# Patient Record
Sex: Female | Born: 1955 | Race: White | Hispanic: No | Marital: Single | Smoking: Current every day smoker
Health system: Southern US, Community
[De-identification: ages and names within clinical notes are randomized; demographics above are authoritative.]

## PROBLEM LIST (undated history)

## (undated) DIAGNOSIS — J45909 Unspecified asthma, uncomplicated: Secondary | ICD-10-CM

## (undated) DIAGNOSIS — J449 Chronic obstructive pulmonary disease, unspecified: Secondary | ICD-10-CM

## (undated) DIAGNOSIS — J309 Allergic rhinitis, unspecified: Secondary | ICD-10-CM

## (undated) DIAGNOSIS — I10 Essential (primary) hypertension: Secondary | ICD-10-CM

## (undated) HISTORY — DX: Unspecified asthma, uncomplicated: J45.909

## (undated) HISTORY — DX: Essential (primary) hypertension: I10

## (undated) HISTORY — DX: Allergic rhinitis, unspecified: J30.9

## (undated) HISTORY — DX: Chronic obstructive pulmonary disease, unspecified: J44.9

## (undated) HISTORY — PX: TONSILLECTOMY: SUR1361

## (undated) HISTORY — PX: UMBILICAL HERNIA REPAIR: SHX196

## (undated) HISTORY — PX: BACK SURGERY: SHX140

## (undated) HISTORY — PX: APPENDECTOMY: SHX54

## (undated) HISTORY — PX: DENTAL SURGERY: SHX609

## (undated) HISTORY — PX: ABDOMINAL HYSTERECTOMY: SUR658

---

## 2012-10-12 DIAGNOSIS — M542 Cervicalgia: Secondary | ICD-10-CM | POA: Insufficient documentation

## 2012-10-12 DIAGNOSIS — E785 Hyperlipidemia, unspecified: Secondary | ICD-10-CM | POA: Insufficient documentation

## 2012-10-12 DIAGNOSIS — R591 Generalized enlarged lymph nodes: Secondary | ICD-10-CM | POA: Insufficient documentation

## 2012-10-12 DIAGNOSIS — M25519 Pain in unspecified shoulder: Secondary | ICD-10-CM | POA: Insufficient documentation

## 2012-10-12 DIAGNOSIS — J439 Emphysema, unspecified: Secondary | ICD-10-CM | POA: Insufficient documentation

## 2013-05-28 DIAGNOSIS — M5137 Other intervertebral disc degeneration, lumbosacral region: Secondary | ICD-10-CM | POA: Insufficient documentation

## 2013-05-28 DIAGNOSIS — I10 Essential (primary) hypertension: Secondary | ICD-10-CM | POA: Insufficient documentation

## 2013-05-28 DIAGNOSIS — D839 Common variable immunodeficiency, unspecified: Secondary | ICD-10-CM | POA: Insufficient documentation

## 2013-08-19 DIAGNOSIS — J449 Chronic obstructive pulmonary disease, unspecified: Secondary | ICD-10-CM | POA: Insufficient documentation

## 2013-08-19 DIAGNOSIS — J45909 Unspecified asthma, uncomplicated: Secondary | ICD-10-CM | POA: Insufficient documentation

## 2014-01-27 DIAGNOSIS — M533 Sacrococcygeal disorders, not elsewhere classified: Secondary | ICD-10-CM | POA: Insufficient documentation

## 2014-11-25 DIAGNOSIS — M25562 Pain in left knee: Secondary | ICD-10-CM | POA: Insufficient documentation

## 2015-05-14 ENCOUNTER — Telehealth: Payer: Self-pay | Admitting: Pediatrics

## 2015-05-14 NOTE — Telephone Encounter (Signed)
Pt is having issues with breathing machine ordered for home. Needs to order a new one before this weekend hits. Please call pt back.

## 2015-06-04 ENCOUNTER — Other Ambulatory Visit: Payer: Self-pay

## 2015-06-04 MED ORDER — OLOPATADINE HCL 0.2 % OP SOLN: 1.0000 [drp] | OPHTHALMIC | Status: DC

## 2015-06-21 ENCOUNTER — Other Ambulatory Visit: Payer: Self-pay | Admitting: Allergy

## 2015-06-21 MED ORDER — AZELASTINE HCL 0.15 % NA SOLN: 1.0000 | NASAL | Status: DC

## 2015-07-01 ENCOUNTER — Other Ambulatory Visit: Payer: Self-pay | Admitting: Allergy

## 2015-07-01 MED ORDER — AZELASTINE HCL 0.15 % NA SOLN: 1.0000 | NASAL | Status: DC

## 2015-08-18 DIAGNOSIS — M5416 Radiculopathy, lumbar region: Secondary | ICD-10-CM | POA: Insufficient documentation

## 2015-12-20 ENCOUNTER — Telehealth: Payer: Self-pay | Admitting: Allergy

## 2015-12-20 NOTE — Telephone Encounter (Signed)
a 

## 2015-12-20 NOTE — Telephone Encounter (Signed)
[facility] CALLED AND SAID Abigaile NEEDED HER AZELASTINE NASAL SPRAY REFILLED. O.K.WITH ONE REFILL

## 2016-02-24 ENCOUNTER — Other Ambulatory Visit: Payer: Self-pay

## 2016-11-21 ENCOUNTER — Ambulatory Visit (INDEPENDENT_AMBULATORY_CARE_PROVIDER_SITE_OTHER): Payer: Medicaid Other | Admitting: Pediatrics

## 2016-11-21 ENCOUNTER — Encounter: Payer: Self-pay | Admitting: Pediatrics

## 2016-11-21 VITALS — BP 150/82 | HR 82 | Temp 97.8°F | Resp 16 | Ht 61.5 in | Wt 141.3 lb

## 2016-11-21 DIAGNOSIS — J411 Mucopurulent chronic bronchitis: Secondary | ICD-10-CM

## 2016-11-21 DIAGNOSIS — J310 Chronic rhinitis: Secondary | ICD-10-CM | POA: Diagnosis not present

## 2016-11-21 DIAGNOSIS — J410 Simple chronic bronchitis: Secondary | ICD-10-CM

## 2016-11-21 DIAGNOSIS — K219 Gastro-esophageal reflux disease without esophagitis: Secondary | ICD-10-CM | POA: Diagnosis not present

## 2016-11-21 DIAGNOSIS — Z720 Tobacco use: Secondary | ICD-10-CM | POA: Diagnosis not present

## 2016-11-21 DIAGNOSIS — D839 Common variable immunodeficiency, unspecified: Secondary | ICD-10-CM

## 2016-11-21 MED ORDER — CETIRIZINE HCL 10 MG PO TABS: ORAL_TABLET | ORAL | 5 refills | Status: DC

## 2016-11-21 MED ORDER — OLOPATADINE HCL 0.1 % OP SOLN: OPHTHALMIC | 5 refills | Status: DC

## 2016-11-21 MED ORDER — FLUTICASONE PROPIONATE 50 MCG/ACT NA SUSP: NASAL | 5 refills | Status: DC

## 2016-11-21 NOTE — Patient Instructions (Addendum)
Cetirizine 10 mg-take 1 tablet once a day for runny nose Fluticasone 2 sprays per nostril once a day for stuffy nose Patanol 1 drop twice a day if needed for itchy eyes Continue on your COPD medications prescribed by your  pulmonologist Hizentra 20%-infuse  7 grams  (35 ML) once a week Call me if you're allergic symptoms are not better Stop smoking cigarettes

## 2016-11-21 NOTE — Progress Notes (Signed)
[address] [place] [id number] Dept: [phone]  FOLLOW UP NOTE  Patient ID: Kristi Greathouseammy Allen, female    DOB: 03/18/1956  Age: 61 y.o. MRN: [id number] Date of Office Visit: 11/21/2016  Assessment  Chief Complaint: Asthma (chest tightness, coughing, some watery eyes and throat irritation)  HPI Carey Loder presents for follow-up of a COPD and low immunoglobulins.. She is followed by Dr. Eulis FosterBeauford for her COPD. At times she has nasal congestion and itchy eyes. Because of low immunoglobulins and recurrent ear infections, she has been on Hyzentra once a week and the recurrent  infections have improved dramatically. She has CVID  She continues to smoke cigarettes but is trying to quit  Current medications are outlined in the chart   Drug Allergies:  Allergies  Allergen Reactions  . Cyclobenzaprine   . Sulfamethoxazole-Trimethoprim Swelling  . Codeine Rash    Other reaction(s): RASH    Physical Exam: BP (!) 150/82   Pulse 82   Temp 97.8 F (36.6 C) (Oral)   Resp 16   Ht 5' 1.5" (1.562 m)   Wt 141 lb 5 oz (64.1 kg)   SpO2 96%   BMI 26.27 kg/m    Physical Exam  Constitutional: She is oriented to person, place, and time. She appears well-developed and well-nourished.  HENT:  Eyes normal. Ears normal. Nose mild swelling of the turbinates. Pharynx normal.  Neck: Neck supple.  Cardiovascular:  S1 and S2 normal no murmurs  Pulmonary/Chest:  Clear to percussion and auscultation except for mild wheezing on end expiration  Lymphadenopathy:    She has no cervical adenopathy.  Neurological: She is alert and oriented to person, place, and time.  Psychiatric: She has a normal mood and affect. Her behavior is normal. Judgment and thought content normal.  Vitals reviewed.   Diagnostics:  FVC 1.68 L FEV1 1.61 L. Predicted FVC 3.04 L predicted FEV1 2.25 L-this shows a moderate reduction in the forced vital capacity. There is no airway obstruction  Assessment and Plan: 1.  Mucopurulent chronic bronchitis (HCC)   2. Simple chronic bronchitis (HCC)   3. Common variable immunodeficiency (HCC)   4. Tobacco use   5. Chronic rhinitis   6. Gastroesophageal reflux disease without esophagitis     Meds ordered this encounter  Medications  . fluticasone (FLONASE) 50 MCG/ACT nasal spray    Sig: 2 sprays per nostril once a day for stuffy nose    Dispense:  18.2 g    Refill:  5  . olopatadine (PATANOL) 0.1 % ophthalmic solution    Sig: 1 drop twice a day if needed for itchy eyes    Dispense:  5 mL    Refill:  5  . cetirizine (ZYRTEC) 10 MG tablet    Sig: Take 1 tablet once a day for runny nose    Dispense:  34 tablet    Refill:  5    Patient Instructions  Cetirizine 10 mg-take 1 tablet once a day for runny nose Fluticasone 2 sprays per nostril once a day for stuffy nose Patanol 1 drop twice a day if needed for itchy eyes Continue on your COPD medications prescribed by your  pulmonologist Hizentra 20%-infuse  7 grams  (35 ML) once a week Call me if you're allergic symptoms are not better Stop smoking cigarettes   Return in about 1 year (around 11/21/2017).    Thank you for the opportunity to care for this patient.  Please do not hesitate to contact me with questions.  Penne Lash, M.D.  [facility] [address] [place], [place] [address] [phone]

## 2016-11-22 ENCOUNTER — Telehealth: Payer: Self-pay | Admitting: *Deleted

## 2016-11-22 NOTE — Telephone Encounter (Signed)
[facility] reached at [phone]

## 2016-11-22 NOTE — Telephone Encounter (Signed)
Per Dr. Beaulah DinningBardelas called [facility] and renewed her Hizentra 20% medication, 7 grams once a week with refills for 1 year and shipment is set for this Friday. Patient was informed of this.

## 2016-12-01 ENCOUNTER — Ambulatory Visit: Payer: Self-pay | Admitting: Allergy & Immunology

## 2017-05-14 ENCOUNTER — Other Ambulatory Visit: Payer: Self-pay | Admitting: Pediatrics

## 2017-07-18 ENCOUNTER — Institutional Professional Consult (permissible substitution): Payer: Self-pay | Admitting: Internal Medicine

## 2017-07-23 ENCOUNTER — Institutional Professional Consult (permissible substitution): Payer: Self-pay | Admitting: Pulmonary Disease

## 2017-08-09 ENCOUNTER — Encounter: Payer: Self-pay | Admitting: Pulmonary Disease

## 2017-08-09 ENCOUNTER — Ambulatory Visit (INDEPENDENT_AMBULATORY_CARE_PROVIDER_SITE_OTHER): Payer: Medicaid Other | Admitting: Pulmonary Disease

## 2017-08-09 VITALS — BP 138/78 | HR 86 | Ht 63.0 in | Wt 133.0 lb

## 2017-08-09 DIAGNOSIS — J479 Bronchiectasis, uncomplicated: Secondary | ICD-10-CM

## 2017-08-09 DIAGNOSIS — D839 Common variable immunodeficiency, unspecified: Secondary | ICD-10-CM | POA: Diagnosis not present

## 2017-08-09 DIAGNOSIS — J410 Simple chronic bronchitis: Secondary | ICD-10-CM | POA: Diagnosis not present

## 2017-08-09 DIAGNOSIS — J411 Mucopurulent chronic bronchitis: Secondary | ICD-10-CM | POA: Diagnosis not present

## 2017-08-09 MED ORDER — SODIUM CHLORIDE 3 % IN NEBU: INHALATION_SOLUTION | Freq: Two times a day (BID) | RESPIRATORY_TRACT | 11 refills | Status: DC

## 2017-08-09 MED ORDER — IPRATROPIUM-ALBUTEROL 20-100 MCG/ACT IN AERS: 1.0000 | INHALATION_SPRAY | Freq: Four times a day (QID) | RESPIRATORY_TRACT | 5 refills | Status: DC
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
## 2017-08-09 NOTE — Addendum Note (Signed)
Addended by: Velvet BatheAULFIELD, ASHLEY L on: 08/09/2017 02:48 PM   Modules accepted: Orders

## 2017-08-09 NOTE — Patient Instructions (Signed)
COPD-asthma overlap: Continue taking Symbicort 2 puffs twice a day Stop using the pro-air inhaler, stop using the Atrovent inhaler Start using Combivent 1 puff every 4-6 hours as needed for shortness of breath When you are at home you can substitute Combivent for the DuoNeb again, every 4-6 hours as needed for shortness of breath We will check blood work today to look for evidence of an underlying allergy (CBC with differential and serum IgE) We will check a chest x-ray today We will check lung function testing.  History of chronic variable immunodeficiency: Continue subcutaneous immunoglobulin supplementation We will check your mucus today for bacterial, fungal, AFB organism  Bronchiectasis: This term means that your airways are big, dilated, and susceptible to infection We are going to add hypertonic saline nebulized twice a day no matter how you feel to help you mobilize secretions.  Use this about 10 minutes after using the DuoNeb nebulizer  Cigarette smoker: Stop smoking Follow the lifestyle modification sheet we gave you. Call 1-800-quit-NOW to get free nicotine replacement from the state [place]  We will see you back in 6-8 weeks after the results of the culture are available

## 2017-08-13 ENCOUNTER — Telehealth: Payer: Self-pay | Admitting: Pulmonary Disease

## 2017-08-13 DIAGNOSIS — J410 Simple chronic bronchitis: Secondary | ICD-10-CM

## 2017-08-13 LAB — SPUTUM CYTOLOGY

## 2017-08-13 NOTE — Telephone Encounter (Signed)
LMTCB

## 2017-08-14 ENCOUNTER — Telehealth: Payer: Self-pay | Admitting: Pulmonary Disease

## 2017-08-14 DIAGNOSIS — J479 Bronchiectasis, uncomplicated: Secondary | ICD-10-CM

## 2017-08-14 MED ORDER — SODIUM CHLORIDE 3 % IN NEBU: INHALATION_SOLUTION | Freq: Two times a day (BID) | RESPIRATORY_TRACT | 11 refills | Status: DC

## 2017-08-14 NOTE — Telephone Encounter (Signed)
Patient called stating [facility] has had made her aware that they cannot provide nebulizer solution as well. Patient asks that we call her at [phone]. [facility] [address] [place]

## 2017-08-14 NOTE — Telephone Encounter (Signed)
Spoke with pt, she states she needs RX for hypertonic solution to go to [facility]. SHe also needed an order for a hose and filters for her nebulizer machine. Rx sent to the pharmacy and order placed to [facility]. Nothing further is needed.

## 2017-08-14 NOTE — Telephone Encounter (Signed)
Called Melissa from [facility] who stated to me pt received her last nebulizer from [facility].  Per Melissa, pt would have to go back to [facility] to receive her supplies for the nebulizer.  Melissa also stated to me the neb solution that was ordered for pt is one that is not supplied by [facility].  Attempted to call pt but no answer. Left message for pt to return our call x1.  Will await a call back from pt before we submit new orders for neb supplies to come from [facility] and also check with pt if she wants the neb sol to come from her pharmacy.

## 2017-08-15 NOTE — Telephone Encounter (Signed)
Patient returning call, [phone].

## 2017-08-16 ENCOUNTER — Telehealth: Payer: Self-pay | Admitting: Pulmonary Disease

## 2017-08-16 LAB — CBC WITH DIFFERENTIAL/PLATELET
BASOS ABS: 0 10*3/uL (ref 0.0–0.2)
Basos: 0 %
EOS (ABSOLUTE): 0.1 10*3/uL (ref 0.0–0.4)
EOS: 1 %
HEMATOCRIT: 43.8 % (ref 34.0–46.6)
HEMOGLOBIN: 14.6 g/dL (ref 11.1–15.9)
IMMATURE GRANS (ABS): 0 10*3/uL (ref 0.0–0.1)
Immature Granulocytes: 0 %
LYMPHS ABS: 2.1 10*3/uL (ref 0.7–3.1)
LYMPHS: 23 %
MCH: 30.1 pg (ref 26.6–33.0)
MCHC: 33.3 g/dL (ref 31.5–35.7)
MCV: 90 fL (ref 79–97)
MONOCYTES: 6 %
Monocytes Absolute: 0.6 10*3/uL (ref 0.1–0.9)
Neutrophils Absolute: 6.4 10*3/uL (ref 1.4–7.0)
Neutrophils: 70 %
Platelets: 348 10*3/uL (ref 150–379)
RBC: 4.85 x10E6/uL (ref 3.77–5.28)
RDW: 14 % (ref 12.3–15.4)
WBC: 9.3 10*3/uL (ref 3.4–10.8)

## 2017-08-16 LAB — IGE: IGE (IMMUNOGLOBULIN E), SERUM: 215 [IU]/mL (ref 6–495)

## 2017-08-16 MED ORDER — SODIUM CHLORIDE 3 % IN NEBU: INHALATION_SOLUTION | Freq: Two times a day (BID) | RESPIRATORY_TRACT | 11 refills | Status: DC

## 2017-08-16 NOTE — Telephone Encounter (Signed)
Called and spoke to patient. Let patient know that previous DME that provided nebulizer equipment was [facility] and that we would place order for that. Patient asked if hypertonic solution could be ordered so that it is delivered to her. Called and spoke to [facility]ist [facility] and was told they could mail patient the solution but it would take about a week to get to the patient. Patient stated that is fine with her and Rx [facility]. Nothing further is needed at this time.

## 2017-08-16 NOTE — Telephone Encounter (Signed)
Called and spoke with pt due to her returning [place] call.   Pt stated to me she called [facility] and was told by them they could not mail her nebulizer solution to her due to it being a liquid and she would have to pick it up.  Pt requested script to be sent to [facility] [place] due to it being closer to where she lives.  [facility]. Nothing further needed at this time.

## 2017-08-17 ENCOUNTER — Telehealth: Payer: Self-pay | Admitting: Pulmonary Disease

## 2017-08-17 ENCOUNTER — Other Ambulatory Visit: Payer: Self-pay

## 2017-08-17 MED ORDER — IPRATROPIUM BROMIDE 0.02 % IN SOLN: 1.0000 mg | Freq: Four times a day (QID) | RESPIRATORY_TRACT | 2 refills | Status: DC | PRN

## 2017-08-17 NOTE — Telephone Encounter (Signed)
Called and spoke with pt due to her returning [place] call.   Pt stated to me she called [facility] and was told by them they could not mail her nebulizer solution to her due to it being a liquid and she would have to pick it up.  Pt requested script [facility] be [facility] [place] due to it being closer to where she lives.  [facility]. Nothing further needed at this time.  Spoke with patient. She was returning Emily's call. Patient stated that she has been trying to get in touch with the pharmacy but they keep telling her that with her phone number, they do not have any RXs ready for her.   Advised patient that I would call [facility]. [facility] advised that they have 2 RXs for the patient.   Patient is aware. However, she needed the Atrovent solution to be called into [facility]. Advised her that I would send it in for her. She verbalized understanding. Nothing else needed at time of call.

## 2017-08-17 NOTE — Telephone Encounter (Signed)
Called and spoke with pt who stated to me she called [facility] yesterday and was told they had not received a Rx from our office for her.  I stated to pt I would call [facility] to see what the issue was and then call her back.  Called [facility] and spoke with Asher MuirJamie to see if an Rx had been received for pt and she stated to me an Rx was received today and they are in the process getting script ready for pt.    Called pt back to let her know this information.  Pt expressed understanding. Pt also stated to me she heard from [facility] and they are going to be delivering a new neb machine to her sometime between next Wednesday-Friday with new tubing which she hopes will work better for her than her current machine.  Stated to pt to call our office if anything else was needed. Nothing further needed at this time.

## 2017-08-21 ENCOUNTER — Telehealth: Payer: Self-pay | Admitting: Pulmonary Disease

## 2017-08-21 MED ORDER — BENZONATATE 100 MG PO CAPS: 100.0000 mg | ORAL_CAPSULE | Freq: Three times a day (TID) | ORAL | 2 refills | Status: DC | PRN

## 2017-08-21 NOTE — Telephone Encounter (Signed)
Called and spoke to pt.  Pt is requesting that Hypertonic be sent to [facility], as [facility] does not carry this medication.  Pt is also requesting Rx for Tessalon, as she has a persistent cough- prod with yellow mucus.  I have called [facility] to verify that they carry this medication and was advised that they do not.  Called pt back and made her aware of this information. Pt stated she would have to hold off on starting this medication, as [place] not drive [place] Rx.   BQ please advise on Rx for Tessalon.

## 2017-08-21 NOTE — Telephone Encounter (Signed)
OK to Rx tessalon 100 po tid prn cough, disp 45 refill 2

## 2017-08-21 NOTE — Telephone Encounter (Signed)
Rx for Tessalon 100 has been sent to preferred pharmacy. Pt is aware and voiced her understanding. Nothing further is needed.

## 2017-08-22 ENCOUNTER — Telehealth: Payer: Self-pay | Admitting: Pulmonary Disease

## 2017-08-22 NOTE — Telephone Encounter (Signed)
Attempted to contact [facility]. I did not receive an answer. There was no option for me to leave a message. Will try back.

## 2017-08-23 NOTE — Telephone Encounter (Signed)
Kim with [facility] returning call, [phone].

## 2017-08-23 NOTE — Telephone Encounter (Addendum)
I called [facility] but there was no answer and I left a detailed message advising them to return our call.   

## 2017-08-23 NOTE — Telephone Encounter (Signed)
I called [facility] but there was no answer and I left a detailed message advising them to return our call.

## 2017-08-26 LAB — FUNGUS CULTURE W SMEAR

## 2017-08-27 NOTE — Telephone Encounter (Signed)
[facility]. Pt's prescriptions have been updated. Nothing further was needed at this time.

## 2017-08-27 NOTE — Telephone Encounter (Signed)
[facility] returning call. Cb is [phone] before 12 [phone]. After 12 [phone]

## 2017-08-27 NOTE — Telephone Encounter (Signed)
Attempted to call [facility] I did not receive an answer. I have left a message for [facility] to return our call.

## 2017-09-24 LAB — AFB CULTURE WITH SMEAR (NOT AT ARMC)
ACID FAST CULTURE: NEGATIVE
ACID FAST SMEAR: NEGATIVE

## 2017-10-11 ENCOUNTER — Ambulatory Visit: Payer: Medicaid Other | Admitting: Pulmonary Disease

## 2017-10-11 ENCOUNTER — Encounter: Payer: Self-pay | Admitting: Pulmonary Disease

## 2017-10-11 ENCOUNTER — Telehealth: Payer: Self-pay | Admitting: Pulmonary Disease

## 2017-10-11 VITALS — BP 140/80 | HR 91 | Ht 62.0 in | Wt 131.0 lb

## 2017-10-11 DIAGNOSIS — D839 Common variable immunodeficiency, unspecified: Secondary | ICD-10-CM

## 2017-10-11 DIAGNOSIS — Z720 Tobacco use: Secondary | ICD-10-CM

## 2017-10-11 DIAGNOSIS — J471 Bronchiectasis with (acute) exacerbation: Secondary | ICD-10-CM

## 2017-10-11 DIAGNOSIS — J449 Chronic obstructive pulmonary disease, unspecified: Secondary | ICD-10-CM | POA: Diagnosis not present

## 2017-10-11 DIAGNOSIS — J411 Mucopurulent chronic bronchitis: Secondary | ICD-10-CM

## 2017-10-11 MED ORDER — BENZONATATE 200 MG PO CAPS: 200.0000 mg | ORAL_CAPSULE | Freq: Three times a day (TID) | ORAL | 2 refills | Status: DC | PRN

## 2017-10-11 MED ORDER — FLUTTER DEVI: 1.0000 | 0 refills | Status: DC

## 2017-10-11 MED ORDER — LORATADINE 10 MG PO TABS: 10.0000 mg | ORAL_TABLET | Freq: Every day | ORAL | 2 refills | Status: DC

## 2017-10-11 MED ORDER — AMOXICILLIN-POT CLAVULANATE 875-125 MG PO TABS: 1.0000 | ORAL_TABLET | Freq: Two times a day (BID) | ORAL | 0 refills | Status: DC

## 2017-10-11 NOTE — Patient Instructions (Signed)
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  The most significant problem is that you are continuing to smoke cigarettes. He desperately need to quit smoking Continue using Symbicort 2 puffs twice a day Use albuterol-ipratropium every 4-6 hours as needed for shortness of breath  Cigarette smoking: Stop smoking  Follow-up in 4 to 6 weeks to see how you are doing with the flutter valve, consider using a therapy vest if no improvement.

## 2017-10-11 NOTE — Progress Notes (Signed)
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  She continues to smoke cigarettes as of 08/2017.  She started smoking at age 62 and at one point she was smoking 4 packs per day, in April 2019 she was smoking 1.5-2 packs per day.  She took Chantix once that gave her severe mood swings so she stopped it.   She had been on allergy shots as recent as about 2017, this was complicated by "a severe reaction".  HPI  Chief Complaint  Patient presents with  . Follow-up    follow up for chronic bronchitis. patient complains of chronic cough with yellow mucus production. increased SOB. patient is still smoking 2 ppd   Still smoking 2 packs per day. Cough has worsened to brown mucus over the last few days.  Eliette says that the allergy medicine has helped her some but she is still having a lot of sinus congestion. She says that the allergy symptoms have been so bad that she has considered using benadryl as well.  She has been coughing and wheezing more lately because of this.  She has been coughing up mucus quite a bit and has been brining up thick mucus from her nose as well.  She has been using a lot of saline rinses.    [place].  She has been on allergy shots in the past but had to stop due a reaction to the immunotherapy.  She still sees them fairly regularly and last saw them a few weeks ago.    CVID: she is still taking the Ig infections weekly.   She still feels significant chest congestion and coughing up phlegm.  She has had this problem for years.  She hasn't picked up the hypertonic saline because she has panic attacks [place].  She hasn't tried a flutter.   Past Medical History:  Diagnosis Date  .  Asthma   . COPD (chronic obstructive pulmonary disease) (HCC)   . Hypertension        Review of Systems  Constitutional: Negative for fever and weight loss.  HENT: Positive for congestion. Negative for ear pain, nosebleeds and sore throat.   Eyes: Negative for redness.  Respiratory: Positive for cough, shortness of breath and wheezing.   Cardiovascular: Negative for palpitations, leg swelling and PND.  Gastrointestinal: Negative for nausea and vomiting.  Genitourinary: Negative for dysuria.  Skin: Negative for rash.  Neurological: Negative for headaches.  Endo/Heme/Allergies: Does not bruise/bleed easily.  Psychiatric/Behavioral: Negative for depression. The patient is not nervous/anxious.       Objective:  Physical Exam   Vitals:   10/11/17 1357  BP: 140/80  Pulse: 91  SpO2: 96%  Weight: 131 lb (59.4 kg)  Height: 5\' 2"  (1.575 m)    RA  Gen: chronically ill  appearing HENT: OP clear, TM's clear, neck supple PULM: Wheezing upper lobes bilaterally, normal percussion CV: RRR, no mgr, trace edema GI: BS+, soft, nontender Derm: no cyanosis or rash Psyche: normal mood and affect    CBC    Component Value Date/Time   WBC 9.3 08/09/2017 1450   RBC 4.85 08/09/2017 1450   HGB 14.6 08/09/2017 1450   HCT 43.8 08/09/2017 1450   PLT 348 08/09/2017 1450  MCV 90 08/09/2017 1450   MCH 30.1 08/09/2017 1450   MCHC 33.3 08/09/2017 1450   RDW 14.0 08/09/2017 1450   LYMPHSABS 2.1 08/09/2017 1450   EOSABS 0.1 08/09/2017 1450   BASOSABS 0.0 08/09/2017 1450     Chest imaging:  PFT: July [facility]: Normal appearing flow volume loop, FEV1 1.61 L 69% predicted 08/2017 spiro no airflow obstruction  Labs: 08/2017 CBC 100 absolute eos, IgE 215  Path: 08/2017 Sputum cytology negative   Micro: 08/2017 Sputum culture neg AFB; fungus: candida sp and penicillium  Echo:  Heart Catheterization:   Records from her visit with asthma  and allergy reviewed where she has been treated for COPD with chronic variable immunodeficiency with subcutaneous immunoglobulin    Assessment & Plan:   CVID (common variable immunodeficiency) (HCC)  Bronchiectasis with acute exacerbation (HCC) - Plan: CT CHEST HIGH RESOLUTION  Mucopurulent chronic bronchitis (HCC)  COPD with asthma (HCC)  Tobacco abuse  Discussion: Mekhia is experiencing another flare of her bronchiectasis.  She has been coughing up more brown mucus lately and has been feeling a bit more congested and short of breath.  On top of all this she is continuing to have significant sinus congestion and postnasal drip.  She unfortunately was unable to pick up the hypertonic saline.  She does not have a flutter valve.  I think ultimately she is going to need to use a therapy vest to help with mucociliary clearance but I like to try hypertonic saline as well as a flutter valve first.  We also need to get record of any imaging that has shown that she does not fact have bronchiectasis.  Plan: Bronchiectasis with acute exacerbation: Take Augmentin 875 1 tablet twice daily x7 days We will arrange for a high-resolution CT scan of your chest if we are unable to track down the results of the CT that showed you have bronchiectasis Try to have somebody take you into [place] to get the hypertonic saline prescription filled Use a flutter valve 10 breaths 3 times a day  Common variable immunodeficiency: Continue immunoglobulin supplementation by the asthma and allergy team  Allergic rhinitis: Switch from Zyrtec to Claritin 1 tablet daily Continue Singulair Continue nasal Flonase Continue saline rinses  COPD-asthma overlap syndrome: The blood work we did on the last visit did not show evidence of a severe allergy causing your problems.  The most significant problem is that you are continuing to smoke cigarettes. He desperately need to quit smoking Continue using Symbicort 2 puffs  twice a day Use albuterol-ipratropium every 4-6 hours as needed for shortness of breath  Cigarette smoking: Stop smoking  Follow-up in 4 to 6 weeks to see how you are doing with the flutter valve, consider using a therapy vest if no improvement.    Current Outpatient Medications:  .  albuterol (PROVENTIL HFA;VENTOLIN HFA) 108 (90 Base) MCG/ACT inhaler, Inhale 2 puffs into the lungs every 6 (six) hours as needed for wheezing or shortness of breath., Disp: , Rfl:  .  Azelastine HCl 0.15 % SOLN, Place 1 spray into both nostrils 1 day or 1 dose., Disp: 30 mL, Rfl: 3 .  benzonatate (TESSALON) 100 MG capsule, Take 1 capsule (100 mg total) by mouth 3 (three) times daily as needed for cough., Disp: 45 capsule, Rfl: 2 .  budesonide-formoterol (SYMBICORT) 160-4.5 MCG/ACT inhaler, Inhale 2 puffs into the lungs 2 (two) times daily., Disp: , Rfl:  .  cetirizine (ZYRTEC) 10 MG  tablet, Take 1 tablet once a day for runny nose, Disp: 34 tablet, Rfl: 5 .  fluticasone (FLONASE) 50 MCG/ACT nasal spray, 2 sprays per nostril once a day for stuffy nose, Disp: 18.2 g, Rfl: 5 .  hydrochlorothiazide (HYDRODIURIL) 25 MG tablet, Take 25 mg by mouth daily., Disp: , Rfl:  .  ipratropium (ATROVENT HFA) 17 MCG/ACT inhaler, Inhale 2 puffs into the lungs every 4 (four) hours as needed for wheezing., Disp: , Rfl:  .  ipratropium (ATROVENT) 0.02 % nebulizer solution, Inhale 5 mLs (1 mg total) into the lungs every 6 (six) hours as needed., Disp: 120 mL, Rfl: 2 .  Ipratropium-Albuterol (COMBIVENT RESPIMAT) 20-100 MCG/ACT AERS respimat, Inhale 1 puff into the lungs every 6 (six) hours., Disp: 4 g, Rfl: 5 .  loratadine (CLARITIN) 10 MG tablet, Take 10 mg by mouth daily., Disp: , Rfl:  .  montelukast (SINGULAIR) 10 MG tablet, Take 10 mg by mouth at bedtime., Disp: , Rfl:  .  nicotine (NICODERM CQ - DOSED IN MG/24 HOURS) 14 mg/24hr patch, Place 14 mg onto the skin daily., Disp: , Rfl:  .  olopatadine (PATANOL) 0.1 % ophthalmic  solution, PLACE 1 DROP IN EACH EYE 2 TIMES A DAY IF NEEDED FOR ITCHY EYES, Disp: 5 mL, Rfl: 4 .  Olopatadine HCl (PATADAY) 0.2 % SOLN, Place 1 drop into both eyes 1 day or 1 dose., Disp: 1 Bottle, Rfl: 5 .  omeprazole (PRILOSEC) 40 MG capsule, Take 40 mg by mouth daily., Disp: , Rfl:  .  simvastatin (ZOCOR) 80 MG tablet, Take 80 mg by mouth daily., Disp: , Rfl:  .  sodium chloride HYPERTONIC 3 % nebulizer solution, Take by nebulization 2 (two) times daily., Disp: 360 mL, Rfl: 11

## 2017-10-11 NOTE — Telephone Encounter (Signed)
Called and spoke to pt.  Pt stated tessalon 200mg  is too expensive. Per [facility]- Tessalon 100mg  is cheaper and can be written as 2 tabs q8h.  BQ please advise. Thanks

## 2017-10-12 MED ORDER — BENZONATATE 100 MG PO CAPS: ORAL_CAPSULE | ORAL | 2 refills | Status: DC

## 2017-10-12 NOTE — Telephone Encounter (Signed)
Pt is calling back. Pt also states she was switched Claritin but Claritin is no longer covered by her insurance. Cb is [phone].

## 2017-10-12 NOTE — Telephone Encounter (Signed)
Can take generic loratadine instead of claritin Tessalon 200 mg po q8h prn cough disp 45 refil 2

## 2017-10-12 NOTE — Telephone Encounter (Signed)
Per Dr. Kendrick FriesMcQuaid verbally- okay to do tessalon 100mg  2 tabs qh8 prn.  Rx for Tessalon 100mg  has been sent to preferred pharmacy.  Made pt aware of BQ's recommendations regarding generic Claritin.  Pt states she can not afford the generic either.  I have spoken with BQ verbally and made him aware, who suggested that if pt has a family member or friend that has a [employer], [employer] membership, she could get 300 tablets for $10.00. I also made pt aware that [facility] has 90 tablets for $5.00. Pt stated that neither one of these options were affordable for her either, and that she wished to stay on zyrtec.  routing to BQ as an FYI.

## 2017-10-12 NOTE — Telephone Encounter (Signed)
Pt is calling for update on tessalon dosage.  Pt also stated that claritin 10mg  is not covered by insurance and she can not afford this medication. She stated since Claritin is not affordable, she will continue with Zyrtec.   BQ please advise. Thanks.

## 2017-11-02 ENCOUNTER — Telehealth: Payer: Self-pay | Admitting: Pulmonary Disease

## 2017-11-02 ENCOUNTER — Other Ambulatory Visit: Payer: Self-pay | Admitting: Pulmonary Disease

## 2017-11-02 MED ORDER — IPRATROPIUM BROMIDE 0.02 % IN SOLN: RESPIRATORY_TRACT | 2 refills | Status: DC

## 2017-11-02 NOTE — Telephone Encounter (Signed)
Spoke with pt and advised rx for Atrovent sent to [facility]. Nothing further is needed.

## 2017-11-19 ENCOUNTER — Ambulatory Visit: Payer: Medicaid Other | Admitting: Pulmonary Disease

## 2017-11-20 ENCOUNTER — Ambulatory Visit: Payer: Medicaid Other | Admitting: Pediatrics

## 2017-11-21 ENCOUNTER — Telehealth: Payer: Self-pay | Admitting: Allergy

## 2017-11-21 ENCOUNTER — Other Ambulatory Visit: Payer: Self-pay | Admitting: Allergy

## 2017-11-21 NOTE — Telephone Encounter (Signed)
Done - error

## 2017-11-21 NOTE — Telephone Encounter (Signed)
Informed Kristi Allen I called in the Hizentra and to keep her appointment for Tuesday.

## 2017-11-21 NOTE — Telephone Encounter (Signed)
Talked with insurance company and the will ship the Hizentra  To [facility] for a month tomorrow.

## 2017-11-27 ENCOUNTER — Encounter: Payer: Self-pay | Admitting: Pediatrics

## 2017-11-27 ENCOUNTER — Ambulatory Visit: Payer: Medicaid Other | Admitting: Pediatrics

## 2017-11-27 VITALS — BP 152/78 | HR 86 | Temp 97.7°F | Resp 20 | Ht 61.0 in | Wt 128.0 lb

## 2017-11-27 DIAGNOSIS — K219 Gastro-esophageal reflux disease without esophagitis: Secondary | ICD-10-CM

## 2017-11-27 DIAGNOSIS — J4541 Moderate persistent asthma with (acute) exacerbation: Secondary | ICD-10-CM | POA: Diagnosis not present

## 2017-11-27 DIAGNOSIS — J411 Mucopurulent chronic bronchitis: Secondary | ICD-10-CM | POA: Diagnosis not present

## 2017-11-27 DIAGNOSIS — J0100 Acute maxillary sinusitis, unspecified: Secondary | ICD-10-CM | POA: Insufficient documentation

## 2017-11-27 DIAGNOSIS — Z720 Tobacco use: Secondary | ICD-10-CM | POA: Diagnosis not present

## 2017-11-27 DIAGNOSIS — D839 Common variable immunodeficiency, unspecified: Secondary | ICD-10-CM | POA: Diagnosis not present

## 2017-11-27 DIAGNOSIS — J310 Chronic rhinitis: Secondary | ICD-10-CM | POA: Insufficient documentation

## 2017-11-27 MED ORDER — PREDNISONE 10 MG PO TABS: ORAL_TABLET | ORAL | 0 refills | Status: DC

## 2017-11-27 MED ORDER — AMOXICILLIN-POT CLAVULANATE 875-125 MG PO TABS: ORAL_TABLET | ORAL | 0 refills | Status: DC

## 2017-11-27 MED ORDER — IPRATROPIUM BROMIDE 0.02 % IN SOLN: RESPIRATORY_TRACT | 2 refills | Status: DC

## 2017-11-27 NOTE — Patient Instructions (Addendum)
Cetirizine 10 mg-take 1 tablet once a day for runny nose Fluticasone 2 sprays per nostril after doing nasal saline irrigations at night Symbicort 160 - 2 puffs every 12 hours to prevent coughing or wheezing Ipratropium 0.02% 1 unit dose every 4 hours to help her breathing Pro-air 2 puffs every 4 hours if needed for wheezing or coughing spells or instead albuterol 0.083% 1 unit dose every 4 hours if needed Augmentin 875 mg-take 1 tablet every 12 hours for 10 days for a sinus infection Add prednisone 10 mg twice a day for 4 days, 10 mg on the fifth day Hizentra 20%- 7 grams once a week . Next dose tomorrow Continue on your other medications Stop smoking cigarettes  She will have serum immunoglobulins and IgG subclasses done today Continue your follow-up visits with your pulmonologist

## 2017-11-27 NOTE — Progress Notes (Signed)
[address] [place] [address] Dept: [phone]  FOLLOW UP NOTE  Patient ID: Kristi Allen, female    DOB: 09/04/1955  Age: 62 y.o. MRN: [id number] Date of Office Visit: 11/27/2017  Assessment  Chief Complaint: COPD (SOB.  Pt now seeing Dr. Max Fickleouglas McQuaid, a pulmonologist in [place]) and Allergic Rhinitis  (PND)  HPI Kristi Allen presents for follow-up of COPD with some reactive airways .  She is followed by pulmonologist in [place].  She has common variable immunodeficiency and is on Hizentra 7 g once a week.  She needs a new prescription to use for the next year.  She has improved with the use of Hizentra.  She has had a discolored postnasal drainage and more coughing over the past 2 weeks.  Over the past year she has noted more sneezing and nasal congestion.  She has not had allergy testing for several years  Current medications are outlined in the chart   Drug Allergies:  Allergies  Allergen Reactions  . Cyclobenzaprine   . Hydrocortisone     Breaks out in hives with topical cream.   . Sulfamethoxazole-Trimethoprim Swelling  . Codeine Rash    Other reaction(s): RASH    Physical Exam: BP (!) 152/78 (BP Location: Left Arm, Patient Position: Sitting, Cuff Size: Normal)   Pulse 86   Temp 97.7 F (36.5 C) (Oral)   Resp 20   Ht 5\' 1"  (1.549 m)   Wt 128 lb (58.1 kg)   SpO2 97%   BMI 24.19 kg/m    Physical Exam  Constitutional: She is oriented to person, place, and time. She appears well-developed and well-nourished.  HENT:  Eyes normal.  Ears normal..  Nose mild swelling of the  nasal turbinates.  Pharynx normal except for yellow postnasal drainage  Neck: Neck supple.  Cardiovascular:  S1 S2 normal no murmurs  Pulmonary/Chest:  Clear to percussion and auscultation except for some rhonchi in the left lung  Lymphadenopathy:    She has no cervical adenopathy.  Neurological: She is alert and oriented to person, place, and time.  Psychiatric: She has  a normal mood and affect. Her behavior is normal. Judgment and thought content normal.  Vitals reviewed.   Diagnostics: FVC 1.84 L FEV1 1.54 L.  Predicted FVC 2.92 L predicted FEV1 2.25 L.  After albuterol by nebulization FVC 2.10 L FEV1 1.80 L-this shows a moderate reduction in the forced vital capacity and FEV1 with significant improvement after albuterol  Assessment and Plan: 1. CVID (common variable immunodeficiency) (HCC)   2. Mucopurulent chronic bronchitis (HCC)   3. Moderate persistent asthma with acute exacerbation   4. Tobacco use   5. Chronic rhinitis   6. Gastroesophageal reflux disease without esophagitis   7. Acute non-recurrent maxillary sinusitis     Meds ordered this encounter  Medications  . ipratropium (ATROVENT) 0.02 % nebulizer solution    Sig: One unit dose in nebulizer every 4 hours as needed for cough or wheeze.    Dispense:  300 mL    Refill:  2    Dispense 120 unit vials  . amoxicillin-clavulanate (AUGMENTIN) 875-125 MG tablet    Sig: One tablet every 12 hours for 10 days for infection.    Dispense:  20 tablet    Refill:  0  . predniSONE (DELTASONE) 10 MG tablet    Sig: One tablet twice a day for 4 days, then one tablet on day 5, then stop.    Dispense:  9 tablet  Refill:  0    Patient Instructions  Cetirizine 10 mg-take 1 tablet once a day for runny nose Fluticasone 2 sprays per nostril after doing nasal saline irrigations at night Symbicort 160 - 2 puffs every 12 hours to prevent coughing or wheezing Ipratropium 0.02% 1 unit dose every 4 hours to help her breathing Pro-air 2 puffs every 4 hours if needed for wheezing or coughing spells or instead albuterol 0.083% 1 unit dose every 4 hours if needed Augmentin 875 mg-take 1 tablet every 12 hours for 10 days for a sinus infection Add prednisone 10 mg twice a day for 4 days, 10 mg on the fifth day Hizentra 20%- 7 grams once a week . Next dose tomorrow Continue on your other medications Stop  smoking cigarettes  She will have serum immunoglobulins and IgG subclasses done today Continue your follow-up visits with your pulmonologist   Return in about 1 year (around 11/28/2018).    Thank you for the opportunity to care for this patient.  Please do not hesitate to contact me with questions.  Tonette Bihari, M.D.  [facility] [address] [place], [place] [address] [phone]

## 2017-11-29 ENCOUNTER — Ambulatory Visit: Payer: Medicaid Other | Admitting: Pulmonary Disease

## 2017-11-29 LAB — IGG, IGA, IGM
IgA/Immunoglobulin A, Serum: 119 mg/dL (ref 87–352)
IgM (Immunoglobulin M), Srm: 47 mg/dL (ref 26–217)

## 2017-11-29 LAB — IGG 1, 2, 3, AND 4
IGG 4: 12 mg/dL (ref 2–96)
IGG, SUBCLASS 3: 27 mg/dL (ref 15–102)
IgG (Immunoglobin G), Serum: 986 mg/dL (ref 700–1600)
IgG, Subclass 1: 572 mg/dL (ref 248–810)
IgG, Subclass 2: 279 mg/dL (ref 130–555)

## 2017-12-03 ENCOUNTER — Other Ambulatory Visit: Payer: Self-pay

## 2017-12-12 ENCOUNTER — Encounter (HOSPITAL_BASED_OUTPATIENT_CLINIC_OR_DEPARTMENT_OTHER): Payer: Self-pay | Admitting: Radiology

## 2017-12-12 ENCOUNTER — Ambulatory Visit (HOSPITAL_BASED_OUTPATIENT_CLINIC_OR_DEPARTMENT_OTHER)
Admission: RE | Admit: 2017-12-12 | Discharge: 2017-12-12 | Disposition: A | Discharge status: Home / Self Care | Payer: Medicaid Other | Source: Ambulatory Visit | Attending: Pulmonary Disease | Admitting: Pulmonary Disease

## 2017-12-12 DIAGNOSIS — I059 Rheumatic mitral valve disease, unspecified: Secondary | ICD-10-CM | POA: Insufficient documentation

## 2017-12-12 DIAGNOSIS — I2510 Atherosclerotic heart disease of native coronary artery without angina pectoris: Secondary | ICD-10-CM | POA: Insufficient documentation

## 2017-12-12 DIAGNOSIS — J471 Bronchiectasis with (acute) exacerbation: Secondary | ICD-10-CM | POA: Diagnosis not present

## 2017-12-12 DIAGNOSIS — I700 Atherosclerosis of aorta: Secondary | ICD-10-CM | POA: Diagnosis not present

## 2017-12-12 DIAGNOSIS — R918 Other nonspecific abnormal finding of lung field: Secondary | ICD-10-CM | POA: Insufficient documentation

## 2017-12-13 ENCOUNTER — Ambulatory Visit (INDEPENDENT_AMBULATORY_CARE_PROVIDER_SITE_OTHER): Payer: Medicaid Other | Admitting: Pulmonary Disease

## 2017-12-13 ENCOUNTER — Telehealth: Payer: Self-pay | Admitting: Pulmonary Disease

## 2017-12-13 ENCOUNTER — Encounter: Payer: Self-pay | Admitting: Pulmonary Disease

## 2017-12-13 VITALS — BP 140/80 | HR 83 | Ht 62.0 in | Wt 127.0 lb

## 2017-12-13 DIAGNOSIS — D839 Common variable immunodeficiency, unspecified: Secondary | ICD-10-CM | POA: Diagnosis not present

## 2017-12-13 DIAGNOSIS — J449 Chronic obstructive pulmonary disease, unspecified: Secondary | ICD-10-CM

## 2017-12-13 DIAGNOSIS — J471 Bronchiectasis with (acute) exacerbation: Secondary | ICD-10-CM

## 2017-12-13 DIAGNOSIS — R911 Solitary pulmonary nodule: Secondary | ICD-10-CM

## 2017-12-13 MED ORDER — IPRATROPIUM-ALBUTEROL 0.5-2.5 (3) MG/3ML IN SOLN: 3.0000 mL | Freq: Four times a day (QID) | RESPIRATORY_TRACT | 5 refills | Status: DC | PRN

## 2017-12-13 NOTE — Addendum Note (Signed)
Addended by: Etheleen MayhewOX, HEATHER C on: 12/13/2017 03:11 PM   Modules accepted: Orders

## 2017-12-13 NOTE — Telephone Encounter (Signed)
Spoke to patient, she had an appointment with [place]. Kendrick [place]aid today [place] where results were reviewed. Nothing further needed.

## 2017-12-13 NOTE — Progress Notes (Signed)
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  She continues to smoke cigarettes as of 08/2017.  She started smoking at age 89 and at one point she was smoking 4 packs per day, in April 2019 she was smoking 1.5-2 packs per day.  She took Chantix once that gave her severe mood swings so she stopped it.   She had been on allergy shots as recent as about 2017, this was complicated by "a severe reaction".  HPI  Chief Complaint  Patient presents with  . Follow-up    shortness of breath that causes anxiety attacks. cough   Kristi Allen says that she had a rough morning.  She had a skin cancer removed and had some stitches removed this morning and that area hurts.  She says that she has been having some panic attacks which make her short of breath. She says that she sometimes gets chest tightness when she takes the anxiety attackes.    She says that she recently had a sinus infection and is taking augmentin.  She has been using combivent and it helps "a whole lot".  She is using it about every 6 hours.  She does produce mucus when she uses the flutter valve. She has been using it regularly.  She says that the hypertonic saline really makes her breathe better.    She is still taking symbicort regularly.    She says that she is still smoking: her anxiety is contributing.   Past Medical History:  Diagnosis Date  . Allergic rhinitis   . Asthma   . COPD (chronic obstructive pulmonary disease) (HCC)   . Hypertension        Review of Systems  Constitutional: Negative for fever and weight loss.  HENT: Positive for congestion. Negative for ear pain, nosebleeds and sore throat.   Eyes: Negative for redness.  Respiratory: Positive for cough, shortness of breath and wheezing.     Cardiovascular: Negative for palpitations, leg swelling and PND.  Gastrointestinal: Negative for nausea and vomiting.  Genitourinary: Negative for dysuria.  Skin: Negative for rash.  Neurological: Negative for headaches.  Endo/Heme/Allergies: Does not bruise/bleed easily.  Psychiatric/Behavioral: Negative for depression. The patient is not nervous/anxious.       Objective:  Physical Exam   Vitals:   12/13/17 1435  BP: 140/80  Pulse: 83  SpO2: 97%  Weight: 127 lb (57.6 kg)  Height: 5\' 2"  (1.575 m)    RA  Gen: well appearing HENT: OP clear, TM's clear, neck supple PULM: Rhonchi bases bilaterally B, normal percussion CV: RRR, no mgr, trace edema GI: BS+, soft, nontender Derm: no cyanosis or rash Psyche: normal mood and affect     CBC    Component Value Date/Time   WBC 9.3 08/09/2017 1450   RBC 4.85 08/09/2017 1450   HGB 14.6 08/09/2017 1450   HCT 43.8 08/09/2017 1450   PLT 348 08/09/2017 1450   MCV 90 08/09/2017 1450   MCH 30.1 08/09/2017 1450   MCHC 33.3 08/09/2017 1450   RDW 14.0 08/09/2017 1450   LYMPHSABS 2.1 08/09/2017 1450   EOSABS 0.1 08/09/2017 1450   BASOSABS 0.0 08/09/2017 1450     Chest imaging: December 12, 2017 high-resolution CT scan of the chest images independently reviewed showing normal pulmonary parenchyma, scattered pulmonary nodules,  2 to 3 mm in size, perhaps very mild airway enlargement in the bases suggestive of bronchiectasis  PFT: July [facility]: Normal appearing flow volume loop, FEV1 1.61 L 69% predicted 08/2017 spiro no airflow obstruction  Labs: 08/2017 CBC 100 absolute eos, IgE 215  Path: 08/2017 Sputum cytology negative   Micro: 08/2017 Sputum culture neg AFB; fungus: candida sp and penicillium  Echo:  Heart Catheterization:       Assessment & Plan:   CVID (common variable immunodeficiency) (HCC)  Solitary pulmonary nodule - Plan: CT Chest Wo Contrast  Bronchiectasis  with acute exacerbation (HCC)  COPD with asthma (HCC)  Pulmonary nodule  Discussion: [name] says that she feels better after she has been doing the nebulized breathing treatments, I think she is referring mostly to the DuoNeb but I have encouraged her to continue pulmonary toilet measures with the flutter valve and the hypertonic saline.  Unfortunately she continues to smoke.  She needs to quit desperately.  These 2 small nodules in her lungs are not terribly worrisome given their size but considering her smoking history they need to be followed.  Honestly, I did not see dramatic bronchiectatic changes, but considering her clinical scenario with common variable immunodeficiency I think is very reasonable to continue treating as if she has it.  Plan: Common variable immunodeficiency with bronchiectasis: Keep follow-up with the immunologist Continue flutter valve 4 to 5 breaths, 4-5 times a day Continue hypertonic saline nebulized twice a day Use the DuoNeb 3 times a day  COPD-asthma overlap syndrome: Stop smoking Stop smoking Stop smoking Use the Symbicort 2 puffs twice a day no matter how you feel  Cigarette smoking: Stop smoking  Pulmonary nodule: We will repeat a CT scan of your chest in 12 months or sooner if needed  Follow up here in 4 months     Current Outpatient Medications:  .  albuterol (PROVENTIL HFA;VENTOLIN HFA) 108 (90 Base) MCG/ACT inhaler, Inhale 2 puffs into the lungs every 6 (six) hours as needed for wheezing or shortness of breath., Disp: , Rfl:  .  amoxicillin-clavulanate (AUGMENTIN) 875-125 MG tablet, One tablet every 12 hours for 10 days for infection., Disp: 20 tablet, Rfl: 0 .  Azelastine HCl 0.15 % SOLN, Place 1 spray into both nostrils 1 day or 1 dose., Disp: 30 mL, Rfl: 3 .  benzonatate (TESSALON) 100 MG capsule, Take 2 tabs every 8 hr as needed., Disp: 45 capsule, Rfl: 2 .  budesonide-formoterol (SYMBICORT) 160-4.5 MCG/ACT inhaler, Inhale 2 puffs into  the lungs 2 (two) times daily., Disp: , Rfl:  .  cetirizine (ZYRTEC) 10 MG tablet, Take 1 tablet once a day for runny nose, Disp: 34 tablet, Rfl: 5 .  diazepam (VALIUM) 5 MG tablet, TAKE 1 TO 2 TABLETS BY MOUTH 2 HOURS BEFORE AND AFTER SURGERY, Disp: , Rfl: 0 .  fluticasone (FLONASE) 50 MCG/ACT nasal spray, 2 sprays per nostril once a day for stuffy nose, Disp: 18.2 g, Rfl: 5 .  hydrochlorothiazide (HYDRODIURIL) 25 MG tablet, Take 25 mg by mouth daily., Disp: , Rfl:  .  Immune Globulin, Human, (HIZENTRA [place]), Inject 7 g into the skin once a week. Hizentra 20%  7 grams (35 ml) one time every week., Disp: , Rfl:  .  Ipratropium-Albuterol (COMBIVENT RESPIMAT) 20-100 MCG/ACT AERS respimat, Inhale 1 puff into the lungs every 6 (six) hours., Disp: 4 g, Rfl: 5 .  loratadine (CLARITIN) 10 MG tablet, Take 1 tablet (10 mg total)  by mouth daily., Disp: 30 tablet, Rfl: 2 .  nicotine (NICODERM CQ - DOSED IN MG/24 HOURS) 14 mg/24hr patch, Place 14 mg onto the skin daily., Disp: , Rfl:  .  olopatadine (PATANOL) 0.1 % ophthalmic solution, PLACE 1 DROP IN EACH EYE 2 TIMES A DAY IF NEEDED FOR ITCHY EYES, Disp: 5 mL, Rfl: 4 .  Olopatadine HCl (PATADAY) 0.2 % SOLN, Place 1 drop into both eyes 1 day or 1 dose., Disp: 1 Bottle, Rfl: 5 .  omeprazole (PRILOSEC) 40 MG capsule, Take 40 mg by mouth daily., Disp: , Rfl:  .  predniSONE (DELTASONE) 10 MG tablet, One tablet twice a day for 4 days, then one tablet on day 5, then stop., Disp: 9 tablet, Rfl: 0 .  Respiratory Therapy Supplies (FLUTTER) DEVI, 1 Device by Does not apply route as directed., Disp: 1 each, Rfl: 0 .  simvastatin (ZOCOR) 80 MG tablet, Take 80 mg by mouth daily., Disp: , Rfl:  .  sodium chloride HYPERTONIC 3 % nebulizer solution, Take by nebulization 2 (two) times daily., Disp: 360 mL, Rfl: 11 .  traMADol (ULTRAM) 50 MG tablet, Take 50 mg by mouth every 6 (six) hours as needed. for pain, Disp: , Rfl: 0 .  ipratropium-albuterol (DUONEB) 0.5-2.5 (3) MG/3ML  SOLN, Take 3 mLs by nebulization every 6 (six) hours as needed., Disp: 360 mL, Rfl: 5

## 2017-12-13 NOTE — Patient Instructions (Signed)
Common variable immunodeficiency with bronchiectasis: Keep follow-up with the immunologist Continue flutter valve 4 to 5 breaths, 4-5 times a day Continue hypertonic saline nebulized twice a day Use the DuoNeb 3 times a day  COPD-asthma overlap syndrome: Stop smoking Stop smoking Stop smoking Use the Symbicort 2 puffs twice a day no matter how you feel  Cigarette smoking: Stop smoking  Pulmonary nodule: We will repeat a CT scan of your chest in 12 months or sooner if needed  Follow up here in 4 months

## 2018-02-01 ENCOUNTER — Other Ambulatory Visit: Payer: Self-pay | Admitting: Pulmonary Disease

## 2018-03-01 ENCOUNTER — Telehealth: Payer: Self-pay

## 2018-03-01 NOTE — Telephone Encounter (Signed)
Joe from [facility] calling to let us know that Kristi Allen will be receiving her infusion just a  few days later than usual bc she had her flu shot the latter part of last week or early part of this week. She then had some skin cancer removed from off her leg so this week she didn't feel up to receiving her infusion. Joe from [facility] thought that Dr. Beaulah Dinning should be aware of this.

## 2018-03-04 NOTE — Telephone Encounter (Signed)
Thanks for letting me know!

## 2018-04-23 ENCOUNTER — Ambulatory Visit (INDEPENDENT_AMBULATORY_CARE_PROVIDER_SITE_OTHER): Payer: Medicaid Other | Admitting: Pulmonary Disease

## 2018-04-23 ENCOUNTER — Encounter: Payer: Self-pay | Admitting: Pulmonary Disease

## 2018-04-23 ENCOUNTER — Other Ambulatory Visit: Payer: Self-pay | Admitting: Pulmonary Disease

## 2018-04-23 ENCOUNTER — Ambulatory Visit: Payer: Medicaid Other | Admitting: Pulmonary Disease

## 2018-04-23 VITALS — BP 143/72 | HR 78 | Ht 62.0 in | Wt 131.0 lb

## 2018-04-23 DIAGNOSIS — J449 Chronic obstructive pulmonary disease, unspecified: Secondary | ICD-10-CM

## 2018-04-23 DIAGNOSIS — J471 Bronchiectasis with (acute) exacerbation: Secondary | ICD-10-CM

## 2018-04-23 DIAGNOSIS — Z720 Tobacco use: Secondary | ICD-10-CM

## 2018-04-23 DIAGNOSIS — F17210 Nicotine dependence, cigarettes, uncomplicated: Secondary | ICD-10-CM

## 2018-04-23 MED ORDER — PREDNISONE 20 MG PO TABS: 20.0000 mg | ORAL_TABLET | Freq: Every day | ORAL | 0 refills | Status: DC

## 2018-04-23 MED ORDER — NICOTINE 10 MG IN INHA: 1.0000 | RESPIRATORY_TRACT | 0 refills | Status: DC | PRN

## 2018-04-23 NOTE — Patient Instructions (Signed)
COPD-asthma overlap syndrome with acute exacerbation: Finish the antibiotics prescribed by your primary care physician Take prednisone 20 mg daily x5 days Continue using albuterol as needed for chest tightness, wheezing, shortness of breath Continue taking Symbicort daily  It is important to stop smoking right away.  However, if you continue to have recurrent exacerbations we may need to consider putting you on azithromycin every day.  Tobacco abuse: Stop smoking We will prescribe a Nicotrol inhaler to help you quit smoking  We will see you back in 2 to 3 months or sooner if needed

## 2018-04-23 NOTE — Progress Notes (Signed)
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  She continues to smoke cigarettes as of 08/2017.  She started smoking at age 62 and at one point she was smoking 4 packs per day, in April 2019 she was smoking 1.5-2 packs per day.  She took Chantix once that gave her severe mood swings so she stopped it.   She had been on allergy shots as recent as about 2017, this was complicated by "a severe reaction".  HPI  Chief Complaint  Patient presents with  . Follow-up    Pt has some chest tightness, some sinus troubles, prod. cough-yellow thick with post nasal drip.   Last week Ferris was coughing up some mucus and had a bad cough and sinus congestion.  She had more dyspnea and wheezing at the time.  She took an antibiotics called in by her PCP and she now feels better.  She is taking amoxicillin right now for that.  She says that she has needed some antibiotics for an infected skin biopsy.  She is still getting her IVIg infusions.   She is still wheezing despite the antibiotics.  She is still smoking cigarettes  She has never been on continuous antibiotics.    Past Medical History:  Diagnosis Date  . Allergic rhinitis   . Asthma   . COPD (chronic obstructive pulmonary disease) (HCC)   . Hypertension        Review of Systems  Constitutional: Negative for fever and weight loss.  HENT: Positive for congestion. Negative for ear pain, nosebleeds and sore throat.   Eyes: Negative for redness.  Respiratory: Positive for cough, shortness of breath and wheezing.   Cardiovascular: Negative for palpitations, leg swelling and PND.  Gastrointestinal: Negative for nausea and vomiting.  Genitourinary: Negative for dysuria.  Skin: Negative for rash.  Neurological: Negative for  headaches.  Endo/Heme/Allergies: Does not bruise/bleed easily.  Psychiatric/Behavioral: Negative for depression. The patient is not nervous/anxious.       Objective:  Physical Exam   Vitals:   04/23/18 1414  BP: (!) 143/72  Pulse: 78  SpO2: 100%  Weight: 131 lb (59.4 kg)  Height: 5\' 2"  (1.575 m)    RA  Gen: chronically ill appearing HENT: OP clear, TM's clear, neck supple PULM: wheezing bilateraly, normal percussion CV: RRR, no mgr, trace edema GI: BS+, soft, nontender Derm: no cyanosis or rash Psyche: normal mood and affect      CBC    Component Value Date/Time   WBC 9.3 08/09/2017 1450   RBC 4.85 08/09/2017 1450   HGB 14.6 08/09/2017 1450   HCT 43.8 08/09/2017 1450   PLT 348 08/09/2017 1450   MCV 90 08/09/2017 1450   MCH 30.1 08/09/2017 1450   MCHC 33.3 08/09/2017 1450   RDW 14.0 08/09/2017 1450   LYMPHSABS 2.1 08/09/2017 1450   EOSABS 0.1 08/09/2017 1450   BASOSABS 0.0 08/09/2017 1450     Chest imaging: December 12, 2017 high-resolution CT scan of the chest images independently reviewed showing normal pulmonary parenchyma, scattered pulmonary nodules, 2 to 3 mm in size, perhaps very mild airway enlargement in the bases suggestive of bronchiectasis  PFT: July 2018 [facility]: Normal appearing flow volume loop, FEV1 1.61 L 69% predicted 08/2017 spiro no airflow obstruction  Labs: 08/2017 CBC 100 absolute eos, IgE 215  Path: 08/2017 Sputum cytology negative   Micro: 08/2017 Sputum culture neg AFB; fungus: candida sp and penicillium  Echo:  Heart Catheterization:       Assessment & Plan:   Bronchiectasis with acute exacerbation (HCC)  COPD with asthma (HCC)  Tobacco abuse  Discussion: Jolinda says that she is still smoking cigarettes and still having an exacerbation of COPD despite taking Augmentin recently.  She says she is still wheezing.  Today we talked about the fact that given the frequency of  exacerbations I would normally consider to continuous antibiotics such as azithromycin.  However, given her ongoing tobacco use it is hard to consider doing this because I believe that is the source of her recurrent exacerbations of COPD-asthma.  We talked about various ways to quit smoking today for 5 minutes.  She says that her anxiety is her major barrier to quitting.  She says that she was able to quit briefly in the past but anxiety brought her back.  She is not interested in using an electronic cigarette.  She is interested in trying a Nicotrol inhaler after we talked about various treatment options  Plan: COPD-asthma overlap syndrome with acute exacerbation: Finish the antibiotics prescribed by your primary care physician Take prednisone 20 mg daily x5 days Continue using albuterol as needed for chest tightness, wheezing, shortness of breath Continue taking Symbicort daily  It is important to stop smoking right away.  However, if you continue to have recurrent exacerbations we may need to consider putting you on azithromycin every day.  Tobacco abuse: Stop smoking We will prescribe a Nicotrol inhaler to help you quit smoking  We will see you back in 2 to 3 months or sooner if needed     Current Outpatient Medications:  .  albuterol (PROVENTIL HFA;VENTOLIN HFA) 108 (90 Base) MCG/ACT inhaler, Inhale 2 puffs into the lungs every 6 (six) hours as needed for wheezing or shortness of breath., Disp: , Rfl:  .  amoxicillin-clavulanate (AUGMENTIN) 875-125 MG tablet, One tablet every 12 hours for 10 days for infection., Disp: 20 tablet, Rfl: 0 .  Azelastine HCl 0.15 % SOLN, Place 1 spray into both nostrils 1 day or 1 dose., Disp: 30 mL, Rfl: 3 .  benzonatate (TESSALON) 100 MG capsule, Take 2 tabs every 8 hr as needed., Disp: 45 capsule, Rfl: 2 .  budesonide-formoterol (SYMBICORT) 160-4.5 MCG/ACT inhaler, Inhale 2 puffs into the lungs 2 (two) times daily., Disp: , Rfl:  .  cetirizine (ZYRTEC)  10 MG tablet, Take 1 tablet once a day for runny nose, Disp: 34 tablet, Rfl: 5 .  COMBIVENT RESPIMAT 20-100 MCG/ACT AERS respimat, INHALE 1 PUFF INTO THE LUNGS EVERY 6 HOURS, Disp: 4 g, Rfl: 5 .  diazepam (VALIUM) 5 MG tablet, TAKE 1 TO 2 TABLETS BY MOUTH 2 HOURS BEFORE AND AFTER SURGERY, Disp: , Rfl: 0 .  fluticasone (FLONASE) 50 MCG/ACT nasal spray, 2 sprays per nostril once a day for stuffy nose, Disp: 18.2 g, Rfl: 5 .  hydrochlorothiazide (HYDRODIURIL) 25 MG tablet, Take 25 mg by mouth daily., Disp: , Rfl:  .  Immune Globulin, Human, (HIZENTRA ), Inject 7 g into the skin once a week. Hizentra 20%  7 grams (35 ml) one time every week., Disp: , Rfl:  .  ipratropium-albuterol (DUONEB) 0.5-2.5 (3) MG/3ML SOLN, Take 3 mLs by nebulization every 6 (six) hours as needed., Disp: 360 mL, Rfl: 5 .  loratadine (CLARITIN) 10 MG  tablet, Take 1 tablet (10 mg total) by mouth daily., Disp: 30 tablet, Rfl: 2 .  nicotine (NICODERM CQ - DOSED IN MG/24 HOURS) 14 mg/24hr patch, Place 14 mg onto the skin daily., Disp: , Rfl:  .  olopatadine (PATANOL) 0.1 % ophthalmic solution, PLACE 1 DROP IN EACH EYE 2 TIMES A DAY IF NEEDED FOR ITCHY EYES, Disp: 5 mL, Rfl: 4 .  Olopatadine HCl (PATADAY) 0.2 % SOLN, Place 1 drop into both eyes 1 day or 1 dose., Disp: 1 Bottle, Rfl: 5 .  omeprazole (PRILOSEC) 40 MG capsule, Take 40 mg by mouth daily., Disp: , Rfl:  .  Respiratory Therapy Supplies (FLUTTER) DEVI, 1 Device by Does not apply route as directed., Disp: 1 each, Rfl: 0 .  sodium chloride HYPERTONIC 3 % nebulizer solution, Take by nebulization 2 (two) times daily., Disp: 360 mL, Rfl: 11

## 2018-04-24 ENCOUNTER — Telehealth: Payer: Self-pay | Admitting: Pulmonary Disease

## 2018-04-24 NOTE — Telephone Encounter (Signed)
Called [facility] and spoke with Dorathy DaftKayla in regards to the Rx that was sent. I clarified the instructions for the inhaler. Nothing further needed.

## 2018-04-24 NOTE — Telephone Encounter (Signed)
One puff q1hr while awake prn nicotine withdrawal symptoms (jitteriness, anxiety)

## 2018-04-24 NOTE — Telephone Encounter (Signed)
Returned call to [employer] CommunicationsLekesha, at [facility].  She is requesting a frequency for Nicotine Inhaler, for Medicaid to cover.  It can not exceed 16 inhales /day.   Dr. Kendrick FriesMcQuaid, please advise

## 2018-04-26 ENCOUNTER — Ambulatory Visit: Payer: Medicaid Other | Admitting: Pulmonary Disease

## 2018-05-13 ENCOUNTER — Other Ambulatory Visit: Payer: Self-pay | Admitting: Pulmonary Disease

## 2018-06-27 ENCOUNTER — Other Ambulatory Visit: Payer: Self-pay | Admitting: Pulmonary Disease

## 2018-07-23 ENCOUNTER — Telehealth: Payer: Self-pay

## 2018-07-23 MED ORDER — AMOXICILLIN-POT CLAVULANATE 875-125 MG PO TABS: 1.0000 | ORAL_TABLET | Freq: Two times a day (BID) | ORAL | 0 refills | Status: DC

## 2018-07-23 NOTE — Telephone Encounter (Signed)
Primary Pulmonologist: BQ Last office visit and with whom: 04/23/18 What do we see them for (pulmonary problems): COPD and asthma Last OV assessment/plan: COPD-asthma overlap syndrome with acute exacerbation: Finish the antibiotics prescribed by your primary care physician Take prednisone 20 mg daily x5 days Continue using albuterol as needed for chest tightness, wheezing, shortness of breath Continue taking Symbicort daily  It is important to stop smoking right away.  However, if you continue to have recurrent exacerbations we may need to consider putting you on azithromycin every day.  Tobacco abuse: Stop smoking We will prescribe a Nicotrol inhaler to help you quit smoking  We will see you back in 2 to 3 months or sooner if needed   Reason for call: Patient called office, her nose is running itching and burning, chest congestion, cough with thick yellow mucous. She states she typically develops bronchitis around this time of year. Denies SOB or chest pain. No recent travel. Denies being around anyone who has been sick. Increased wheezing. Symptoms started 3 days ago. Denies fever. She uses symbicort, combivent, and neb daily that does not seem to be helping with the wheezing.   [facility] [address] in [place] back [phone]  Archie Patten, please advise. Thanks!

## 2018-07-23 NOTE — Telephone Encounter (Signed)
Patient called office, her nose is running itching and burning, chest congestion, cough with thick yellow mucous. She states she typically develops bronchitis around this time of year. Denies SOB or chest pain. No recent travel. Denies being around anyone who has been sick. Increased wheezing. Symptoms started 3 days ago. Denies fever. She uses symbicort, combivent, and neb daily that does not seem to be helping with the wheezing.   [facility] [address] in [place] [place] [phone]

## 2018-07-23 NOTE — Telephone Encounter (Signed)
Spoke with patient. She is aware that Kristi Allen has called in amoxicillin for her. She also wanted to cancel her appt with BQ this Thursday in [place]. She will call back once she is feeling better and the coronavirus threat is gone.   Nothing further needed at time of call.

## 2018-07-23 NOTE — Telephone Encounter (Signed)
I have sent in an antibiotic. She can take mucinex twice daily. Please call if fever develops.

## 2018-07-24 ENCOUNTER — Other Ambulatory Visit: Payer: Self-pay | Admitting: Pulmonary Disease

## 2018-07-25 ENCOUNTER — Ambulatory Visit: Payer: Medicaid Other | Admitting: Pulmonary Disease

## 2018-09-06 ENCOUNTER — Other Ambulatory Visit: Payer: Self-pay | Admitting: Pulmonary Disease

## 2018-09-11 ENCOUNTER — Telehealth: Payer: Self-pay | Admitting: Pulmonary Disease

## 2018-09-11 MED ORDER — DEXTROMETHORPHAN HBR 15 MG/5ML PO SYRP: 10.0000 mL | ORAL_SOLUTION | Freq: Four times a day (QID) | ORAL | 0 refills | Status: DC | PRN

## 2018-09-11 MED ORDER — BENZONATATE 100 MG PO CAPS: 200.0000 mg | ORAL_CAPSULE | Freq: Three times a day (TID) | ORAL | 1 refills | Status: DC | PRN

## 2018-09-11 MED ORDER — CETIRIZINE HCL 10 MG PO TABS: ORAL_TABLET | ORAL | 5 refills | Status: DC

## 2018-09-11 NOTE — Telephone Encounter (Signed)
Sure, what pharmacy?

## 2018-09-11 NOTE — Telephone Encounter (Signed)
Rx for both Zyrtec and Delsym have been sent to patient's pharmacy .Pharm[facility] is [facility] [address] [place], [place] [phone]. Pt notified.

## 2018-09-11 NOTE — Telephone Encounter (Signed)
Called and spoke with pt in regards to recommendations per Buelah Manis and that we could send in an Rx of benzonatate 200mg  for her to take tid prn cough.  Pt expressed understanding and was wondering if an Rx of 100mg  could be called in of benzonatate for her to take 2 at a time to add up for the 200mg  due to the cost of the 100mg  vs the 200mg .  Beth, please advise on this if you are okay for [place] to call in 100mg  Rx benzonatate for pt to take 2 capsules tid prn cough. Thanks!

## 2018-09-11 NOTE — Telephone Encounter (Signed)
Primary Pulmonologist: BQ Last office visit and with whom: 04/23/18 with BQ What do we see them for (pulmonary problems): bronchiectasis/ CVID Last OV assessment/plan: Instructions  Return in about 3 months (around 07/23/2018).  COPD-asthma overlap syndrome with acute exacerbation: Finish the antibiotics prescribed by your primary care physician Take prednisone 20 mg daily x5 days Continue using albuterol as needed for chest tightness, wheezing, shortness of breath Continue taking Symbicort daily  It is important to stop smoking right away.  However, if you continue to have recurrent exacerbations we may need to consider putting you on azithromycin every day.  Tobacco abuse: Stop smoking We will prescribe a Nicotrol inhaler to help you quit smoking  We will see you back in 2 to 3 months or sooner if needed        Was appointment offered to patient (explain)?  Pt wants recommendations   Reason for call: Called and spoke with pt who has complaints of cough which began Saturday 5/2. Pt stated cough became worse after she kept a dog 5/4 and 5/5. Pt is coughing up yellow mucus, has burning, itching, and watering going on with her eyes due to keeping the dog as she states her allergies get bad this time of the year.  Due to pt's cough, pt stated she has not been able to get any sleep. Pt did take a benzonatate which helped some but not much.  Asked pt if she has tried any OTC cough meds (Robitussin, Delsym, mucinex) and pt stated that she has not tried any OTC cough meds. Due to the itching, pt stated she took a benadryl Monday 5/4 but has not taken any since then. Pt does have some occ postnasal drainage which is yellow in color.  Pt denies any complaints of fever, SOB, body aches or chills. Pt does have some pain and pressure in her cheek bones, has some discomfort on the right side of her chest and also has pain on the right side of her neck which she believes is due to coughing.  Pt  wants to know recommendations to help with her symptoms. Beth, please advise on this for pt. Thanks!

## 2018-09-11 NOTE — Telephone Encounter (Signed)
Rx of 100mg  benzonatate has been called in to pharmacy for pt for her to take 2 daily tid prn cough. Nothing further needed.

## 2018-09-11 NOTE — Telephone Encounter (Signed)
sure

## 2018-09-11 NOTE — Telephone Encounter (Signed)
2nd message: benzonatate was sent in earlier.  Pt states she has Medicaid and she would need Zyrtec and Delysm sent in as rx.   Primary Pulmonologist: BQ Last office visit and with whom: 04/23/18 with BQ What do we see them for (pulmonary problems): bronchiectasis/ CVID Last OV assessment/plan: Instructions  Return in about 3 months (around 07/23/2018).  COPD-asthma overlap syndrome with acute exacerbation: Finish the antibiotics prescribed by your primary care physician Take prednisone 20 mg daily x5 days Continue using albuterol as needed for chest tightness, wheezing, shortness of breath Continue taking Symbicort daily  It is important to stop smoking right away. However, if you continue to have recurrent exacerbations we may need to consider putting you on azithromycin every day.  Tobacco abuse: Stop smoking We will prescribe a Nicotrol inhaler to help you quit smoking  We will see you back in 2 to 3 months or sooner if needed        Was appointment offered to patient (explain)?  Pt wants recommendations   Reason for call: Called and spoke with pt who has complaints of cough which began Saturday 5/2. Pt stated cough became worse after she kept a dog 5/4 and 5/5. Pt is coughing up yellow mucus, has burning, itching, and watering going on with her eyes due to keeping the dog as she states her allergies get bad this time of the year.  Due to pt's cough, pt stated she has not been able to get any sleep. Pt did take a benzonatate which helped some but not much.  Asked pt if she has tried any OTC cough meds (Robitussin, Delsym, mucinex) and pt stated that she has not tried any OTC cough meds. Due to the itching, pt stated she took a benadryl Monday 5/4 but has not taken any since then. Pt does have some occ postnasal drainage which is yellow in color.  Pt denies any complaints of fever, SOB, body aches or chills. Pt does have some pain and pressure in her cheek bones, has  some discomfort on the right side of her chest and also has pain on the right side of her neck which she believes is due to coughing.  Pt wants to know recommendations to help with her symptoms. Beth, please advise on this for pt. Thanks!

## 2018-09-11 NOTE — Telephone Encounter (Signed)
Advise over the counter antihistamine (such as zyrtec or Claritin) and flonase nasal spray daily. We can send in tesslon perles for cough if she would like. Take 200mg  tab TID prn cough. Thanks

## 2018-10-07 ENCOUNTER — Telehealth: Payer: Self-pay | Admitting: Pulmonary Disease

## 2018-10-07 ENCOUNTER — Other Ambulatory Visit: Payer: Self-pay

## 2018-10-07 ENCOUNTER — Encounter: Payer: Self-pay | Admitting: Nurse Practitioner

## 2018-10-07 ENCOUNTER — Ambulatory Visit (INDEPENDENT_AMBULATORY_CARE_PROVIDER_SITE_OTHER): Payer: Medicaid Other | Admitting: Nurse Practitioner

## 2018-10-07 DIAGNOSIS — J019 Acute sinusitis, unspecified: Secondary | ICD-10-CM | POA: Diagnosis not present

## 2018-10-07 DIAGNOSIS — J0100 Acute maxillary sinusitis, unspecified: Secondary | ICD-10-CM | POA: Insufficient documentation

## 2018-10-07 MED ORDER — AMOXICILLIN-POT CLAVULANATE 875-125 MG PO TABS: 1.0000 | ORAL_TABLET | Freq: Two times a day (BID) | ORAL | 0 refills | Status: DC

## 2018-10-07 MED ORDER — FLUCONAZOLE 150 MG PO TABS: 150.0000 mg | ORAL_TABLET | Freq: Every day | ORAL | 1 refills | Status: DC

## 2018-10-07 NOTE — Telephone Encounter (Signed)
Called & spoke w/ pt regarding SG's recommendations. Pt verbalize understanding and agreed to setting up a telephone visit at 1:30 PM w/ TN. Appointment has been scheduled. Routing to TN as an [place]. Nothing further needed at this time.

## 2018-10-07 NOTE — Assessment & Plan Note (Signed)
Patient states that over the past week she has been having sinus pressure and pain.  She complains also of sore throat and right ear pain.  She states that she has yellow nasal drainage.  She states that symptoms are progressively worsening.  Unfortunately, patient continues to smoke.  She has cut back to 1 to 2 packs/day.  She was previously smoking 3 to 4 packs/day.  She is followed by allergy/immunology and is currently on hizentra injections.  Patient Instructions  Sinusitis: Will order augmentin Hydrate  Drink enough water to keep your pee (urine) clear or pale yellow. Rest  Rest as much as possible.  Sleep with your head raised (elevated).  Make sure to get enough sleep each night. Other instructions  Put a warm, moist washcloth on your face 3-4 times a day or as told by your doctor. This will help with discomfort.  Wash your hands often with soap and water. If there is no soap and water, use hand sanitizer.  Do not smoke. Avoid being around people who are smoking (secondhand smoke). Call the office if:  You have a fever.  Your symptoms get worse.  Your symptoms do not get better within 10 days.  Common variable immunodeficiency with bronchiectasis: Keep follow-up with the immunologist Continue flutter valve 4 to 5 breaths, 4-5 times a day Continue hypertonic saline nebulized twice a day Use the DuoNeb 3 times a day  COPD-asthma overlap syndrome: Stop smoking Use the Symbicort 2 puffs twice a day no matter how you feel  Cigarette smoking: Stop smoking  Pulmonary nodule: We will repeat a CT scan of your chest in August   Follow up after CT scan in late August with Dr. Kendrick Fries or sooner if needed

## 2018-10-07 NOTE — Patient Instructions (Signed)
Sinusitis: Will order augmentin Hydrate  Drink enough water to keep your pee (urine) clear or pale yellow. Rest  Rest as much as possible.  Sleep with your head raised (elevated).  Make sure to get enough sleep each night. Other instructions  Put a warm, moist washcloth on your face 3-4 times a day or as told by your doctor. This will help with discomfort.  Wash your hands often with soap and water. If there is no soap and water, use hand sanitizer.  Do not smoke. Avoid being around people who are smoking (secondhand smoke). Call the office if:  You have a fever.  Your symptoms get worse.  Your symptoms do not get better within 10 days.  Common variable immunodeficiency with bronchiectasis: Keep follow-up with the immunologist Continue flutter valve 4 to 5 breaths, 4-5 times a day Continue hypertonic saline nebulized twice a day Use the DuoNeb 3 times a day  COPD-asthma overlap syndrome: Stop smoking Use the Symbicort 2 puffs twice a day no matter how you feel  Cigarette smoking: Stop smoking  Pulmonary nodule: We will repeat a CT scan of your chest in August   Follow up after CT scan in late August with Dr. Kendrick Fries or sooner if needed

## 2018-10-07 NOTE — Telephone Encounter (Signed)
Set up with Archie Patten or Beth for televisit/ video visit today

## 2018-10-07 NOTE — Telephone Encounter (Signed)
Primary Pulmonologist: BQ Last office visit and with whom: 04/2018 with BQ What do we see them for (pulmonary problems): bronchiectasis, cvid Last OV assessment/plan: COPD-asthma overlap syndrome with acute exacerbation: Finish the antibiotics prescribed by your primary care physician Take prednisone 20 mg daily x5 days Continue using albuterol as needed for chest tightness, wheezing, shortness of breath Continue taking Symbicort daily  It is important to stop smoking right away.  However, if you continue to have recurrent exacerbations we may need to consider putting you on azithromycin every day.  Tobacco abuse: Stop smoking We will prescribe a Nicotrol inhaler to help you quit smoking  We will see you back in 2 to 3 months or sooner if needed   Was appointment offered to patient (explain)?  Patient only wants to see BQ. Explained to patient that he is working at the hospital due to COVID. Patient just wants recommendations until she can see BQ.    Reason for call: Spoke with patient. She stated that she believes she has developed a sinus infection. She has a headache right above her eyes. She has a productive cough with yellow phlegm as well as a runny nose with yellow mucus.   She has been using her Flonase and Tessalon perles. They have been providing her with some relief but the symptoms will come back after a few hours.   She is requesting an antibiotic.   Pharmacy is [facility] in [place].   SG, please advise. Thanks!

## 2018-10-07 NOTE — Progress Notes (Signed)
Virtual Visit via Telephone Note  I connected with Pheobe Delucchi on 10/07/18 at  1:30 PM EDT by telephone and verified that I am speaking with the correct person using two identifiers.  Location: Patient: home Provider: office   I discussed the limitations, risks, security and privacy concerns of performing an evaluation and management service by telephone and the availability of in person appointments. I also discussed with the patient that there may be a patient responsible charge related to this service. The patient expressed understanding and agreed to proceed.   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  She continues to smoke cigarettes.  She started smoking at age 14 and at one point she was smoking 4 packs per day, currently smoking 1.5-2 packs per day.  She took Chantix once that gave her severe mood swings so she stopped it.   She is on Hizentra injections per allergy/immonolgy specialist.   63 year old female active smoker with COPD/asthma overlap and bronchiectasis in the setting of CVI D is followed by Dr. Kendrick Fries.  Patient has a till visit today for an acute visit.  She states that over the past week she has been having sinus pressure and pain.  She complains also of sore throat and right ear pain.  She states that she has yellow nasal drainage.  She states that symptoms are progressively worsening.  Unfortunately, patient continues to smoke.  She has cut back to 1 to 2 packs/day.  She was previously smoking 3 to 4 packs/day.  She is followed by allergy/immunology and is currently on hizentra injections. Denies f/c/s, n/v/d, hemoptysis, PND, leg swelling.     Observations/Objective: Chest imaging: December 12, 2017 high-resolution CT scan of the chest images independently reviewed showing normal pulmonary parenchyma,  scattered pulmonary nodules, 2 to 3 mm in size, perhaps very mild airway enlargement in the bases suggestive of bronchiectasis  PFT: July 2018 [facility]: Normal appearing flow volume loop, FEV1 1.61 L 69% predicted 08/2017 spiro no airflow obstruction  Labs: 08/2017 CBC 100 absolute eos, IgE 215  Path: 08/2017 Sputum cytology negative   Micro: 08/2017 Sputum culture neg AFB; fungus: candida sp and penicillium   Assessment and Plan: Patient states that over the past week she has been having sinus pressure and pain.  She complains also of sore throat and right ear pain.  She states that she has yellow nasal drainage.  She states that symptoms are progressively worsening.  Unfortunately, patient continues to smoke.  She has cut back to 1 to 2 packs/day.  She was previously smoking 3 to 4 packs/day.  She is followed by allergy/immunology and is currently on hizentra injections.  Patient Instructions  Sinusitis: Will order augmentin Hydrate  Drink enough water to keep your pee (urine) clear or pale yellow. Rest  Rest as much as possible.  Sleep with your head raised (elevated).  Make sure to get enough sleep each night. Other instructions  Put a warm, moist washcloth on your face 3-4 times a day or as told by your doctor. This will help with discomfort.  Wash your hands often with soap and water. If there is no soap and water, use hand sanitizer.  Do not smoke. Avoid being around people who are smoking (secondhand smoke). Call the office if:  You have a fever.  Your symptoms get worse.  Your  symptoms do not get better within 10 days.  Common variable immunodeficiency with bronchiectasis: Keep follow-up with the immunologist Continue flutter valve 4 to 5 breaths, 4-5 times a day Continue hypertonic saline nebulized twice a day Use the DuoNeb 3 times a day  COPD-asthma overlap syndrome: Stop smoking Use the Symbicort 2 puffs twice  a day no matter how you feel  Cigarette smoking: Stop smoking  Pulmonary nodule: We will repeat a CT scan of your chest in August     Follow Up Instructions: Follow up after CT scan in late August with Dr. Kendrick FriesMcQuaid or sooner if needed   I discussed the assessment and treatment plan with the patient. The patient was provided an opportunity to ask questions and all were answered. The patient agreed with the plan and demonstrated an understanding of the instructions.   The patient was advised to call back or seek an in-person evaluation if the symptoms worsen or if the condition fails to improve as anticipated.  I provided 22 minutes of non-face-to-face time during this encounter.   Ivonne Andrewonya S Gabriela Irigoyen, NP

## 2018-10-08 NOTE — Progress Notes (Signed)
Reviewed, agree 

## 2018-11-28 ENCOUNTER — Other Ambulatory Visit: Payer: Self-pay | Admitting: Primary Care

## 2018-11-29 NOTE — Telephone Encounter (Signed)
Tonya, please advise if you are okay with [place] refilling pt's benzonatate Rx. Thanks!

## 2018-12-13 ENCOUNTER — Ambulatory Visit (HOSPITAL_BASED_OUTPATIENT_CLINIC_OR_DEPARTMENT_OTHER): Payer: Medicaid Other

## 2019-01-24 ENCOUNTER — Other Ambulatory Visit: Payer: Self-pay | Admitting: Pulmonary Disease

## 2019-04-14 IMAGING — CT CT CHEST HIGH RESOLUTION W/O CM
2 of 5 series · 15 of 36 positions shown, 18 images · non-contrast
Comparison: No priors.

CLINICAL DATA: 62-year-old female with history of chronic cough,
shortness of breath and wheezing. Smoker.

EXAM:
CT CHEST WITHOUT CONTRAST
TECHNIQUE: Multidetector CT imaging of the chest was performed following the
standard protocol without intravenous contrast. High resolution
imaging of the lungs, as well as inspiratory and expiratory imaging,
was performed.

[Series 2: thorax · axial · 0.63mm/px · z∈[-262,+4]mm · 12 of 149 slices shown, 15 images]
[im 8/149  mediastinal]
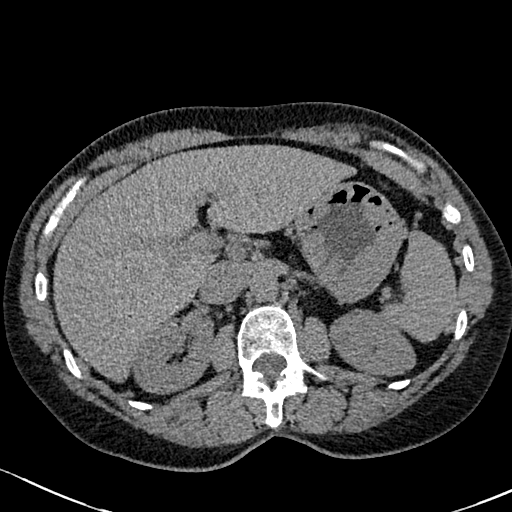
[im 8/149  lung]
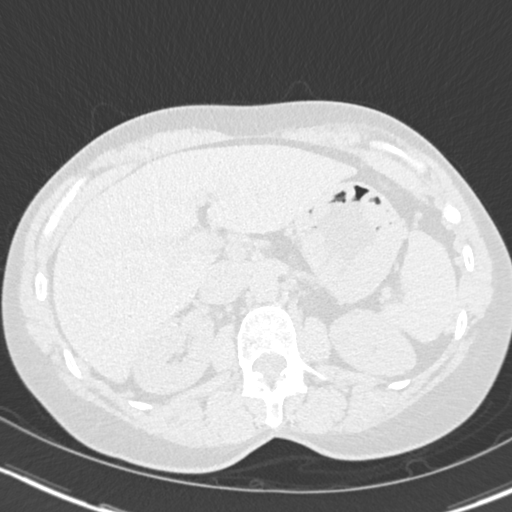
[im 22/149  lung]
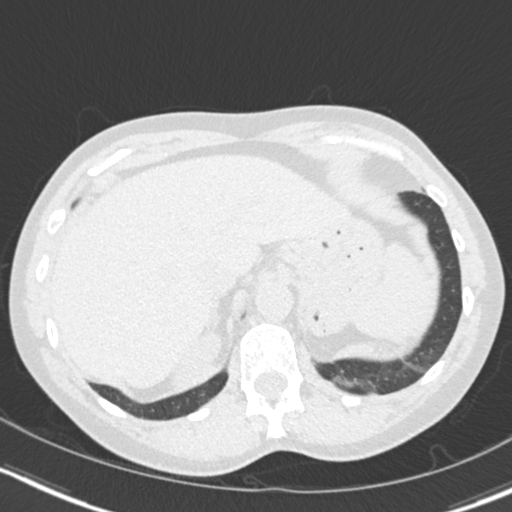
[im 36/149  lung]
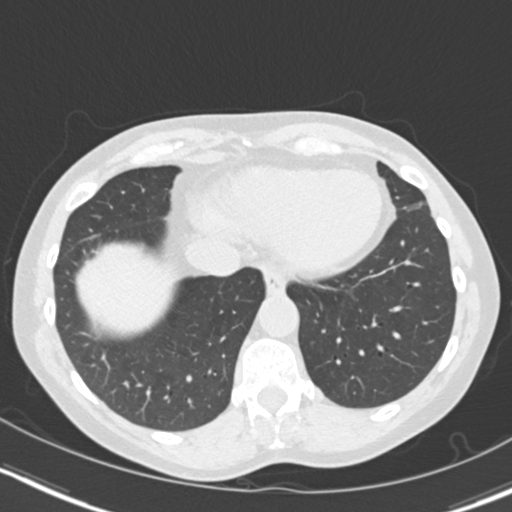
[im 43/149  lung]
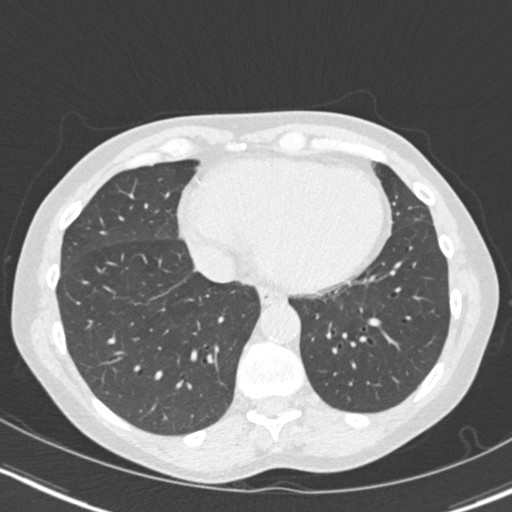
[im 57/149  mediastinal]
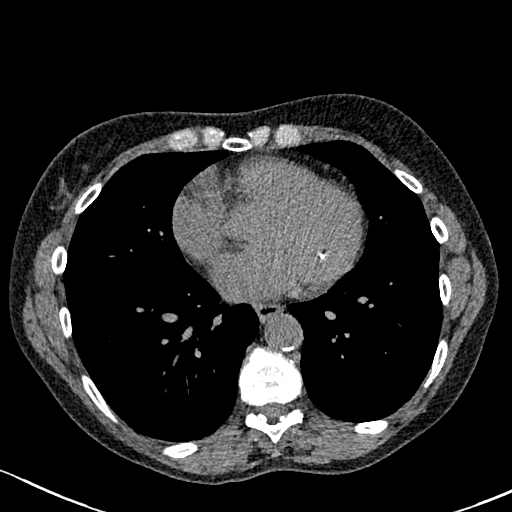
[im 57/149  lung]
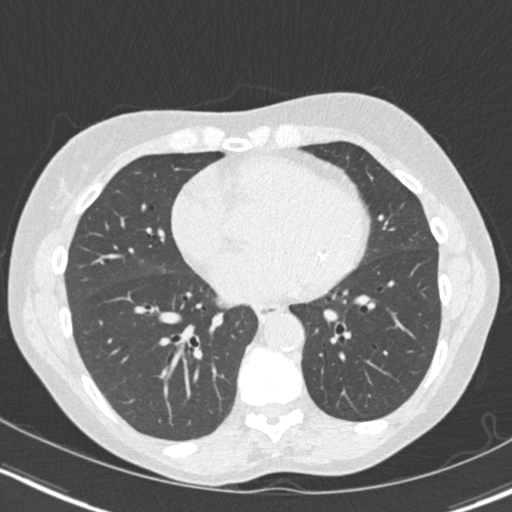
[im 71/149  lung]
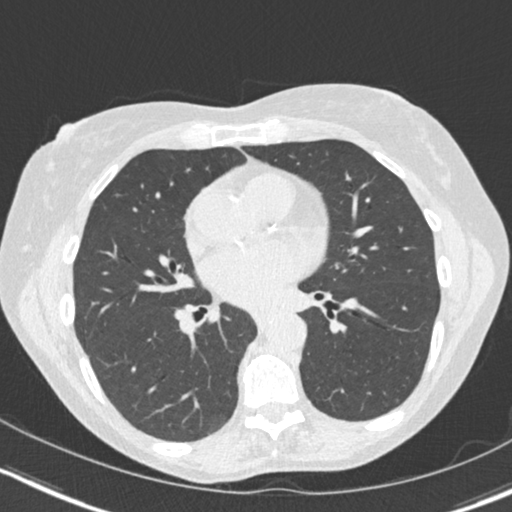
[im 78/149  lung]
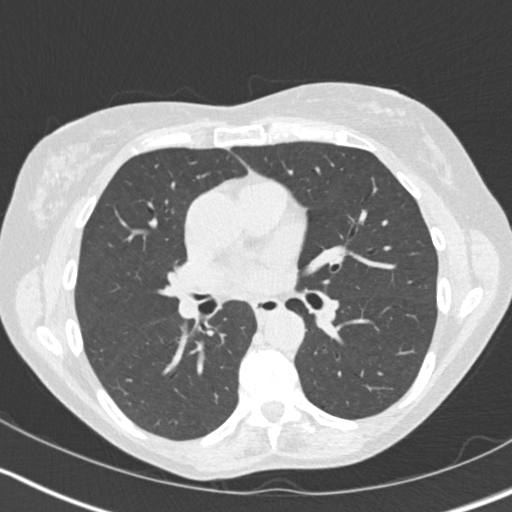
[im 92/149  lung]
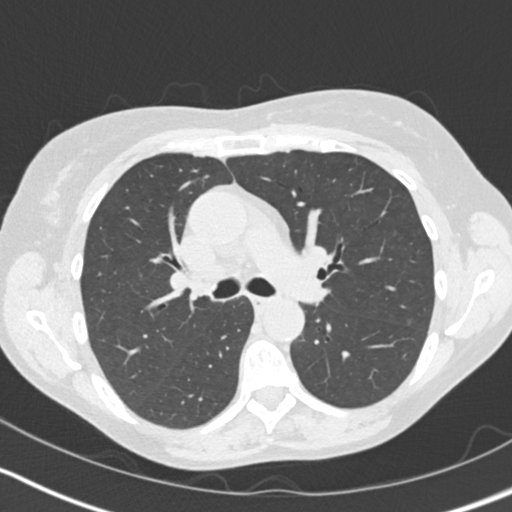
[im 106/149  mediastinal]
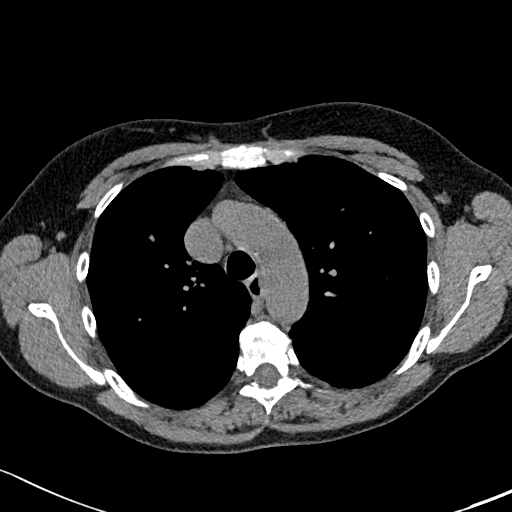
[im 106/149  lung]
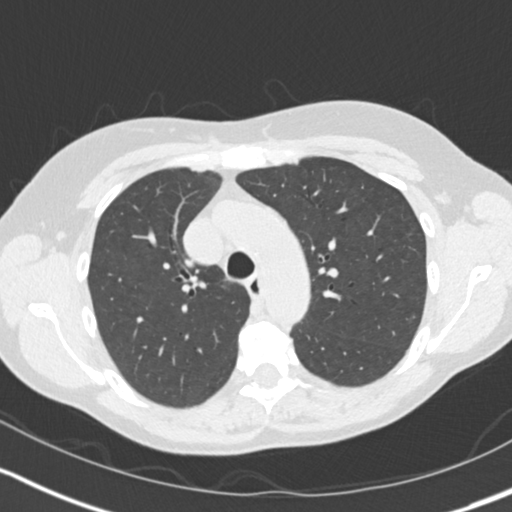
[im 113/149  lung]
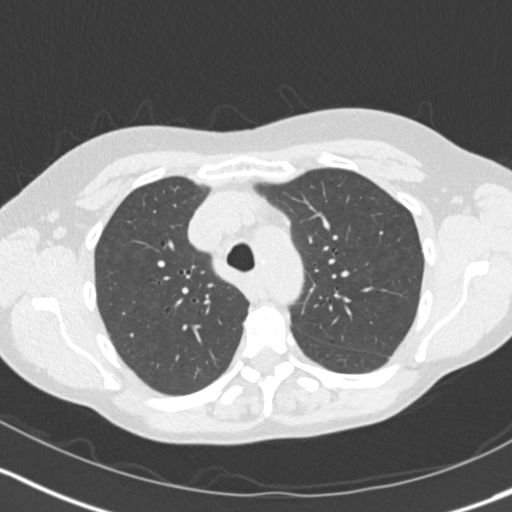
[im 127/149  lung]
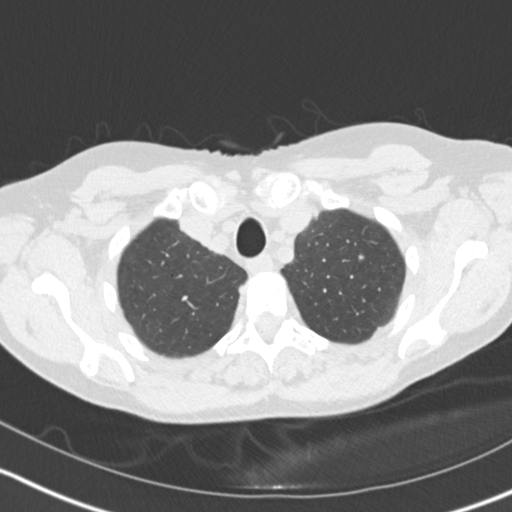
[im 141/149  lung]
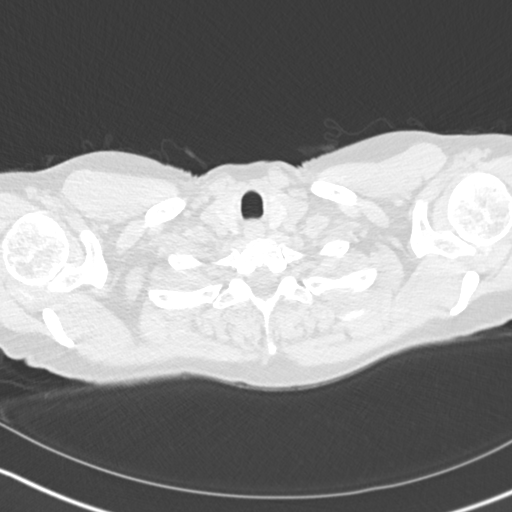

[Series 8: coronal · coronal · 0.62mm/px · 3 of 115 slices shown]
[im 23/115  lung]
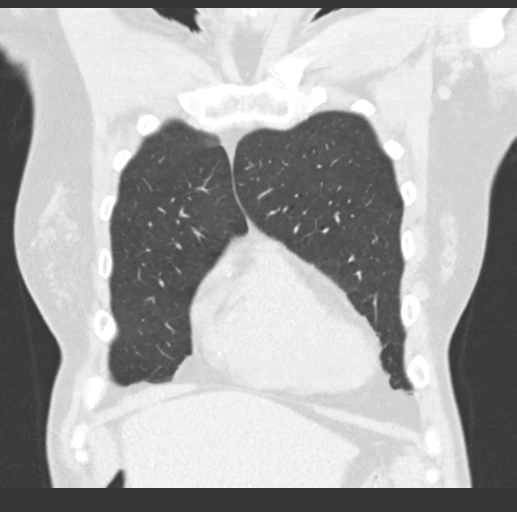
[im 46/115  lung]
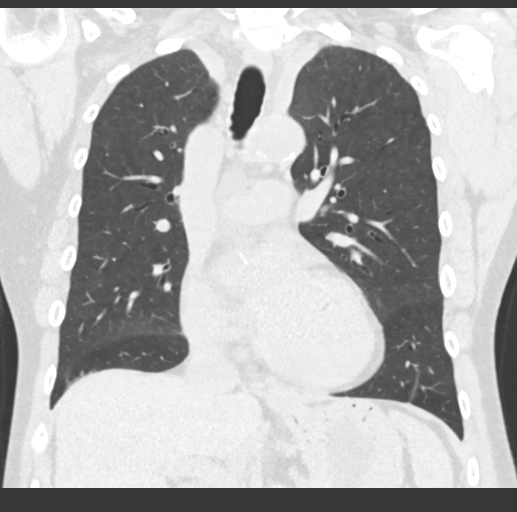
[im 69/115  lung]
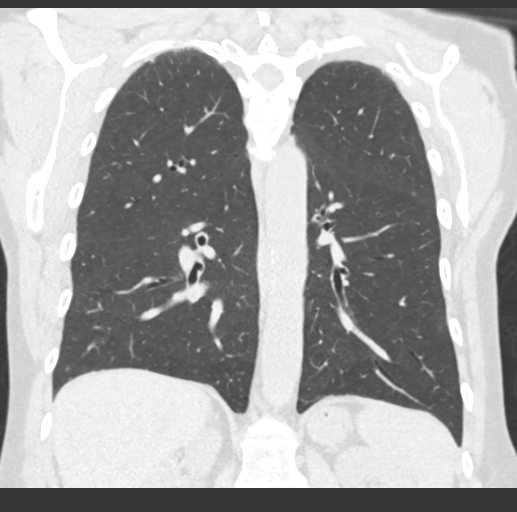

[15 of 36 positions shown; findings below may reference images not displayed]

FINDINGS: Cardiovascular: Heart size is normal. There is no significant
pericardial fluid, thickening or pericardial calcification. There is
aortic atherosclerosis, as well as atherosclerosis of the great
vessels of the mediastinum and the coronary arteries, including
calcified atherosclerotic plaque in the left main, left anterior
descending and right coronary arteries. Calcifications of the mitral
annulus and subvalvular apparatus.

Mediastinum/Nodes: No pathologically enlarged mediastinal or hilar
lymph nodes. Please note that accurate exclusion of hilar adenopathy
is limited on noncontrast CT scans. Esophagus is unremarkable in
appearance. No axillary lymphadenopathy.

Lungs/Pleura: A few scattered tiny 2-3 mm pulmonary nodules are
noted throughout the lungs bilaterally. No other larger more
suspicious appearing pulmonary nodules or masses are noted. No acute
consolidative airspace disease. No pleural effusions.
High-resolution images demonstrate no significant regions of
ground-glass attenuation, subpleural reticulation, parenchymal
banding, traction bronchiectasis or frank honeycombing. Inspiratory
and expiratory imaging demonstrates minimal air trapping indicative
of very mild small airways disease.

Upper Abdomen: Aortic atherosclerosis. Calcified granuloma in the
liver.

Musculoskeletal: There are no aggressive appearing lytic or blastic
lesions noted in the visualized portions of the skeleton.
IMPRESSION: 1. No findings to suggest interstitial lung disease.
2. Very mild air trapping indicative of mild small airways disease.
3. Multiple tiny 2-3 mm pulmonary nodules. These are nonspecific and
statistically likely benign. No follow-up needed if patient is
low-risk (and has no known or suspected primary neoplasm).
Non-contrast chest CT can be considered in 12 months if patient is
high-risk. This recommendation follows the consensus statement:
Guidelines for Management of Incidental Pulmonary Nodules Detected
[DATE].
4. Aortic atherosclerosis, in addition to left main and 2 vessel
coronary artery disease. Please note that although the presence of
coronary artery calcium documents the presence of coronary artery
disease, the severity of this disease and any potential stenosis
cannot be assessed on this non-gated CT examination. Assessment for
potential risk factor modification, dietary therapy or pharmacologic
therapy may be warranted, if clinically indicated.
5. There are calcifications of the mitral annulus and subvalvular
apparatus. Echocardiographic correlation for evaluation of potential
valvular dysfunction may be warranted if clinically indicated.

Aortic Atherosclerosis ([id number]-07E.E).

## 2019-09-25 NOTE — Progress Notes (Signed)
Follow Up Note  RE: Kristi Allen MRN: [id number] DOB: August 11, 1955 Date of Office Visit: 09/26/2019  Referring provider: List, Nash Shearer, FNP Primary care provider: List, Nash Shearer, FNP  Chief Complaint: Asthma  History of Present Illness: I had the pleasure of seeing Kristi Allen for a follow up visit at the [facility] on 09/26/2019. She is a 64 y.o. female, who is being followed for CVID, asthma and rhinitis. Her previous allergy office visit was on 11/27/2017 with Dr. Beaulah Dinning. Today is a regular follow up visit. Up to date with COVID-19 vaccine: no   CVID: Currently on Hizentra weekly injections but has missed a few doses here and there as sometimes she can't focus to inject herself when she has a ? Asthma flare which causes her to cough uncontrollably.  No recent antibiotics and has not had any repeat bloodwork in over 1 year.   Patient has not been following with pulmonology either and did not get repeat CT chest.  Patient has been taking prednisone taper a few times per year with some benefit.  Rhinitis: Currently on Flonase 2 sprays in the mornings, saline spray, Claritin daily and Singulair daily with some benefit.  Breathing: Currently having issues with coughing, wheezing, shortness of breath. Using Symbicort 160 2 puffs every 3-4 hours and albuterol nebulizer daily.  Currently smoking 1.5 pack per day which is less than 3 packs per day and trying to quit.   Vaccine: Patient is hesitant about getting covid-19 vaccine.  Any known reactions to polyethylene glycol or polysorbate? No.  Any history of anaphylaxis to vaccinations? No Any history of reactions to injectable medications? No Any history of anaphylaxis to colonoscopy preps (i.e.Miralax)? No Any history of dermal filler treatments in the last year? No  Assessment and Plan: Yicel is a 64 y.o. female with: CVID (common variable immunodeficiency) (HCC) Patient was diagnosed with CVID prior to  coming to our office and has been on weekly Hizentra injections with good benefit. No recent antibiotics use and no recent bloodwork. She mentions skipping few doses at times due to inability to self-administer due to her coughing fits. Patient had bronchiectasis on CT chest in past. Still smoking 1.5 pack per day.  Get bloodwork as below to check immunoglobulin levels.  Continue with weekly Hizentra injections for now.  Will contact our infusion coordinator to see if she qualifies for another subcutaneous immunoglobulin replacement therapy that can be infused less frequently possibly (Hyqvia or Cuvitru).  Stressed importance of not missing infusions and if she is unable to infuse at home to bring to the office so we can help with her infusions.   Keep track of infections - contact our office for future infections.   Discussed the pros and cons of her receiving the COVID-19 vaccine. At this time, there is limited data on CVID patients and COVID-19 vaccinations but the consensus has been that CVID patients should receive them as it may offer them some protection which is better than none.  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 Had bronchiectasis on CT chest and failed to follow up. Still smoking 1.5 pack per day which is less than her previous of 3 packs per day. Using Symbicort 160 2 puffs every few hours and albuterol on a daily basis due to coughing, wheezing and shortness of breath.  Patient's symptoms are most likely stemming from a combination of asthma, COPD, bronchiectasis, and continued tobacco exposure.   Discussed at length  the importance of smoking cessation especially with having CVID with bronchiectasis.   Today's spirometry showed: restriction with 11% improvement in FEV1 post bronchodilator treatment  ACT score 6.  . Get CXR now and will need repeat CT chest.  o Recommend that she follows up with pulmonology as well.  . For the  next 7 days, use albuterol nebulizer twice a day before using the Breztri. . Daily controller medication(s): start Breztri 2 puffs twice a day with spacer and rinse mouth afterwards. Sample given. Demonstrated proper use.  o Stop Symbicort for now.  . Prior to physical activity: May use albuterol rescue inhaler 2 puffs 5 to 15 minutes prior to strenuous physical activities. Marland Kitchen Rescue medications: May use albuterol rescue inhaler 2 puffs or nebulizer every 4 to 6 hours as needed for shortness of breath, chest tightness, coughing, and wheezing. Monitor frequency of use.  . Repeat spirometry at next visit.   Chronic rhinitis Complaining of continual rhinitis symptoms. No recent allergy testing.  May use over the counter antihistamines such as Xyzal (levocetirizine) daily.  Stop Claritin as ineffective.   Continue with Singulair (montelukast) 10mg  daily at night.  Use Fluticasone 1 spray per nostril twice a day for nasal congestion.  Use azelastine 1-2 sprays per nostril twice a day for drainage.  Nasal saline spray (i.e., Simply Saline) or nasal saline lavage (i.e., NeilMed) is recommended as needed and prior to medicated nasal sprays.  Gave instruction for saline lavage.  Will get bloodwork to check environmental allergies as well.   Return in about 4 weeks (around 10/24/2019).  Meds ordered this encounter  Medications  . Azelastine HCl 0.15 % SOLN    Sig: Place 1-2 sprays into the nose in the morning and at bedtime. For drainage    Dispense:  30 mL    Refill:  2  . fluticasone (FLONASE) 50 MCG/ACT nasal spray    Sig: Place 1 spray into both nostrils in the morning and at bedtime. For drainage    Dispense:  16 g    Refill:  2  . Budeson-Glycopyrrol-Formoterol (BREZTRI AEROSPHERE) 160-9-4.8 MCG/ACT AERO    Sig: Inhale 2 sprays into the lungs in the morning and at bedtime. with spacer and rinse mouth afterwards.    Dispense:  10.7 g    Refill:  2  . albuterol (VENTOLIN HFA) 108  (90 Base) MCG/ACT inhaler    Sig: Inhale 2-4 puffs into the lungs every 4 (four) hours as needed for wheezing or shortness of breath (coughing fits).    Dispense:  18 g    Refill:  2  . montelukast (SINGULAIR) 10 MG tablet    Sig: Take 1 tablet (10 mg total) by mouth at bedtime.    Dispense:  30 tablet    Refill:  2    Lab Orders     CBC with Differential/Platelet     IgG, IgA, IgM     Sedimentation rate     Comprehensive metabolic panel     Allergens w/Total IgE Area 2  Diagnostics: Spirometry:  Tracings reviewed. Her effort: It was hard to get consistent efforts and there is a question as to whether this reflects a maximal maneuver. FVC: 1.70L FEV1: 1.48L, 71% predicted FEV1/FVC ratio: 87% Interpretation: Spirometry consistent with possible restrictive disease with 11% improvement in FEV1 post bronchodilator treatment.  Please see scanned spirometry results for details.  Medication List:  Current Outpatient Medications  Medication Sig Dispense Refill  . albuterol (PROVENTIL) (2.5 MG/3ML) 0.083%  nebulizer solution Take 2.5 mg by nebulization every 6 (six) hours as needed for wheezing or shortness of breath.    Marland Kitchen albuterol (VENTOLIN HFA) 108 (90 Base) MCG/ACT inhaler Inhale 2 puffs into the lungs every 6 (six) hours as needed for wheezing or shortness of breath.    Marland Kitchen amLODipine (NORVASC) 10 MG tablet Take 10 mg by mouth daily.    Marland Kitchen atorvastatin (LIPITOR) 10 MG tablet Take 10 mg by mouth daily.    . budesonide-formoterol (SYMBICORT) 160-4.5 MCG/ACT inhaler Inhale 2 puffs into the lungs 2 (two) times daily.    . hydrochlorothiazide (HYDRODIURIL) 25 MG tablet Take 25 mg by mouth daily.    . Immune Globulin, Human, (HIZENTRA [place]) Inject 7 g into the skin once a week. Hizentra 20%  7 grams (35 ml) one time every week.    Marland Kitchen ipratropium-albuterol (DUONEB) 0.5-2.5 (3) MG/3ML SOLN Take 3 mLs by nebulization every 6 (six) hours as needed. 360 mL 5  . loratadine (CLARITIN) 10 MG tablet Take  1 tablet (10 mg total) by mouth daily. 30 tablet 2  . losartan (COZAAR) 50 MG tablet Take 50 mg by mouth daily.    . montelukast (SINGULAIR) 10 MG tablet Take 1 tablet (10 mg total) by mouth at bedtime. 30 tablet 2  . omeprazole (PRILOSEC) 40 MG capsule Take 40 mg by mouth daily.    . predniSONE (DELTASONE) 20 MG tablet Take 1 tablet (20 mg total) by mouth daily with breakfast. 5 tablet 0  . Respiratory Therapy Supplies (FLUTTER) DEVI 1 Device by Does not apply route as directed. 1 each 0  . albuterol (VENTOLIN HFA) 108 (90 Base) MCG/ACT inhaler Inhale 2-4 puffs into the lungs every 4 (four) hours as needed for wheezing or shortness of breath (coughing fits). 18 g 2  . Azelastine HCl 0.15 % SOLN Place 1-2 sprays into the nose in the morning and at bedtime. For drainage 30 mL 2  . Budeson-Glycopyrrol-Formoterol (BREZTRI AEROSPHERE) 160-9-4.8 MCG/ACT AERO Inhale 2 sprays into the lungs in the morning and at bedtime. with spacer and rinse mouth afterwards. 10.7 g 2  . fluticasone (FLONASE) 50 MCG/ACT nasal spray Place 1 spray into both nostrils in the morning and at bedtime. For drainage 16 g 2   No current facility-administered medications for this visit.   Allergies: Allergies  Allergen Reactions  . Cyclobenzaprine   . Hydrocortisone     Breaks out in hives with topical cream.   . Sulfamethoxazole-Trimethoprim Swelling  . Codeine Rash    Other reaction(s): RASH   I reviewed her past medical history, social history, family history, and environmental history and no significant changes have been reported from her previous visit.  Review of Systems  Constitutional: Negative for appetite change, chills, fever and unexpected weight change.  HENT: Positive for congestion and rhinorrhea.   Eyes: Negative for itching.  Respiratory: Positive for cough, chest tightness, shortness of breath and wheezing.   Gastrointestinal: Negative for abdominal pain.  Skin: Negative for rash.  Neurological:  Negative for headaches.   Objective: BP 140/70   Pulse 98   Temp 98.1 F (36.7 C) (Oral)   Resp 20   Ht 5' (1.524 m)   Wt 138 lb 9.6 oz (62.9 kg)   SpO2 97%   BMI 27.07 kg/m  Body mass index is 27.07 kg/m. Physical Exam  Constitutional: She is oriented to person, place, and time. She appears well-developed and well-nourished.  HENT:  Head: Normocephalic and atraumatic.  Right Ear:  External ear normal.  Left Ear: External ear normal.  Nose: Mucosal edema and rhinorrhea present.  Mouth/Throat: Oropharynx is clear and moist.  Eyes: Conjunctivae and EOM are normal.  Cardiovascular: Normal rate, regular rhythm and normal heart sounds. Exam reveals no gallop and no friction rub.  No murmur heard. Pulmonary/Chest: Effort normal. She has no wheezes. She has rales.  Musculoskeletal:     Cervical back: Neck supple.  Neurological: She is alert and oriented to person, place, and time.  Skin: Skin is warm. No rash noted.  Psychiatric: She has a normal mood and affect. Her behavior is normal.  Nursing note and vitals reviewed.  Previous notes and tests were reviewed. The plan was reviewed with the patient/family, and all questions/concerned were addressed.  It was my pleasure to see Kristi Allen today and participate in her care. Please feel free to contact me with any questions or concerns.  Sincerely,  Wyline Mood, DO Allergy & Immunology  [facility] office: [phone] [facility] office: [phone] [facility] office: [phone]  40 minutes spent face-to-face with more than 50% of the time spent discussing CVID, vaccinations, infections, asthma/copd, smoking cessation, rhinitis.

## 2019-09-26 ENCOUNTER — Encounter: Payer: Self-pay | Admitting: Allergy

## 2019-09-26 ENCOUNTER — Ambulatory Visit (INDEPENDENT_AMBULATORY_CARE_PROVIDER_SITE_OTHER): Payer: Medicaid Other | Admitting: Allergy

## 2019-09-26 ENCOUNTER — Other Ambulatory Visit: Payer: Self-pay

## 2019-09-26 VITALS — BP 140/70 | HR 98 | Temp 98.1°F | Resp 20 | Ht 60.0 in | Wt 138.6 lb

## 2019-09-26 DIAGNOSIS — Z720 Tobacco use: Secondary | ICD-10-CM | POA: Diagnosis not present

## 2019-09-26 DIAGNOSIS — D839 Common variable immunodeficiency, unspecified: Secondary | ICD-10-CM

## 2019-09-26 DIAGNOSIS — J449 Chronic obstructive pulmonary disease, unspecified: Secondary | ICD-10-CM

## 2019-09-26 DIAGNOSIS — J310 Chronic rhinitis: Secondary | ICD-10-CM

## 2019-09-26 MED ORDER — AZELASTINE HCL 0.15 % NA SOLN: 1.0000 | Freq: Two times a day (BID) | NASAL | 2 refills | Status: DC

## 2019-09-26 MED ORDER — FLUTICASONE PROPIONATE 50 MCG/ACT NA SUSP
1.0000 | Freq: Two times a day (BID) | NASAL | 2 refills | Status: DC
Start: 2019-09-26 — End: 2022-03-07

## 2019-09-26 MED ORDER — BREZTRI AEROSPHERE 160-9-4.8 MCG/ACT IN AERO: 2.0000 | INHALATION_SPRAY | Freq: Two times a day (BID) | RESPIRATORY_TRACT | 2 refills | Status: DC

## 2019-09-26 MED ORDER — ALBUTEROL SULFATE HFA 108 (90 BASE) MCG/ACT IN AERS: 2.0000 | INHALATION_SPRAY | RESPIRATORY_TRACT | 2 refills | Status: DC | PRN

## 2019-09-26 MED ORDER — MONTELUKAST SODIUM 10 MG PO TABS: 10.0000 mg | ORAL_TABLET | Freq: Every day | ORAL | 2 refills | Status: DC

## 2019-09-26 NOTE — Assessment & Plan Note (Signed)
Patient was diagnosed with CVID prior to coming to our office and has been on weekly Hizentra injections with good benefit. No recent antibiotics use and no recent bloodwork. She mentions skipping few doses at times due to inability to self-administer due to her coughing fits. Patient had bronchiectasis on CT chest in past. Still smoking 1.5 pack per day.  Get bloodwork as below to check immunoglobulin levels.  Continue with weekly Hizentra injections for now.  Will contact our infusion coordinator to see if she qualifies for another subcutaneous immunoglobulin replacement therapy that can be infused less frequently possibly (Hyqvia or Cuvitru).  Stressed importance of not missing infusions and if she is unable to infuse at home to bring to the office so we can help with her infusions.   Keep track of infections - contact our office for future infections.   Discussed the pros and cons of her receiving the COVID-19 vaccine. At this time, there is limited data on CVID patients and COVID-19 vaccinations but the consensus has been that CVID patients should receive them as it may offer them some protection which is better than none.

## 2019-09-26 NOTE — Patient Instructions (Addendum)
CVID: Get bloodwork:  We are ordering labs, so please allow 1-2 weeks for the results to come back. With the newly implemented Cures Act, the labs might be visible to you at the same time that they become visible to me. However, I will not address the results until all of the results are back, so please be patient.   Continue with Hinzetra injections for now.  Will contact our coordinator to see if you would quality for another injection.   Keep track of infections.  And if you have another infection then let us know.   Recommend COVID-19 vaccination when you are feeling better.   Asthma/COPD: . STOP smoking  . Get CXR. Marland Kitchen For the next 7 days, use your albuterol nebulizer twice a day before using the Breztri. . Daily controller medication(s): start Breztri 2 puffs twice a day with spacer and rinse mouth afterwards. Sample given. Demonstrated proper use.  o Stop Symbicort for now.  . Prior to physical activity: May use albuterol rescue inhaler 2 puffs 5 to 15 minutes prior to strenuous physical activities. Marland Kitchen Rescue medications: May use albuterol rescue inhaler 2 puffs or nebulizer every 4 to 6 hours as needed for shortness of breath, chest tightness, coughing, and wheezing. Monitor frequency of use.  . Asthma control goals:  o Full participation in all desired activities (may need albuterol before activity) o Albuterol use two times or less a week on average (not counting use with activity) o Cough interfering with sleep two times or less a month o Oral steroids no more than once a year o No hospitalizations  Rhinitis:  May use over the counter antihistamines such as Xyzal (levocetirizine) daily.  Stop Claritin.   Continue with Singulair (montelukast) 10mg  daily at night.  Use Fluticaonse 1 spray per nostril twice a day for nasal congestion.  Use azelastine 1-2 sprays per nostril twice a day for drainage.  Nasal saline spray (i.e., Simply Saline) or nasal saline lavage (i.e.,  NeilMed) is recommended as needed and prior to medicated nasal sprays.  Will get bloodwork to check environmental allergies as well.   Follow up in 4 weeks with me in [place].  Buffered Isotonic Saline Irrigations:  Goal: . When you irrigate with the isotonic saline (salt water) it washes mucous and other debris from your nose that could be contributing to your nasal symptoms.   Recipe: Obtain 1 quart jar that is clean . Fill with clean (bottled, boiled or distilled) water . Add 1-2 heaping teaspoons of salt without iodine o If the solution with 2 teaspoons of salt is too strong, adjust the amount down until better tolerated . Add 1 teaspoon of [employer] baking soda (pure bicarbonate) . Mix ingredients together and store at room temperature and discard after 1 week * Alternatively you can buy pre made salt packets for the NeilMed bottle or there          are other over the counter brands available  Instructions: . Warm  cup of the solution in the microwave if desired but be careful not to overheat as this will burn the inside of your nose . Stand over a sink (or do it while you shower) and squirt the solution into one side of your nose aiming towards the back of your head o Sometimes saying "coca cola" while irrigating can be helpful to prevent fluid from going down your throat  . The solution will travel to the back of your nose and then  come out the other side . Perform this again on the other side . Try to do this twice a day . If you are using a nasal spray in addition to the irrigation, irrigate first and then use the topical nasal spray otherwise you will wash the nasal spray out of your nose

## 2019-09-26 NOTE — Assessment & Plan Note (Addendum)
Patient used to follow with pulmonology as well. Had bronchiectasis on CT chest and failed to follow up. Still smoking 1.5 pack per day which is less than her previous of 3 packs per day. Using Symbicort 160 2 puffs every few hours and albuterol on a daily basis due to coughing, wheezing and shortness of breath.  Patient's symptoms are most likely stemming from a combination of asthma, COPD, bronchiectasis, and continued tobacco exposure.   Discussed at length the importance of smoking cessation especially with having CVID with bronchiectasis.   Today's spirometry showed: restriction with 11% improvement in FEV1 post bronchodilator treatment  ACT score 6.  . Get CXR now and will need repeat CT chest.  o Recommend that she follows up with pulmonology as well.  . For the next 7 days, use albuterol nebulizer twice a day before using the Breztri. . Daily controller medication(s): start Breztri 2 puffs twice a day with spacer and rinse mouth afterwards. Sample given. Demonstrated proper use.  o Stop Symbicort for now.  . Prior to physical activity: May use albuterol rescue inhaler 2 puffs 5 to 15 minutes prior to strenuous physical activities. Marland Kitchen Rescue medications: May use albuterol rescue inhaler 2 puffs or nebulizer every 4 to 6 hours as needed for shortness of breath, chest tightness, coughing, and wheezing. Monitor frequency of use.  . Repeat spirometry at next visit.

## 2019-09-26 NOTE — Assessment & Plan Note (Signed)
Complaining of continual rhinitis symptoms. No recent allergy testing.  May use over the counter antihistamines such as Xyzal (levocetirizine) daily.  Stop Claritin as ineffective.   Continue with Singulair (montelukast) 10mg  daily at night.  Use Fluticasone 1 spray per nostril twice a day for nasal congestion.  Use azelastine 1-2 sprays per nostril twice a day for drainage.  Nasal saline spray (i.e., Simply Saline) or nasal saline lavage (i.e., NeilMed) is recommended as needed and prior to medicated nasal sprays.  Gave instruction for saline lavage.  Will get bloodwork to check environmental allergies as well.

## 2019-09-29 ENCOUNTER — Encounter: Payer: Self-pay | Admitting: Allergy

## 2019-09-29 NOTE — Progress Notes (Addendum)
Received lab results from [facility]. CMP - unremarkable. CBC diff - WBC 12.6, neutrophil abs 9.3, monocyte abs 1.  Most likely due to recent prednisone use.  Rest pending?

## 2019-10-01 ENCOUNTER — Telehealth: Payer: Self-pay | Admitting: Allergy

## 2019-10-01 NOTE — Telephone Encounter (Signed)
Patient wants to know if she is getting a prescription for xyzal and  brezetri  And she is  Waiting to hear about test results   Pharmacy  [facility]

## 2019-10-02 ENCOUNTER — Telehealth: Payer: Self-pay | Admitting: Allergy

## 2019-10-02 MED ORDER — LEVOCETIRIZINE DIHYDROCHLORIDE 5 MG PO TABS
5.0000 mg | ORAL_TABLET | Freq: Every day | ORAL | 5 refills | Status: DC | PRN
Start: 2019-10-02 — End: 2020-03-08

## 2019-10-02 NOTE — Telephone Encounter (Addendum)
Called patient to inform her the Markus Daft was sent in to [facility] on 09/26/19.  Patient called [facility] and they said they received the rx but Markus Daft is not covered under Medicaid.   Xyzal was not sent in per [facility] pharmacy. Patient states Xyzal is working very well.    Will sent in rx for Xyzal.  Sent in PA via [place] [employer] for [employer].  Medicaid formulary drugs are brand name Advair, Dulera and Symbicort.  Patient has tried Symbicort.  Informed patient per Dr. Selena Batten, we are waiting on all labs to complete, then we will call patient with results once Dr. Selena Batten reviews. Patient verbalized understanding and will wait for lab results and PA on medications.

## 2019-10-02 NOTE — Telephone Encounter (Addendum)
Patient came by clinic after lunch.  Gave samples of Xyzal and Breztri.  Patient contacted [facility].  They did get rx for Xyzal and will deliver to patients home.  Still waiting on Medicaid ([employer]) for PA on [place]. Patient states she has tried both Symbicort and Advair in the past.

## 2019-10-02 NOTE — Telephone Encounter (Addendum)
Patient returned call.  Gave information per Dr. Selena Batten on CXR and informed still waiting on lab results and will get a call once all lab results are completed and reviewed by Dr. Selena Batten.  Patient verbalized understanding.

## 2019-10-02 NOTE — Telephone Encounter (Addendum)
Patient did get the CXR at [facility].   [facility] for lab information. IgG, IgA, IgM was sent out to [facility] preferred lab, [facility].  These tests were put in incorrectly (order had [facility] test codes) and this is why we are getting wrong test results.  Spoke with Erskine Squibb, Production designer, theatre/television/film at main lab at [facility].  She called [facility] and was able to get the correct test that was ordered (quantitative IgG,IgA,IgM). [facility] will check to see if they have enough blood sample to run test.  If so, they will perform correct test.  Allergens w/Total IgE Area 2 was sent out to [facility] and is still pending. Erskine Squibb will notifiy [place] if [facility] does not have enough blood sample.   Checked on CXR.  CXR results are in Epic in Care Everywhere.  Sent this information to Dr. Selena Batten.

## 2019-10-02 NOTE — Telephone Encounter (Signed)
Left message on patients voice mail.  Levocetirizine was sent in to [facility] pharmacy.  No PA was needed per Medicaid.  A PA was needed and initiated via [place] [employer] for [employer].  Waiting on PA decision. Addressed test results in a telephone contact from Dr Selena Batten today.

## 2019-10-02 NOTE — Telephone Encounter (Signed)
Left message on patients voice mail to call clinic to go over CXR results per Dr. Selena Batten.

## 2019-10-02 NOTE — Telephone Encounter (Signed)
Can someone call [facility] [phone])?  They have been faxing over one lab at a time but I received a lab that I didn't order: Beta 2 GP1 Ab IgG, S Beta 2 GP1 Ab IgM, S  These are the labs I ordered: CBC diff - received. CMP - received. IgG, IgA, IgM - not received - check on status Allergens with total IgE area 2 - check on status  Also, she should have gotten a CXR done the same day I didn't get the results for.  Thank you.

## 2019-10-02 NOTE — Telephone Encounter (Signed)
Please call patient.  Chest Xray showed no acute disease. She does have hyperinflation and mild peribronchial thickening which is probably due to COPD.   I'm still waiting on lab results and will let her know once I receive them.   Thank you.

## 2019-10-03 NOTE — Telephone Encounter (Signed)
PA still pending per [place] [employer] for [employer].

## 2019-10-07 NOTE — Telephone Encounter (Signed)
Called [place] [employer] operations center to check on status of Breztri.  Call center number [phone].  Per [place] [employer], in order for Breztri to be approved, patient must try and fail two of these three drugs: Advair Dulera Symbicort  Patient has tried and failed Symbicort per chart and patient verbally states she has been on Advair in the past with therapeutic failure. Gave information to [employer] over the phone.  Markus Daft is approved.  ID #[id number]  Called pharmacy and patient with approval information.

## 2019-10-24 ENCOUNTER — Ambulatory Visit: Payer: Medicaid Other | Admitting: Allergy

## 2019-10-26 LAB — CBC WITH DIFFERENTIAL/PLATELET
Basophils Absolute: 0.1 10*3/uL (ref 0.0–0.2)
Basos: 1 %
EOS (ABSOLUTE): 0.1 10*3/uL (ref 0.0–0.4)
Eos: 2 %
Hematocrit: 42.4 % (ref 34.0–46.6)
Hemoglobin: 13.9 g/dL (ref 11.1–15.9)
Immature Grans (Abs): 0 10*3/uL (ref 0.0–0.1)
Immature Granulocytes: 0 %
Lymphocytes Absolute: 2.4 10*3/uL (ref 0.7–3.1)
Lymphs: 29 %
MCH: 29.3 pg (ref 26.6–33.0)
MCHC: 32.8 g/dL (ref 31.5–35.7)
MCV: 89 fL (ref 79–97)
Monocytes Absolute: 0.6 10*3/uL (ref 0.1–0.9)
Monocytes: 7 %
Neutrophils Absolute: 5 10*3/uL (ref 1.4–7.0)
Neutrophils: 61 %
Platelets: 490 10*3/uL — ABNORMAL HIGH (ref 150–450)
RBC: 4.75 x10E6/uL (ref 3.77–5.28)
RDW: 13.6 % (ref 11.7–15.4)
WBC: 8.3 10*3/uL (ref 3.4–10.8)

## 2019-10-26 LAB — ALLERGENS W/TOTAL IGE AREA 2
Alternaria Alternata IgE: <0.1 kU/L
Aspergillus Fumigatus IgE: <0.1 kU/L
Bermuda Grass IgE: 0.11 kU/L — AB
Cat Dander IgE: 0.79 kU/L — AB
Cedar, Mountain IgE: 0.9 kU/L — AB
Cladosporium Herbarum IgE: <0.1 kU/L
Cockroach, German IgE: <0.1 kU/L
Common Silver Birch IgE: 0.38 kU/L — AB
Cottonwood IgE: <0.1 kU/L
D Farinae IgE: <0.1 kU/L
D Pteronyssinus IgE: <0.1 kU/L
Dog Dander IgE: 0.23 kU/L — AB
Elm, American IgE: <0.1 kU/L
IgE (Immunoglobulin E), Serum: 458 IU/mL (ref 6–495)
Johnson Grass IgE: 0.1 kU/L — AB
Maple/Box Elder IgE: <0.1 kU/L
Mouse Urine IgE: <0.1 kU/L
Oak, White IgE: 0.98 kU/L — AB
Pecan, Hickory IgE: <0.1 kU/L
Penicillium Chrysogen IgE: <0.1 kU/L
Pigweed, Rough IgE: <0.1 kU/L
Ragweed, Short IgE: 3.82 kU/L — AB
Sheep Sorrel IgE Qn: <0.1 kU/L
Timothy Grass IgE: 6.16 kU/L — AB
White Mulberry IgE: <0.1 kU/L

## 2019-10-26 LAB — IGG, IGA, IGM
IgA/Immunoglobulin A, Serum: 157 mg/dL (ref 87–352)
IgG (Immunoglobin G), Serum: 792 mg/dL (ref 586–1602)
IgM (Immunoglobulin M), Srm: 60 mg/dL (ref 26–217)

## 2019-10-26 LAB — COMPREHENSIVE METABOLIC PANEL
ALT: 18 IU/L (ref 0–32)
AST: 14 IU/L (ref 0–40)
Albumin/Globulin Ratio: 1.7 (ref 1.2–2.2)
Albumin: 4.2 g/dL (ref 3.8–4.8)
Alkaline Phosphatase: 113 IU/L (ref 48–121)
BUN/Creatinine Ratio: 16 (ref 12–28)
BUN: 13 mg/dL (ref 8–27)
Bilirubin Total: 0.2 mg/dL (ref 0.0–1.2)
CO2: 25 mmol/L (ref 20–29)
Calcium: 9.6 mg/dL (ref 8.7–10.3)
Chloride: 102 mmol/L (ref 96–106)
Creatinine, Ser: 0.79 mg/dL (ref 0.57–1.00)
GFR calc Af Amer: 92 mL/min/{1.73_m2} (ref >59)
GFR calc non Af Amer: 80 mL/min/{1.73_m2} (ref >59)
Globulin, Total: 2.5 g/dL (ref 1.5–4.5)
Glucose: 124 mg/dL — ABNORMAL HIGH (ref 65–99)
Potassium: 4.1 mmol/L (ref 3.5–5.2)
Sodium: 139 mmol/L (ref 134–144)
Total Protein: 6.7 g/dL (ref 6.0–8.5)

## 2019-10-26 LAB — SEDIMENTATION RATE: Sed Rate: 36 mm/hr (ref 0–40)

## 2019-10-28 NOTE — Progress Notes (Signed)
Follow Up Note  RE: [name] MRN: [id number] DOB: Dec 19, 1955 Date of Office Visit: 10/29/2019  Referring provider: List, Rosana Fret, [place] Primary care provider: List, Rosana Fret, [place]  Chief Complaint: Asthma (sx overall improved since last OV.  still some cough), Cough, and Nasal Congestion (and drainage)  History of Present Illness: I had the pleasure of seeing Kristi Allen for a follow up visit at the [facility] on 10/30/2019. She is a 64 y.o. female, who is being followed for CVID, asthma/COPD, chronic rhinitis. Her previous allergy office visit was on 09/26/2019 with Dr. Maudie Mercury. Today is a regular follow up visit.  CVID (common variable immunodeficiency) ([place]) Patient was placed on antibiotics for cellulitis. Finished clindamycin and now on doxycycline. Doing Hizentra injections 7gm every 7 days at home. Sometimes she still gets behind her injections.  Asthma-COPD overlap syndrome (HCC) Still smoking 1.5 pack per day. Currently doing Breztri 2 puffs twice a day with spacer and rinsing mouth afterwards which is helping. Using albuterol HFA twice daily and albuterol nebulizer every 3-4 hours still.  If not using it that often then she gets coughing spells, wheezing and shortness of breath.   No additional prednisone.   Chronic rhinitis 2021 bloodwork positive to cat, dog, grass, trees, ragweed.  Currently controlled with Xyzal, Singulair, Flonase and azelastine.   Assessment and Plan: [name] is a 64 y.o. female with: CVID (common variable immunodeficiency) ([place]) Past history - Patient was diagnosed with CVID prior to coming to our office and has been on weekly Hizentra injections with good benefit. She mentions skipping few doses at times due to inability to self-administer due to her coughing fits. Patient had bronchiectasis on CT chest in past. Still smoking 1.5 pack per day.  Interim history - on antibiotics for cellulitis. Receiving Hizentra 7gm every 7 days.  IgG level 792.   Switch from Hizentra to [facility] which will be less frequent dosing and hopefully this will increase compliance.  Week 1 - 7g, week 2 - 14g, week 4 - 21g, week 7 - 28g and then Q4 weeks with 28g.  Finish antibiotics.   Keep track of infections.  And if you have another infection then let us know.   Recommend COVID-19 vaccination.  Recheck IgG levels at next visit and may need to adjust dosing depending on IgG levels.  Asthma-COPD overlap syndrome ([place]) Past history - Patient used to follow with pulmonology as well. Had bronchiectasis on CT chest and failed to follow up. Still smoking 1.5 pack per day which is less than her previous of 3 packs per day. Using Symbicort 160 2 puffs every few hours and albuterol on a daily basis due to coughing, wheezing and shortness of breath. Interim history - Doing much better with Breztri but still using albuterol throughout the day and still smoking 1.5 pack per day. CXR no acute process. . Today's spirometry improved from previous one.  . Strongly encouraged smoking cessation.  . Daily controller medication(s): continue Breztri 2 puffs twice a day with spacer and rinse mouth afterwards.  . Prior to physical activity: May use albuterol rescue inhaler 2 puffs 5 to 15 minutes prior to strenuous physical activities. Marland Kitchen Rescue medications: May use albuterol rescue inhaler 2 puffs or nebulizer every 4 to 6 hours as needed for shortness of breath, chest tightness, coughing, and wheezing. Monitor frequency of use.   Patient's symptoms are most likely stemming from a combination of asthma, COPD, bronchiectasis, and continued tobacco exposure.  Marland Kitchen  Repeat spirometry at next visit.  . Check eosinophil counts to see if she meets criteria for biologics for better asthma control.  . Check alpha-1 level.   Seasonal and perennial allergic rhinitis 2021 bloodwork positive to cat, dog, grass, trees, ragweed.   May use over the counter antihistamines  such as Xyzal (levocetirizine) 5mg  daily.  Continue with Singulair (montelukast) 10mg  daily at night.  Use Fluticasone 1 spray per nostril twice a day for nasal congestion.  Use azelastine 1-2 sprays per nostril twice a day for drainage.  Nasal saline spray (i.e., Simply Saline) or nasal saline lavage (i.e., NeilMed) is recommended as needed and prior to medicated nasal sprays.  Continue environmental control measures.   Return in about 2 months (around 01/02/2020).  Meds ordered this encounter  Medications  . albuterol (VENTOLIN HFA) 108 (90 Base) MCG/ACT inhaler    Sig: Inhale 2-4 puffs into the lungs every 4 (four) hours as needed for wheezing or shortness of breath (coughing fits).    Dispense:  18 g    Refill:  2    Lab Orders     CBC with Differential/Platelet     Alpha-1-antitrypsin  Diagnostics: Spirometry:  Tracings reviewed. Her effort: It was hard to get consistent efforts and there is a question as to whether this reflects a maximal maneuver. FVC: 2.17L FEV1: 1.84L, 88% predicted FEV1/FVC ratio: 85% Interpretation: Spirometry consistent with possible restrictive disease.  Please see scanned spirometry results for details.  Medication List:  Current Outpatient Medications  Medication Sig Dispense Refill  . albuterol (PROVENTIL) (2.5 MG/3ML) 0.083% nebulizer solution Take 2.5 mg by nebulization every 6 (six) hours as needed for wheezing or shortness of breath.    albuterol (VENTOLIN HFA) 108 (90 Base) MCG/ACT inhaler Inhale 2-4 puffs into the lungs every 4 (four) hours as needed for wheezing or shortness of breath (coughing fits). 18 g 2  . amLODipine (NORVASC) 10 MG tablet Take 10 mg by mouth daily.    01/04/2020 atorvastatin (LIPITOR) 10 MG tablet Take 10 mg by mouth daily.    . Azelastine HCl 0.15 % SOLN Place 1-2 sprays into the nose in the morning and at bedtime. For drainage 30 mL 2  . Budeson-Glycopyrrol-Formoterol (BREZTRI AEROSPHERE) 160-9-4.8 MCG/ACT AERO Inhale  2 sprays into the lungs in the morning and at bedtime. with spacer and rinse mouth afterwards. 10.7 g 2  . doxycycline (VIBRAMYCIN) 100 MG capsule Take 100 mg by mouth 2 (two) times daily. One capsule twice a day x 10 days for infection in leg    . DULoxetine (CYMBALTA) 60 MG capsule 2 capsules daily.    . fluticasone (FLONASE) 50 MCG/ACT nasal spray Place 1 spray into both nostrils in the morning and at bedtime. For drainage 16 g 2  . hydrochlorothiazide (HYDRODIURIL) 25 MG tablet Take 25 mg by mouth daily.    . Immune Globulin, Human, (HIZENTRA [place]) Inject 7 g into the skin once a week. Hizentra 20%  7 grams (35 ml) one time every week.    Marland Kitchen ipratropium-albuterol (DUONEB) 0.5-2.5 (3) MG/3ML SOLN Take 3 mLs by nebulization every 6 (six) hours as needed. 360 mL 5  . levocetirizine (XYZAL) 5 MG tablet Take 1 tablet (5 mg total) by mouth daily as needed for allergies. 30 tablet 5  . losartan (COZAAR) 50 MG tablet Take 50 mg by mouth daily.    . montelukast (SINGULAIR) 10 MG tablet Take 1 tablet (10 mg total) by mouth at bedtime. 30 tablet 2  .  Olopatadine HCl 0.2 % SOLN 1 drop Two (2) times a day.    Marland Kitchen omeprazole (PRILOSEC) 40 MG capsule Take 40 mg by mouth daily.    Marland Kitchen Respiratory Therapy Supplies (FLUTTER) DEVI 1 Device by Does not apply route as directed. 1 each 0  . simvastatin (ZOCOR) 80 MG tablet 1 tablet daily.    Marland Kitchen tiotropium (SPIRIVA) 18 MCG inhalation capsule Frequency:as needed   Dosage:18   MCG  Instructions:Spiriva HandiHaler ( CAPS, Inhalation as needed)  Note:    . loratadine (CLARITIN) 10 MG tablet Take 1 tablet (10 mg total) by mouth daily. 30 tablet 2   No current facility-administered medications for this visit.   Allergies: Allergies  Allergen Reactions  . Doxycycline Rash  . Cyclobenzaprine   . Hydrocortisone     Breaks out in hives with topical cream.   . Sulfamethoxazole-Trimethoprim Swelling  . Amoxicillin-Pot Clavulanate Nausea Only  . Codeine Rash    Other  reaction(s): RASH   I reviewed her past medical history, social history, family history, and environmental history and no significant changes have been reported from her previous visit.  Review of Systems  Constitutional: Negative for appetite change, chills, fever and unexpected weight change.  HENT: Negative for congestion and rhinorrhea.   Eyes: Negative for itching.  Respiratory: Positive for cough, chest tightness, shortness of breath and wheezing.   Gastrointestinal: Negative for abdominal pain.  Skin: Negative for rash.  Allergic/Immunologic: Positive for environmental allergies.  Neurological: Negative for headaches.   Objective: BP 132/64 (BP Location: Right Arm, Patient Position: Sitting, Cuff Size: Normal)   Pulse 84   Temp (!) 97.4 F (36.3 C) (Oral)   Resp 20   Wt 138 lb (62.6 kg)   SpO2 98%   BMI 26.95 kg/m  Body mass index is 26.95 kg/m. Physical Exam Vitals and nursing note reviewed.  Constitutional:      Appearance: Normal appearance. She is well-developed.  HENT:     Head: Normocephalic and atraumatic.     Right Ear: Tympanic membrane and external ear normal.     Left Ear: Tympanic membrane and external ear normal.     Nose: No mucosal edema, congestion or rhinorrhea.     Mouth/Throat:     Mouth: Mucous membranes are moist.     Pharynx: Oropharynx is clear.  Eyes:     Conjunctiva/sclera: Conjunctivae normal.  Cardiovascular:     Rate and Rhythm: Normal rate and regular rhythm.     Heart sounds: Normal heart sounds. No murmur heard.  No friction rub. No gallop.   Pulmonary:     Effort: Pulmonary effort is normal.     Breath sounds: Normal breath sounds. No wheezing, rhonchi or rales.  Musculoskeletal:     Cervical back: Neck supple.  Skin:    General: Skin is warm.     Findings: No rash.  Neurological:     Mental Status: She is alert and oriented to person, place, and time.  Psychiatric:        Behavior: Behavior normal.    Previous notes and  tests were reviewed. The plan was reviewed with the patient/family, and all questions/concerned were addressed.  It was my pleasure to see Kristi Allen today and participate in her care. Please feel free to contact me with any questions or concerns.  Sincerely,  Wyline Mood, DO Allergy & Immunology  [facility] office: [phone] [facility] office: [phone] [facility] office: [phone]  40 minutes  spent face-to-face with more than 50% of the time spent discussing CVID, IgG replacement, asthma/copd, allergic rhinitis, smoking cessation.

## 2019-10-29 ENCOUNTER — Encounter: Payer: Self-pay | Admitting: Allergy

## 2019-10-29 ENCOUNTER — Other Ambulatory Visit: Payer: Self-pay

## 2019-10-29 ENCOUNTER — Ambulatory Visit (INDEPENDENT_AMBULATORY_CARE_PROVIDER_SITE_OTHER): Payer: Medicaid Other | Admitting: Allergy

## 2019-10-29 VITALS — BP 132/64 | HR 84 | Temp 97.4°F | Resp 20 | Wt 138.0 lb

## 2019-10-29 DIAGNOSIS — D839 Common variable immunodeficiency, unspecified: Secondary | ICD-10-CM | POA: Diagnosis not present

## 2019-10-29 DIAGNOSIS — J449 Chronic obstructive pulmonary disease, unspecified: Secondary | ICD-10-CM

## 2019-10-29 DIAGNOSIS — J3089 Other allergic rhinitis: Secondary | ICD-10-CM | POA: Diagnosis not present

## 2019-10-29 DIAGNOSIS — J302 Other seasonal allergic rhinitis: Secondary | ICD-10-CM

## 2019-10-29 MED ORDER — ALBUTEROL SULFATE HFA 108 (90 BASE) MCG/ACT IN AERS: 2.0000 | INHALATION_SPRAY | RESPIRATORY_TRACT | 2 refills | Status: DC | PRN

## 2019-10-29 NOTE — Patient Instructions (Addendum)
CVID:  Switch from Hizentra to [facility] which will be less frequent dosing.  Meanwhile continue with Hizentra weekly injections.   Finish antibiotics.   Keep track of infections.  And if you have another infection then let us know.   Recommend COVID-19 vaccination when you are feeling better - or you can wait until our office has it.   Asthma/COPD:  Decrease smoking - smoke 1 less cigarette per week so hopefully by the next time I see you you will be down to 1 pack per day!  Daily controller medication(s): continue Breztri 2 puffs twice a day with spacer and rinse mouth afterwards.   Prior to physical activity: May use albuterol rescue inhaler 2 puffs 5 to 15 minutes prior to strenuous physical activities.  Rescue medications: May use albuterol rescue inhaler 2 puffs or nebulizer every 4 to 6 hours as needed for shortness of breath, chest tightness, coughing, and wheezing. Monitor frequency of use.   Asthma control goals:  o Full participation in all desired activities (may need albuterol before activity) o Albuterol use two times or less a week on average (not counting use with activity) o Cough interfering with sleep two times or less a month o Oral steroids no more than once a year o No hospitalizations  Environmental allergies:  2021 bloodwork positive to cat, dog, grass, trees, ragweed.  May use over the counter antihistamines such as Xyzal (levocetirizine) 5mg  daily.  Continue with Singulair (montelukast) 10mg  daily at night.  Use Fluticasone 1 spray per nostril twice a day for nasal congestion.  Use azelastine 1-2 sprays per nostril twice a day for drainage.  Nasal saline spray (i.e., Simply Saline) or nasal saline lavage (i.e., NeilMed) is recommended as needed and prior to medicated nasal sprays.  Continue environmental control measures.  Follow up in August with me in [place]. Get bloodwork 2 weeks after you are finished with your antibiotics.   Pet Allergen  Avoidance:  Contrary to popular opinion, there are no hypoallergenic breeds of dogs or cats. That is because people are not allergic to an animals hair, but to an allergen found in the animal's saliva, dander (dead skin flakes) or urine. Pet allergy symptoms typically occur within minutes. For some people, symptoms can build up and become most severe 8 to 12 hours after contact with the animal. People with severe allergies can experience reactions in public places if dander has been transported on the pet owners clothing.  Keeping an animal outdoors is only a partial solution, since homes with pets in the yard still have higher concentrations of animal allergens.  Before getting a pet, ask your allergist to determine if you are allergic to animals. If your pet is already considered part of your family, try to minimize contact and keep the pet out of the bedroom and other rooms where you spend a great deal of time.  As with dust mites, vacuum carpets often or replace carpet with a hardwood floor, tile or linoleum.  High-efficiency particulate air (HEPA) cleaners can reduce allergen levels over time.  While dander and saliva are the source of cat and dog allergens, urine is the source of allergens from rabbits, hamsters, mice and [place] pigs; so ask a non-allergic family member to clean the animals cage.  If you have a pet allergy, talk to your allergist about the potential for allergy immunotherapy (allergy shots). This strategy can often provide long-term relief. Reducing Pollen Exposure  Pollen seasons: trees (spring), grass (summer) and ragweed/weeds (fall).  Keep windows closed in your home and car to lower pollen exposure.   Install air conditioning in the bedroom and throughout the house if possible.   Avoid going out in dry windy days - especially early morning.  Pollen counts are highest between 5 - 10 AM and on dry, hot and windy days.   Save outside activities for late  afternoon or after a heavy rain, when pollen levels are lower.   Avoid mowing of grass if you have grass pollen allergy.  Be aware that pollen can also be transported indoors on people and pets.   Dry your clothes in an automatic dryer rather than hanging them outside where they might collect pollen.   Rinse hair and eyes before bedtime.

## 2019-10-30 ENCOUNTER — Telehealth: Payer: Self-pay | Admitting: Allergy

## 2019-10-30 ENCOUNTER — Telehealth: Payer: Self-pay | Admitting: *Deleted

## 2019-10-30 DIAGNOSIS — J302 Other seasonal allergic rhinitis: Secondary | ICD-10-CM | POA: Insufficient documentation

## 2019-10-30 NOTE — Assessment & Plan Note (Addendum)
Past history - Patient used to follow with pulmonology as well. Had bronchiectasis on CT chest and failed to follow up. Still smoking 1.5 pack per day which is less than her previous of 3 packs per day. Using Symbicort 160 2 puffs every few hours and albuterol on a daily basis due to coughing, wheezing and shortness of breath. Interim history - Doing much better with Breztri but still using albuterol throughout the day and still smoking 1.5 pack per day. CXR no acute process. . Today's spirometry improved from previous one.  . Strongly encouraged smoking cessation.  . Daily controller medication(s): continue Breztri 2 puffs twice a day with spacer and rinse mouth afterwards.  . Prior to physical activity: May use albuterol rescue inhaler 2 puffs 5 to 15 minutes prior to strenuous physical activities. Marland Kitchen Rescue medications: May use albuterol rescue inhaler 2 puffs or nebulizer every 4 to 6 hours as needed for shortness of breath, chest tightness, coughing, and wheezing. Monitor frequency of use.   Patient's symptoms are most likely stemming from a combination of asthma, COPD, bronchiectasis, and continued tobacco exposure.  . Repeat spirometry at next visit.  . Check eosinophil counts to see if she meets criteria for biologics for better asthma control.  . Check alpha-1 level.

## 2019-10-30 NOTE — Telephone Encounter (Signed)
Patient states she needs prescription for tubes for nebulizer to [facility]- [phone] in [place].

## 2019-10-30 NOTE — Assessment & Plan Note (Signed)
2021 bloodwork positive to cat, dog, grass, trees, ragweed.   May use over the counter antihistamines such as Xyzal (levocetirizine) 5mg  daily.  Continue with Singulair (montelukast) 10mg  daily at night.  Use Fluticasone 1 spray per nostril twice a day for nasal congestion.  Use azelastine 1-2 sprays per nostril twice a day for drainage.  Nasal saline spray (i.e., Simply Saline) or nasal saline lavage (i.e., NeilMed) is recommended as needed and prior to medicated nasal sprays.  Continue environmental control measures.

## 2019-10-30 NOTE — Telephone Encounter (Signed)
L/m for patient to contact me regarding change over to [facility] from Hizentra and submit

## 2019-10-30 NOTE — Assessment & Plan Note (Signed)
Past history - Patient was diagnosed with CVID prior to coming to our office and has been on weekly Hizentra injections with good benefit. She mentions skipping few doses at times due to inability to self-administer due to her coughing fits. Patient had bronchiectasis on CT chest in past. Still smoking 1.5 pack per day.  Interim history - on antibiotics for cellulitis. Receiving Hizentra 7gm every 7 days. IgG level 792.   Switch from Hizentra to [facility] which will be less frequent dosing and hopefully this will increase compliance.  Week 1 - 7g, week 2 - 14g, week 4 - 21g, week 7 - 28g and then Q4 weeks with 28g.  Finish antibiotics.   Keep track of infections.  And if you have another infection then let us know.   Recommend COVID-19 vaccination.  Recheck IgG levels at next visit and may need to adjust dosing depending on IgG levels.

## 2019-10-30 NOTE — Telephone Encounter (Signed)
-----   Message from Ellamae Sia, DO sent at 10/29/2019  4:22 PM EDT ----- Can you start the paperwork for Hyquvia?  Week 1 - 7 g Week 2 - 14g Week 4 - 21g Week 7 - 28g  Every 4 weeks afterwards she will be receiving 28g.   Thank you.  ----- Message ----- From: Devoria Glassing, CMA Sent: 10/16/2019   2:21 PM EDT To: Ellamae Sia, DO  Dr Selena Batten, I just got the renewal for patient's Hizentra.  Do you still want to make the change and if so which one do you want to change to and dose.  She is currently on 7 grams weekly. [place] ----- Message ----- From: Ellamae Sia, DO Sent: 10/01/2019   8:16 AM EDT To: Devoria Glassing, CMA  Let me look into which one I would prefer. I'm waiting on some labs for her before we change.  Thank you. ----- Message ----- From: Devoria Glassing, CMA Sent: 09/29/2019   9:28 AM EDT To: Ellamae Sia, DO  She has MCD so I dont think there is issue with either. [place] ----- Message ----- From: Ellamae Sia, DO Sent: 09/26/2019   2:23 PM EDT To: Devoria Glassing, CMA  Nanie, I'm looking to switch this patient from Hizentra to either Cuvitru or Hyquvia which requires less frequent dosing. Can you tell me which ones would her insurance cover? Thank you.

## 2019-10-31 NOTE — Telephone Encounter (Signed)
Spoke to patient and advised submit for Hyqvia to [facility] and [facility]

## 2019-11-19 LAB — CBC WITH DIFFERENTIAL/PLATELET
Basophils Absolute: 0.1 10*3/uL (ref 0.0–0.2)
Basos: 1 %
EOS (ABSOLUTE): 0.1 10*3/uL (ref 0.0–0.4)
Eos: 1 %
Hematocrit: 43.5 % (ref 34.0–46.6)
Hemoglobin: 14.6 g/dL (ref 11.1–15.9)
Immature Grans (Abs): 0 10*3/uL (ref 0.0–0.1)
Immature Granulocytes: 0 %
Lymphocytes Absolute: 2.5 10*3/uL (ref 0.7–3.1)
Lymphs: 23 %
MCH: 29.9 pg (ref 26.6–33.0)
MCHC: 33.6 g/dL (ref 31.5–35.7)
MCV: 89 fL (ref 79–97)
Monocytes Absolute: 0.7 10*3/uL (ref 0.1–0.9)
Monocytes: 6 %
Neutrophils Absolute: 7.6 10*3/uL — ABNORMAL HIGH (ref 1.4–7.0)
Neutrophils: 69 %
Platelets: 354 10*3/uL (ref 150–450)
RBC: 4.88 x10E6/uL (ref 3.77–5.28)
RDW: 14 % (ref 11.7–15.4)
WBC: 11 10*3/uL — ABNORMAL HIGH (ref 3.4–10.8)

## 2019-11-19 LAB — ALPHA-1-ANTITRYPSIN: A-1 Antitrypsin: 182 mg/dL (ref 101–187)

## 2019-11-20 NOTE — Telephone Encounter (Signed)
This never came thru to the nurses I have spoken with the pt and have a set of tubes waiting on her at front desk to pick up also [facility] is not handling the nebulizer any longer as it has been over 3 years since they ddi anything for her. [facility] has a form for [place] to fill out and have you sign and fax to them to get them to get her new tubing and nebulizer. Will complete form and all request and fax for you to sign and then have a nurse fax to [facility] at [phone]

## 2019-11-20 NOTE — Telephone Encounter (Signed)
Signed and faxed

## 2019-11-20 NOTE — Telephone Encounter (Signed)
Can you fax to [facility] [phone] so I can sign?   Thank you.

## 2019-11-20 NOTE — Telephone Encounter (Signed)
Was this taken care of?

## 2019-11-20 NOTE — Telephone Encounter (Signed)
I have faxed it to [facility]

## 2019-12-02 ENCOUNTER — Encounter: Payer: Self-pay | Admitting: Family Medicine

## 2019-12-02 ENCOUNTER — Other Ambulatory Visit: Payer: Self-pay

## 2019-12-02 ENCOUNTER — Ambulatory Visit (INDEPENDENT_AMBULATORY_CARE_PROVIDER_SITE_OTHER): Payer: Medicaid Other | Admitting: Family Medicine

## 2019-12-02 VITALS — BP 140/60 | HR 80 | Temp 97.9°F | Resp 18

## 2019-12-02 DIAGNOSIS — K219 Gastro-esophageal reflux disease without esophagitis: Secondary | ICD-10-CM

## 2019-12-02 DIAGNOSIS — J3089 Other allergic rhinitis: Secondary | ICD-10-CM

## 2019-12-02 DIAGNOSIS — J449 Chronic obstructive pulmonary disease, unspecified: Secondary | ICD-10-CM | POA: Diagnosis not present

## 2019-12-02 DIAGNOSIS — R059 Cough, unspecified: Secondary | ICD-10-CM | POA: Insufficient documentation

## 2019-12-02 DIAGNOSIS — R05 Cough: Secondary | ICD-10-CM

## 2019-12-02 DIAGNOSIS — D839 Common variable immunodeficiency, unspecified: Secondary | ICD-10-CM | POA: Diagnosis not present

## 2019-12-02 DIAGNOSIS — Z720 Tobacco use: Secondary | ICD-10-CM

## 2019-12-02 DIAGNOSIS — J302 Other seasonal allergic rhinitis: Secondary | ICD-10-CM

## 2019-12-02 NOTE — Patient Instructions (Addendum)
CVID Restart Hyzentra and continue once every 7 days. [facility] will call you to schedule an in home visit to restart your weekly infusions.  Recommend COVID vaccine when you are feeling better Recheck IgG level in one month at your follow up visit with Dr. Selena Batten  Asthma/COPD Get a chest xray to rule out infection Begin prednisone 10 mg tablets. Take 2 tablets twice a day for 3 days, then take 2 tablets once a day for 1 day, then take 1 tablet on the 5th day, then stopContinue Breztri 2 puffs twice a day with a spacer to prevent cough or wheeze Continue montelukast 10 mg once a day to prevent cough or wheeze Continue albuterol 2 puffs every 4 hours as needed for cough or wheeze Follow up with pulmonology  Allergic rhinitis Stop Xyzal for about 1 week Begin Mucinex [phone] twice a day to thin mucus secretions. Continue to drink fluids to this the mucus Continue avoidance measures directed toward grass, tree, ragweed, cat, and dog as listed below Continue Flonase 2 sprays in each nostril once a day as needed for a stuffy nose Consider saline nasal rinses as needed for nasal symptoms. Use this before any medicated nasal sprays for best result  Reflux Continue omeprazole 40 mg once a day Continue dietary and lifestyle modifications as listed below  Cough Follow the treatment plans as listed above. If your symptoms worsen or you develop a fever call the clinic or 911.   Tobacco Continue to cut down on smoking and try to quit.   Call the clinic if this treatment plan is not working well for you  Follow up in 1 week or sooner if needed.

## 2019-12-02 NOTE — Progress Notes (Addendum)
[address] [place]  [address] Dept: [phone]  FOLLOW UP NOTE  Patient ID: Kristi Allen, female    DOB: Oct 02, 1955  Age: 64 y.o. MRN: [id number] Date of Office Visit: 12/02/2019  Assessment  Chief Complaint: Asthma  HPI Kristi Allen is a 64 year old female who presents to the clinic for a sick visit.  She was last seen in this clinic on 10/29/2019 by Dr. 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  At today's visit she reports that about a week ago she began to experience symptoms including nasal congestion, rhinorrhea that is yellow and " chunky", sinus pressure, sinus pain above her eyes and below her eyes, wheeze at night and in the morning, and cough producing yellow chunky mucus.  She denies fever.  Last chest x-ray was she reports that she usually takes Hizentra 7 mg once a week subcutaneous injection, however, she reports she has not used this for about 4 weeks.  She has not been on an antibiotic since her last visit to this clinic.  She last received clindamycin and doxycycline for cellulitis about 1 month ago.  She has not received the Covid vaccine at this time.  For her asthma, she continues montelukast 10 mg once a day, per history 2 puffs twice a day with a spacer, and is using her albuterol every 2-3 hours for the last week.  She reports that she is smoking about 1-1/2 packs a day.  For allergic rhinitis she continues Xyzal 5 mg once a day, Flonase, azelastine, and saline nasal rinses.  She denies reflux including symptoms of heartburn and vomiting while continuing omeprazole twice a day. Her current medications are listed in the chart.   [facility] will contact the patient to schedule a nurse to come out to her house and help her with her infusion.   Drug Allergies:  Allergies  Allergen Reactions  . Doxycycline Rash  . Cyclobenzaprine   . Hydrocortisone     Breaks out in hives with  topical cream.   . Sulfamethoxazole-Trimethoprim Swelling  . Amoxicillin-Pot Clavulanate Nausea Only  . Codeine Rash    Other reaction(s): RASH    Physical Exam: BP (!) 140/60   Pulse 80   Temp 97.9 F (36.6 C) (Oral)   Resp 18   SpO2 99%    Physical Exam Vitals reviewed.  Constitutional:      Appearance: Normal appearance.  HENT:     Head: Normocephalic and atraumatic.     Right Ear: Tympanic membrane normal.     Left Ear: Tympanic membrane normal.     Nose:     Comments: Bilateral nares edematous with thick yellow drainage noted. Pharynx erythematous with no exudate. Ears normal. Eyes normal. Eyes:     Conjunctiva/sclera: Conjunctivae normal.  Cardiovascular:     Rate and Rhythm: Normal rate and regular rhythm.     Heart sounds: Normal heart sounds. No murmur heard.   Pulmonary:     Effort: Pulmonary effort is normal.     Breath sounds: Normal breath sounds.  Musculoskeletal:        General: Normal range of motion.     Cervical back: Normal range of motion and neck supple.  Skin:    General: Skin is warm and dry.  Neurological:     Mental Status: She is alert and oriented to person, place, and time.  Psychiatric:        Mood and Affect: Mood normal.  Behavior: Behavior normal.        Thought Content: Thought content normal.        Judgment: Judgment normal.    Diagnostics: FVC 1.93, FEV1 1.85.  Predicted FVC 2.73, predicted FEV1 2.09.  Spirometry indicates mild restriction.  This is consistent with previous spirometry readings.  Assessment and Plan: 1. Asthma-COPD overlap syndrome (HCC)   2. Seasonal and perennial allergic rhinitis   3. Cough   4. CVID (common variable immunodeficiency) (HCC)   5. Gastroesophageal reflux disease, unspecified whether esophagitis present   6. Tobacco use     Patient Instructions  CVID Restart Hyzentra and continue once every 7 days. [facility] will call you to schedule an in home visit to restart your  weekly infusions.  Recommend COVID vaccine when you are feeling better Recheck IgG level in one month at your follow up visit with Dr. Selena Batten  Asthma/COPD Get a chest xray to rule out infection Begin prednisone 10 mg tablets. Take 2 tablets twice a day for 3 days, then take 2 tablets once a day for 1 day, then take 1 tablet on the 5th day, then stopContinue Breztri 2 puffs twice a day with a spacer to prevent cough or wheeze Continue montelukast 10 mg once a day to prevent cough or wheeze Continue albuterol 2 puffs every 4 hours as needed for cough or wheeze Follow up with pulmonology  Allergic rhinitis Stop Xyzal for about 1 week Begin Mucinex [phone] mg twice a day to thin mucus secretions. Continue to drink fluids to this the mucus Continue avoidance measures directed toward grass, tree, ragweed, cat, and dog as listed below Continue Flonase 2 sprays in each nostril once a day as needed for a stuffy nose Consider saline nasal rinses as needed for nasal symptoms. Use this before any medicated nasal sprays for best result  Reflux Continue omeprazole 40 mg once a day Continue dietary and lifestyle modifications as listed below  Cough Follow the treatment plans as listed above. If your symptoms worsen or you develop a fever call the clinic or 911.   Tobacco Continue to cut down on smoking and try to quit.   Call the clinic if this treatment plan is not working well for you  Follow up in 1 week or sooner if needed.   Return in about 1 week (around 12/09/2019), or if symptoms worsen or fail to improve.    Thank you for the opportunity to care for this patient.  Please do not hesitate to contact me with questions.  Thermon Leyland, FNP [facility]  ________________________________________________  I have provided oversight concerning [facility]'s evaluation and treatment of this patient's health issues addressed during today's encounter.  I agree with the  assessment and therapeutic plan as outlined in the note.   Signed,   R Jorene Guest, MD

## 2019-12-04 ENCOUNTER — Telehealth: Payer: Self-pay | Admitting: Family Medicine

## 2019-12-04 NOTE — Telephone Encounter (Signed)
Patient notified of chest xray result indicating no cardiopulmonary disease or effusion. She reports that her breathing has improved and she is no longer having sinus pain or drainage. She continues the prednisone taper at this time. She will call with any worsening symptoms or fever. [facility] has contacted her about nurse support with her Hizentra infusion, however, she missed the call. She reports that she will call them back.

## 2019-12-19 ENCOUNTER — Other Ambulatory Visit: Payer: Self-pay | Admitting: Allergy

## 2020-01-02 ENCOUNTER — Other Ambulatory Visit: Payer: Self-pay

## 2020-01-02 ENCOUNTER — Encounter: Payer: Self-pay | Admitting: Allergy

## 2020-01-02 ENCOUNTER — Telehealth: Payer: Self-pay | Admitting: Allergy

## 2020-01-02 ENCOUNTER — Ambulatory Visit (INDEPENDENT_AMBULATORY_CARE_PROVIDER_SITE_OTHER): Payer: Medicaid Other | Admitting: Allergy

## 2020-01-02 VITALS — BP 100/66 | HR 74 | Temp 97.9°F | Resp 16

## 2020-01-02 DIAGNOSIS — K219 Gastro-esophageal reflux disease without esophagitis: Secondary | ICD-10-CM

## 2020-01-02 DIAGNOSIS — J3089 Other allergic rhinitis: Secondary | ICD-10-CM | POA: Diagnosis not present

## 2020-01-02 DIAGNOSIS — J449 Chronic obstructive pulmonary disease, unspecified: Secondary | ICD-10-CM | POA: Diagnosis not present

## 2020-01-02 DIAGNOSIS — J302 Other seasonal allergic rhinitis: Secondary | ICD-10-CM

## 2020-01-02 DIAGNOSIS — D839 Common variable immunodeficiency, unspecified: Secondary | ICD-10-CM

## 2020-01-02 DIAGNOSIS — Z720 Tobacco use: Secondary | ICD-10-CM

## 2020-01-02 DIAGNOSIS — J4489 Other specified chronic obstructive pulmonary disease: Secondary | ICD-10-CM

## 2020-01-02 MED ORDER — BUDESONIDE 0.5 MG/2ML IN SUSP
0.5000 mg | Freq: Two times a day (BID) | RESPIRATORY_TRACT | 2 refills | Status: DC
Start: 2020-01-02 — End: 2020-01-02

## 2020-01-02 MED ORDER — BUDESONIDE 0.5 MG/2ML IN SUSP: 0.5000 mg | Freq: Two times a day (BID) | RESPIRATORY_TRACT | 2 refills | Status: DC

## 2020-01-02 NOTE — Patient Instructions (Addendum)
CVID:  I recommend that we switch from Hizentra to [facility] which will be less frequent dosing.  Let us know when ready to switch.   Meanwhile continue with Hizentra weekly injections.   Keep track of infections.  And if you have another infection then let us know.   Recommend COVID-19 vaccination when you are feeling better.   Asthma/COPD: . Decrease smoking - smoke 1 less cigarette per week so hopefully by the next time I see you you will be down to 1 pack per day! Marland Kitchen START budesonide nebulizer twice a day for the next 1 week. . Daily controller medication(s): continue Breztri 2 puffs twice a day with spacer and rinse mouth afterwards.  . Prior to physical activity: May use albuterol rescue inhaler 2 puffs 5 to 15 minutes prior to strenuous physical activities. Marland Kitchen Rescue medications: May use albuterol rescue inhaler 2 puffs or nebulizer every 4 to 6 hours as needed for shortness of breath, chest tightness, coughing, and wheezing. Monitor frequency of use.   During upper respiratory infections/asthma flares: start budesonide nebulizer for 1-2 weeks until your breathing symptoms return to baseline.  . Asthma control goals:  o Full participation in all desired activities (may need albuterol before activity) o Albuterol use two times or less a week on average (not counting use with activity) o Cough interfering with sleep two times or less a month o Oral steroids no more than once a year o No hospitalizations  Environmental allergies:  2021 bloodwork positive to cat, dog, grass, trees, ragweed.  May use over the counter antihistamines such as Xyzal (levocetirizine) 5mg  daily.  Continue with Singulair (montelukast) 10mg  daily at night.  Use Fluticasone 1 spray per nostril twice a day for nasal congestion.  Use azelastine 1-2 sprays per nostril twice a day for drainage.  Nasal saline spray (i.e., Simply Saline) or nasal saline lavage (i.e., NeilMed) is recommended as needed and prior  to medicated nasal sprays.  Continue environmental control measures.  Reflux:  Continue omeprazole 40mg  daily.  Follow up in 2 months or sooner if needed.    After September 2021, I will not be in the [facility] office on a regular basis.   You can continue your care and follow up with Dr. in [facility].  OR you can follow up with me in our [place] ([address]) or [place] [phone]) location.  Sincerely,  [place], DO  [facility] office: [phone] [facility] office: [phone] [place] office: [phone]   Recommend getting the COVID-19 vaccine.  Here's more information: [phone]  Pet Allergen Avoidance: . Contrary to popular opinion, there are no "hypoallergenic" breeds of dogs or cats. That is because people are not allergic to an animal's hair, but to an allergen found in the animal's saliva, dander (dead skin flakes) or urine. Pet allergy symptoms typically occur within minutes. For some people, symptoms can build up and become most severe 8 to 12 hours after contact with the animal. People with severe allergies can experience reactions in public places if dander has been transported on the pet owners' clothing. [place] city Keeping an animal outdoors is only a partial solution, since homes with pets in the yard still have higher concentrations of animal allergens. . Before getting a pet, ask your allergist to determine if you are allergic to animals. If your pet is already considered part of your family, try to minimize contact and keep the  pet out of the bedroom and other rooms where you spend a great deal of time. . As with dust mites, vacuum carpets often or replace carpet with a hardwood floor, tile or linoleum. . High-efficiency particulate air (HEPA) cleaners can reduce allergen levels over time. . While dander and saliva are the source of cat and dog allergens, urine is  the source of allergens from rabbits, hamsters, mice and [place] pigs; so ask a non-allergic family member to clean the animal's cage. . If you have a pet allergy, talk to your allergist about the potential for allergy immunotherapy (allergy shots). This strategy can often provide long-term relief. Reducing Pollen Exposure . Pollen seasons: trees (spring), grass (summer) and ragweed/weeds (fall). Marland Kitchen Keep windows closed in your home and car to lower pollen exposure.  Lilian Kapur air conditioning in the bedroom and throughout the house if possible.  . Avoid going out in dry windy days - especially early morning. . Pollen counts are highest between 5 - 10 AM and on dry, hot and windy days.  . Save outside activities for late afternoon or after a heavy rain, when pollen levels are lower.  . Avoid mowing of grass if you have grass pollen allergy. Marland Kitchen Be aware that pollen can also be transported indoors on people and pets.  . Dry your clothes in an automatic dryer rather than hanging them outside where they might collect pollen.  . Rinse hair and eyes before bedtime.

## 2020-01-02 NOTE — Progress Notes (Signed)
Follow Up Note  RE: Kristi Allen MRN: [id number] DOB: Sep 22, 1955 Date of Office Visit: 01/02/2020  Referring provider: List, Nash Shearer, FNP Primary care provider: List, Nash Shearer, FNP  Chief Complaint: Asthma  History of Present Illness: I had the pleasure of seeing Kristi Allen for a follow up visit at the [facility] on 01/02/2020. She is a 64 y.o. female, who is being followed for CVID, asthma-COPD overlap syndrome, allergic rhinitis, reflux. Her previous allergy office visit was on 12/02/2019 with Thermon Leyland, FNP. Today is a regular follow up visit.  Reflux Taking omeprazole with good benefit.   CVID (common variable immunodeficiency) Patient did not get the Pfizer vaccine yet. She is still doing Hizentra weekly but hesitant to switch to [facility]. She is still having issues with sometimes not able to do her injections due to her coughing spells. Apparently a nurse came out to help once but then they did not come out again.   No more infections or antibiotics since the last visit.   Asthma-COPD overlap syndrome  Patient finished prednisone which helped but now having some coughing for about the past 1 week. She thinks it's because the ragweed pollen is out now.  Currently on Breztri 2 puffs twice a day and using albuterol every 3 hours as needed right now with some benefit. Used albuterol nebulizer at 12:30PM today. Still smoking 1.5 ppd.   Seasonal and perennial allergic rhinitis Still taking Xyzal, Singulair, Flonase, azelastine with some benefit.   Assessment and Plan: Kristi Allen is a 64 y.o. female with: CVID (common variable immunodeficiency) (HCC) Past history - Patient was diagnosed with CVID prior to coming to our office and has been on weekly Hizentra injections with good benefit. She mentions skipping few doses at times due to inability to self-administer due to her coughing fits. Patient had bronchiectasis on CT chest in past. Still smoking 1.5 pack per  day.  Interim history - Receiving Hizentra 7gm every 7 days. Last IgG level 792. Patient is hesitant to switching to [facility] but has been missing Hizentra doses due to her unable to administer it during coughing fits.  Discussed with patient at length that it would be ideal to switch from Hizentra to [facility] as it is administered less frequently and hopefully this will improve compliance with the infusions.   She will let us know when ready to switch.  Meanwhile continue with Hizentra weekly injections.   Keep track of infections.  And if you have another infection then let us know.   Recommend COVID-19 vaccination.  Will need to monitor IgG levels closely when switching to [facility] to see if any adjustment in the dosing needs to be made.   Asthma-COPD overlap syndrome (HCC) Past history - Patient used to follow with pulmonology as well. Had bronchiectasis on CT chest and failed to follow up. Still smoking 1.5 pack per day which is less than her previous of 3 packs per day. Using Symbicort 160 2 puffs every few hours and albuterol on a daily basis due to coughing, wheezing and shortness of breath. Normal alpha-1 level. Eosinophils 100, IgE 458. Interim history - better after prednisone course but coughing some. Still smoking 1.5 pack per day.  . Strongly encouraged smoking cessation.  Marland Kitchen START budesonide 0.5mg  nebulizer twice a day for the next 1 week.  . Daily controller medication(s): continue Breztri 2 puffs twice a day with spacer and rinse mouth afterwards.  . Prior to physical activity: May use albuterol rescue inhaler  2 puffs 5 to 15 minutes prior to strenuous physical activities. Marland Kitchen Rescue medications: May use albuterol rescue inhaler 2 puffs or nebulizer every 4 to 6 hours as needed for shortness of breath, chest tightness, coughing, and wheezing. Monitor frequency of use.   During upper respiratory infections/asthma flares: start budesonide nebulizer for 1-2 weeks until your  breathing symptoms return to baseline.   Will get spirometry at next visit instead of today due to COVID-19 pandemic and trying to minimize any type of aerosolizing procedures at this time in the office.   May consider adding on omalizumab in the future for better control.  Seasonal and perennial allergic rhinitis Past history - 2021 bloodwork positive to cat, dog, grass, trees, ragweed.  Interim history - rhinitis flare with the ragweed pollen.  May use over the counter antihistamines such as Xyzal (levocetirizine) 5mg  daily.  Continue with Singulair (montelukast) 10mg  daily at night.  Use Fluticasone 1 spray per nostril twice a day for nasal congestion.  Use azelastine 1-2 sprays per nostril twice a day for drainage.  Nasal saline spray (i.e., Simply Saline) or nasal saline lavage (i.e., NeilMed) is recommended as needed and prior to medicated nasal sprays.  Continue environmental control measures.  Gastroesophageal reflux disease  Continue omeprazole.   Return in about 2 months (around 03/03/2020).   Patient can't drive long distances and requesting to have follow ups in the [facility] office.   Diagnostics: None.  Medication List:  Current Outpatient Medications  Medication Sig Dispense Refill  . albuterol (PROVENTIL) (2.5 MG/3ML) 0.083% nebulizer solution Take 2.5 mg by nebulization every 6 (six) hours as needed for wheezing or shortness of breath.    03/05/2020 albuterol (VENTOLIN HFA) 108 (90 Base) MCG/ACT inhaler Inhale 2-4 puffs into the lungs every 4 (four) hours as needed for wheezing or shortness of breath (coughing fits). 18 g 2  . amLODipine (NORVASC) 10 MG tablet Take 10 mg by mouth daily.    [facility] atorvastatin (LIPITOR) 10 MG tablet Take 10 mg by mouth daily.    . Azelastine HCl 0.15 % SOLN Place 1-2 sprays into the nose in the morning and at bedtime. For drainage 30 mL 2  . BREZTRI AEROSPHERE 160-9-4.8 MCG/ACT AERO INHALE 2 PUFFS INTO THE LUNGS IN THE MORNING AND AT  BEDTIME. WITH SPACER AND RINSE MOUTH AFTERWARDS. 10.7 g 2  . DULoxetine (CYMBALTA) 60 MG capsule 2 capsules daily.    . fluticasone (FLONASE) 50 MCG/ACT nasal spray Place 1 spray into both nostrils in the morning and at bedtime. For drainage 16 g 2  . hydrochlorothiazide (HYDRODIURIL) 25 MG tablet Take 25 mg by mouth daily.    . Immune Globulin, Human, (HIZENTRA [place]) Inject 7 g into the skin once a week. Hizentra 20%  7 grams (35 ml) one time every week.    Marland Kitchen ipratropium-albuterol (DUONEB) 0.5-2.5 (3) MG/3ML SOLN Take 3 mLs by nebulization every 6 (six) hours as needed. 360 mL 5  . levocetirizine (XYZAL) 5 MG tablet Take 1 tablet (5 mg total) by mouth daily as needed for allergies. 30 tablet 5  . losartan (COZAAR) 50 MG tablet Take 50 mg by mouth daily.    . montelukast (SINGULAIR) 10 MG tablet Take 1 tablet (10 mg total) by mouth at bedtime. 30 tablet 2  . Olopatadine HCl 0.2 % SOLN 1 drop Two (2) times a day.    Marland Kitchen omeprazole (PRILOSEC) 40 MG capsule Take 40 mg by mouth daily.    Marland Kitchen Respiratory Therapy  Supplies (FLUTTER) DEVI 1 Device by Does not apply route as directed. 1 each 0  . simvastatin (ZOCOR) 80 MG tablet 1 tablet daily.    Marland Kitchen tiotropium (SPIRIVA) 18 MCG inhalation capsule Frequency:as needed   Dosage:18   MCG  Instructions:Spiriva HandiHaler ( CAPS, Inhalation as needed)  Note:    . budesonide (PULMICORT) 0.5 MG/2ML nebulizer solution Take 2 mLs (0.5 mg total) by nebulization in the morning and at bedtime. 60 mL 2  . loratadine (CLARITIN) 10 MG tablet Take 1 tablet (10 mg total) by mouth daily. 30 tablet 2   No current facility-administered medications for this visit.   Allergies: Allergies  Allergen Reactions  . Doxycycline Rash  . Cyclobenzaprine   . Hydrocortisone     Breaks out in hives with topical cream.   . Sulfamethoxazole-Trimethoprim Swelling  . Amoxicillin-Pot Clavulanate Nausea Only  . Codeine Rash    Other reaction(s): RASH   I reviewed her past medical  history, social history, family history, and environmental history and no significant changes have been reported from her previous visit.  Review of Systems  Constitutional: Negative for appetite change, chills, fever and unexpected weight change.  HENT: Positive for congestion, postnasal drip and rhinorrhea.   Eyes: Negative for itching.  Respiratory: Positive for cough, chest tightness and shortness of breath. Negative for wheezing.   Gastrointestinal: Negative for abdominal pain.  Skin: Negative for rash.  Allergic/Immunologic: Positive for environmental allergies.  Neurological: Negative for headaches.   Objective: BP 100/66   Pulse 74   Temp 97.9 F (36.6 C) (Oral)   Resp 16   SpO2 99%  There is no height or weight on file to calculate BMI. Physical Exam Vitals and nursing note reviewed.  Constitutional:      Appearance: Normal appearance. She is well-developed.  HENT:     Head: Normocephalic and atraumatic.     Right Ear: Tympanic membrane and external ear normal.     Left Ear: Tympanic membrane and external ear normal.     Nose: Congestion present. No mucosal edema or rhinorrhea.     Mouth/Throat:     Mouth: Mucous membranes are moist.     Pharynx: Oropharynx is clear.  Eyes:     Conjunctiva/sclera: Conjunctivae normal.  Cardiovascular:     Rate and Rhythm: Normal rate and regular rhythm.     Heart sounds: Normal heart sounds. No murmur heard.  No friction rub. No gallop.   Pulmonary:     Effort: Pulmonary effort is normal.     Breath sounds: Normal breath sounds. No wheezing, rhonchi or rales.  Musculoskeletal:     Cervical back: Neck supple.  Skin:    General: Skin is warm.     Findings: No rash.  Neurological:     Mental Status: She is alert and oriented to person, place, and time.  Psychiatric:        Behavior: Behavior normal.    Previous notes and tests were reviewed. The plan was reviewed with the patient/family, and all questions/concerned were  addressed.  It was my pleasure to see Kristi Allen today and participate in her care. Please feel free to contact me with any questions or concerns.  Sincerely,  Wyline Mood, DO Allergy & Immunology  [facility] office: [phone] [facility] office: [phone] [place] office: [phone]

## 2020-01-02 NOTE — Assessment & Plan Note (Signed)
Continue omeprazole 

## 2020-01-02 NOTE — Assessment & Plan Note (Signed)
Past history - 2021 bloodwork positive to cat, dog, grass, trees, ragweed.  Interim history - rhinitis flare with the ragweed pollen.  May use over the counter antihistamines such as Xyzal (levocetirizine) 5mg  daily.  Continue with Singulair (montelukast) 10mg  daily at night.  Use Fluticasone 1 spray per nostril twice a day for nasal congestion.  Use azelastine 1-2 sprays per nostril twice a day for drainage.  Nasal saline spray (i.e., Simply Saline) or nasal saline lavage (i.e., NeilMed) is recommended as needed and prior to medicated nasal sprays.  Continue environmental control measures.

## 2020-01-02 NOTE — Telephone Encounter (Signed)
Pt states pharmacy needs PA for  Pulmicort solution

## 2020-01-02 NOTE — Assessment & Plan Note (Signed)
Past history - Patient was diagnosed with CVID prior to coming to our office and has been on weekly Hizentra injections with good benefit. She mentions skipping few doses at times due to inability to self-administer due to her coughing fits. Patient had bronchiectasis on CT chest in past. Still smoking 1.5 pack per day.  Interim history - Receiving Hizentra 7gm every 7 days. Last IgG level 792. Patient is hesitant to switching to [facility] but has been missing Hizentra doses due to her unable to administer it during coughing fits.  Discussed with patient at length that it would be ideal to switch from Hizentra to [facility] as it is administered less frequently and hopefully this will improve compliance with the infusions.   She will let us know when ready to switch.  Meanwhile continue with Hizentra weekly injections.   Keep track of infections.  And if you have another infection then let us know.   Recommend COVID-19 vaccination.  Will need to monitor IgG levels closely when switching to [facility] to see if any adjustment in the dosing needs to be made.

## 2020-01-02 NOTE — Assessment & Plan Note (Addendum)
Past history - Patient used to follow with pulmonology as well. Had bronchiectasis on CT chest and failed to follow up. Still smoking 1.5 pack per day which is less than her previous of 3 packs per day. Using Symbicort 160 2 puffs every few hours and albuterol on a daily basis due to coughing, wheezing and shortness of breath. Normal alpha-1 level. Eosinophils 100, IgE 458. Interim history - better after prednisone course but coughing some. Still smoking 1.5 pack per day.  . Strongly encouraged smoking cessation.  Marland Kitchen START budesonide 0.5mg  nebulizer twice a day for the next 1 week.  . Daily controller medication(s): continue Breztri 2 puffs twice a day with spacer and rinse mouth afterwards.  . Prior to physical activity: May use albuterol rescue inhaler 2 puffs 5 to 15 minutes prior to strenuous physical activities. Marland Kitchen Rescue medications: May use albuterol rescue inhaler 2 puffs or nebulizer every 4 to 6 hours as needed for shortness of breath, chest tightness, coughing, and wheezing. Monitor frequency of use.   During upper respiratory infections/asthma flares: start budesonide nebulizer for 1-2 weeks until your breathing symptoms return to baseline.   Will get spirometry at next visit instead of today due to COVID-19 pandemic and trying to minimize any type of aerosolizing procedures at this time in the office.   May consider adding on omalizumab in the future for better control.

## 2020-01-05 NOTE — Telephone Encounter (Signed)
Pa submitted thru cover my meds 

## 2020-01-06 NOTE — Telephone Encounter (Signed)
received fax from [employer] that stated "we are unable to further process this requet. This request has been closed because you told us on 01/05/20 (according to claim history) that the members medication has been switched to a recommended formulary alternative, Pulmicort inhalation suspension."

## 2020-02-20 ENCOUNTER — Other Ambulatory Visit: Payer: Self-pay | Admitting: Allergy

## 2020-03-06 ENCOUNTER — Other Ambulatory Visit: Payer: Self-pay | Admitting: Allergy

## 2020-03-11 ENCOUNTER — Telehealth: Payer: Self-pay | Admitting: *Deleted

## 2020-03-11 MED ORDER — BUDESONIDE-FORMOTEROL FUMARATE 160-4.5 MCG/ACT IN AERO
2.0000 | INHALATION_SPRAY | Freq: Two times a day (BID) | RESPIRATORY_TRACT | 5 refills | Status: DC
Start: 2020-03-11 — End: 2020-03-16

## 2020-03-11 MED ORDER — SPIRIVA RESPIMAT 2.5 MCG/ACT IN AERS: 2.0000 | INHALATION_SPRAY | Freq: Every day | RESPIRATORY_TRACT | 5 refills | Status: DC

## 2020-03-11 NOTE — Addendum Note (Signed)
Addended by: Ellamae Sia on: 03/11/2020 06:10 PM   Modules accepted: Orders

## 2020-03-11 NOTE — Telephone Encounter (Signed)
PA has been submitted through CoverMyMeds for [facility] and is currently pending approval or denial. Patient's Rx information is [id number], [id number], [id number], GRP- [id number].

## 2020-03-11 NOTE — Telephone Encounter (Signed)
Please call patient and let her know of denial. Will put her back on Symbicort 2 puffs daily with Spiriva 2.79mcg (COPD dose) 2 puffs daily.

## 2020-03-11 NOTE — Telephone Encounter (Signed)
PA was denied for [facility]. Please advise change in inhaler and I will check for coverage.

## 2020-03-15 NOTE — Telephone Encounter (Signed)
Pt informed of this, and will stick to the symbicort

## 2020-03-16 ENCOUNTER — Other Ambulatory Visit: Payer: Self-pay

## 2020-03-16 ENCOUNTER — Telehealth: Payer: Self-pay | Admitting: Allergy

## 2020-03-16 MED ORDER — BUDESONIDE-FORMOTEROL FUMARATE 160-4.5 MCG/ACT IN AERO: 2.0000 | INHALATION_SPRAY | Freq: Two times a day (BID) | RESPIRATORY_TRACT | 5 refills | Status: DC

## 2020-03-16 NOTE — Telephone Encounter (Signed)
Refill sent into [facility]

## 2020-03-16 NOTE — Telephone Encounter (Signed)
Pt request refill for symbicort be sent to [facility]. Ins won't cover [employer]

## 2020-03-17 ENCOUNTER — Ambulatory Visit: Payer: Medicaid Other | Admitting: Allergy and Immunology

## 2020-03-24 ENCOUNTER — Ambulatory Visit (INDEPENDENT_AMBULATORY_CARE_PROVIDER_SITE_OTHER): Payer: Medicaid Other | Admitting: Family Medicine

## 2020-03-24 ENCOUNTER — Other Ambulatory Visit: Payer: Self-pay

## 2020-03-24 ENCOUNTER — Encounter: Payer: Self-pay | Admitting: Family Medicine

## 2020-03-24 VITALS — BP 154/64 | HR 80 | Resp 16 | Ht 61.58 in | Wt 146.4 lb

## 2020-03-24 DIAGNOSIS — R059 Cough, unspecified: Secondary | ICD-10-CM

## 2020-03-24 DIAGNOSIS — Z720 Tobacco use: Secondary | ICD-10-CM

## 2020-03-24 DIAGNOSIS — J3089 Other allergic rhinitis: Secondary | ICD-10-CM

## 2020-03-24 DIAGNOSIS — D839 Common variable immunodeficiency, unspecified: Secondary | ICD-10-CM | POA: Diagnosis not present

## 2020-03-24 DIAGNOSIS — J0100 Acute maxillary sinusitis, unspecified: Secondary | ICD-10-CM

## 2020-03-24 DIAGNOSIS — J302 Other seasonal allergic rhinitis: Secondary | ICD-10-CM

## 2020-03-24 DIAGNOSIS — K219 Gastro-esophageal reflux disease without esophagitis: Secondary | ICD-10-CM

## 2020-03-24 DIAGNOSIS — J449 Chronic obstructive pulmonary disease, unspecified: Secondary | ICD-10-CM

## 2020-03-24 MED ORDER — AZITHROMYCIN 250 MG PO TABS: ORAL_TABLET | ORAL | 0 refills | Status: DC

## 2020-03-24 NOTE — Patient Instructions (Addendum)
Asthma /COPD overlap Stop Symbicort at this time as Markus Daft has been approved by your insurance.  Begin Breztri -2 puffs twice a day with a spacer to prevent cough or wheeze. This will take the place of Symbicort and Spiriva Begin prednisone 10 mg tablets. Take 2 tablets twice a day for 3 days, then take 2 tablets once a day for 1 day, then take 1 tablet on the 5th day, then stop Continue montelukast 10 mg once a day to prevent cough or wheeze Continue albuterol 2 puffs every 4 hours as needed for cough or wheeze OR Instead use albuterol 0.083% solution via nebulizer one unit vial every 4 hours as needed for cough or wheeze For asthma flare, begin budesonide 0.5 mg twice a day via nebulizer for 1 to 2 weeks or until cough and wheeze free Consider Xolair injections for better control of asthma. Written information provided at today's visit Stop smoking  Acute sinusitis Begin azithromycin 250 mg tablets. Take 2 tablets on the first day, then take 1 tablet once a day for the next 4 days, then stop Begin prednisone as listed above Continue nasal saline rinses several times a day and use Flonase 2 sprays in each nostril once a day For thick post nasal drainage, begin Mucinex [phone] mg twice a day and increase fluid intake to thin out mucus  Allergic rhinitis Continue avoidance measures directed toward grass pollen, tree pollen, ragweed pollen, cat, and dog Stop Xyzal 5 mg for 1 week. Then, restart Xyzal 5 mg once a day as needed for runny nose  Beginazelastine 2 sprays in each nostril twice a day as needed for runny nose Continue Flonase 2 sprays in each nostril once a day as needed for stuffy nose Consider saline nasal rinses as needed for nasal symptoms. Use this before any medicated nasal sprays for best result  CVID Restart Hizentra infusions once a week. Consider switching to HyQvia infusions as you need to administer these less requently Keep track of infections and antibiotics  required Get the influenza vaccine when you are feeling well in about 6 days from today Consider getting COVID vaccines. We offer a COVID vaccine clinic in the [facility] office.   Reflux Continue dietary and lifestyle modifications as listed below Continue omeprazole 40 mg once a day  Tobacco Stop smoking or try to cut down Written information from up-to-date provided at today's visit  Your blood pressure was elevated at today's visit. Follow up with your primary care provider for evaluation and treatment of your blood pressure.  Call the clinic if this treatment plan is not working well for you  Follow up in 2 weeks or sooner if needed.

## 2020-03-24 NOTE — Progress Notes (Addendum)
[address] [place] [address] Dept: [phone]  FOLLOW UP NOTE  Patient ID: Kristi Allen, female    DOB: 1956/04/21  Age: 64 y.o. MRN: [id number] Date of Office Visit: 03/24/2020  Assessment  Chief Complaint: Asthma (Coughing and Congestion for several weeks)  HPI Kristi Allen is a 64 year old female who presents to the clinic for a follow up visit. She was last seen in this clinic on 01/02/2020 by Dr. 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 At today's visit, she reports symptoms that began about 4 or 5 weeks ago including thick, yellow, chunky nasal drainage, nasal congestion, cough producing thick yellow drainage, facial pain underneath both eyes, and intermittent shortness of breath with wheeze.  She reports that about 3 weeks ago she went to a provider and received a short prednisone taper which helped to relieve some of the symptoms, however, they returned shortly after the prednisone taper was gone.  She denies fever, sweats, chills, and sick contacts.  Asthma is reported as moderately well controlled with some shortness of breath which is worse with activity, wheeze occurring in the morning and evening hours, and cough with thick yellow mucus production.  She continues Symbicort 2 puffs twice a day and is using albuterol every 3-4 hours for the last 3 weeks.  She did receive a Breztri sample from this clinic, however, this was denied by her insurance and she was placed back on Symbicort 160+ Spiriva 2.5 mcg.  She reports that she is currently using Symbicort 160 and is not using Spiriva.  She reports in the meantime that her insurance has approved Breztri.  Allergic rhinitis is reported as not well controlled with symptoms including nasal congestion, thick yellow chunky rhinorrhea, and thick postnasal drainage.  She continues Xyzal 5 mg once a day, Flonase daily, and nasal saline spray daily.  Reflux is  reported as well controlled with omeprazole 40 mg twice a day.  She continues Hizentra infusions, however, reports that she is several weeks behind with these due to continued cough and anxiety.  She continues to smoke 1-1/2 packs of cigarettes a day.  Her current medications are listed in the chart.   Drug Allergies:  Allergies  Allergen Reactions  . Doxycycline Rash  . Cyclobenzaprine   . Hydrocortisone     Breaks out in hives with topical cream.   . Sulfamethoxazole-Trimethoprim Swelling  . Amoxicillin-Pot Clavulanate Nausea Only  . Codeine Rash    Other reaction(s): RASH    Physical Exam: BP (!) 154/64 (BP Location: Left Arm, Patient Position: Sitting, Cuff Size: Normal)   Pulse 80   Resp 16   Ht 5' 1.58" (1.564 m)   Wt 146 lb 6.4 oz (66.4 kg)   BMI 27.15 kg/m    Physical Exam Vitals reviewed.  Constitutional:      Appearance: Normal appearance.  HENT:     Head: Normocephalic and atraumatic.     Right Ear: Tympanic membrane normal.     Left Ear: Tympanic membrane normal.     Nose:     Comments: Bilateral nares normal.  Pharynx is slightly erythematous with no exudate.  Ears normal.  Eyes normal. Eyes:     Conjunctiva/sclera: Conjunctivae normal.  Cardiovascular:     Rate and Rhythm: Normal rate and regular rhythm.     Heart sounds: Normal heart sounds. No murmur heard.   Pulmonary:     Effort: Pulmonary effort is normal.  Breath sounds: Normal breath sounds.     Comments: Lungs with scattered rhonchi which cleared with cough Musculoskeletal:        General: Normal range of motion.     Cervical back: Normal range of motion and neck supple.  Skin:    General: Skin is warm and dry.  Neurological:     Mental Status: She is alert and oriented to person, place, and time.  Psychiatric:        Mood and Affect: Mood normal.        Behavior: Behavior normal.        Thought Content: Thought content normal.        Judgment: Judgment normal.     Diagnostics: FVC  1.78, FEV1 1.56.  Predicted FVC 2.96, predicted FEV1 2.27.  Spirometry indicates moderate restriction.  Postbronchodilator spirometry FVC 2.13, FEV1 1.66.  Postbronchodilator spirometry indicates mild restriction with 20% improvement in FVC and 6% improvement in FEV1.  Assessment and Plan: 1. Asthma-COPD overlap syndrome (HCC)   2. Acute maxillary sinusitis, recurrence not specified   3. CVID (common variable immunodeficiency) (HCC)   4. Seasonal and perennial allergic rhinitis   5. Cough   6. Tobacco use   7. Gastroesophageal reflux disease, unspecified whether esophagitis present     Meds ordered this encounter  Medications  . azithromycin (ZITHROMAX) 250 MG tablet    Sig: Take 2 tablets on the first day, then take 1 tablet once a day for the next 4 days, then stop    Dispense:  6 each    Refill:  0    Patient Instructions  Asthma /COPD overlap Stop Symbicort at this time as Markus Daft has been approved by your insurance.  Begin Breztri -2 puffs twice a day with a spacer to prevent cough or wheeze. This will take the place of Symbicort and Spiriva Begin prednisone 10 mg tablets. Take 2 tablets twice a day for 3 days, then take 2 tablets once a day for 1 day, then take 1 tablet on the 5th day, then stop Continue montelukast 10 mg once a day to prevent cough or wheeze Continue albuterol 2 puffs every 4 hours as needed for cough or wheeze OR Instead use albuterol 0.083% solution via nebulizer one unit vial every 4 hours as needed for cough or wheeze For asthma flare, begin budesonide 0.5 mg twice a day via nebulizer for 1 to 2 weeks or until cough and wheeze free Consider Xolair injections for better control of asthma. Written information provided at today's visit Stop smoking  Acute sinusitis Begin azithromycin 250 mg tablets. Take 2 tablets on the first day, then take 1 tablet once a day for the next 4 days, then stop Begin prednisone as listed above Continue nasal saline rinses  several times a day and use Flonase 2 sprays in each nostril once a day For thick post nasal drainage, begin Mucinex [phone] mg twice a day and increase fluid intake to thin out mucus  Allergic rhinitis Continue avoidance measures directed toward grass pollen, tree pollen, ragweed pollen, cat, and dog Stop Xyzal 5 mg for 1 week. Then, restart Xyzal 5 mg once a day as needed for runny nose  Beginazelastine 2 sprays in each nostril twice a day as needed for runny nose Continue Flonase 2 sprays in each nostril once a day as needed for stuffy nose Consider saline nasal rinses as needed for nasal symptoms. Use this before any medicated nasal sprays for best result  CVID Restart  Hizentra infusions once a week. Consider switching to HyQvia infusions as you need to administer these less requently Keep track of infections and antibiotics required Get the influenza vaccine when you are feeling well in about 6 days from today Consider getting COVID vaccines. We offer a COVID vaccine clinic in the [facility] office.   Reflux Continue dietary and lifestyle modifications as listed below Continue omeprazole 40 mg once a day  Tobacco Stop smoking or try to cut down Written information from up-to-date provided at today's visit  Your blood pressure was elevated at today's visit. Follow up with your primary care provider for evaluation and treatment of your blood pressure.  Call the clinic if this treatment plan is not working well for you  Follow up in 2 weeks or sooner if needed.    Return in about 2 weeks (around 04/07/2020), or if symptoms worsen or fail to improve.    Thank you for the opportunity to care for this patient.  Please do not hesitate to contact me with questions.  Thermon Leyland, FNP [facility]  ________________________________________________  I have provided oversight concerning [facility]'s evaluation and treatment of this patient's health issues  addressed during today's encounter.  I agree with the assessment and therapeutic plan as outlined in the note.   Signed,   R Jorene Guest, MD

## 2020-03-25 NOTE — Addendum Note (Signed)
Addended by: Maryjean Morn D on: 03/25/2020 12:07 PM   Modules accepted: Orders

## 2020-03-30 ENCOUNTER — Other Ambulatory Visit: Payer: Self-pay | Admitting: Allergy

## 2020-04-07 ENCOUNTER — Ambulatory Visit: Payer: Medicaid Other | Admitting: Family Medicine

## 2020-04-13 NOTE — Progress Notes (Addendum)
[address] [place] [address] Dept: [phone]  FOLLOW UP NOTE  Patient ID: Kristi Allen, female    DOB: [birth year]  Age: 64 y.o. MRN: [id number] Date of Office Visit: 04/14/2020  Assessment  Chief Complaint: Asthma  HPI [name] is a 64 year old female who presents to the clinic for a follow up visit. She was last seen in this clinic on 03/24/2020 for evaluation of asthma COPD overlap syndrome, CVID, asthma, reflux, and acute sinusitis.  In the interim, she has finished taking azithromycin and prednisone for acute sinusitis.  At today's visit she reports her asthma has been much more well controlled with only occasional shortness of breath, occasional wheeze which occurs mostly in the morning, and cough producing clear to light yellow mucus which is beginning to resolve.  She continues montelukast 10 mg once a Allen, Breztri 2 puffs twice a Allen, and albuterol twice a Allen on a regular schedule.  She continues budesonide 0.5 mg twice a Allen via nebulizer at this time.  Allergic rhinitis is reported as moderately well controlled with symptoms including nasal congestion and postnasal drainage which MR significantly improved since her last visit to this clinic.  She continues Xyzal, azelastine, and Flonase as needed.  She does report using saline nasal rinses occasionally.  Reflux is reported as moderately well controlled with omeprazole once a Allen.  She reports that she is willing to restart Hizentra infusions this week.  She has not had Hizentra infusion in several weeks.  She continues to smoke cigarettes daily and is not interested in quitting or cutting down at this time.  She did receive an influenza vaccine and pneumococcal vaccine within the last 2 weeks.  She is considering getting a Covid vaccine at our clinic within the next few weeks.  Her current medications are listed in the chart.   Drug Allergies:  Allergies  Allergen Reactions  . Doxycycline Rash  .  Cyclobenzaprine   . Hydrocortisone     Breaks out in hives with topical cream.   . Sulfamethoxazole-Trimethoprim Swelling  . Amoxicillin-Pot Clavulanate Nausea Only  . Codeine Rash    Other reaction(s): RASH    Physical Exam: BP 134/62   Pulse 74   Temp 99 F (37.2 C) (Tympanic)   Resp 16   SpO2 98%    Physical Exam Vitals reviewed.  Constitutional:      Appearance: Normal appearance.  HENT:     Head: Normocephalic and atraumatic.     Right Ear: Tympanic membrane normal.     Left Ear: Tympanic membrane normal.     Nose:     Comments: Bilateral nares slightly erythematous with clear nasal drainage noted.  Pharynx normal.  Ears normal.  Eyes normal.    Mouth/Throat:     Pharynx: Oropharynx is clear.  Eyes:     Conjunctiva/sclera: Conjunctivae normal.  Cardiovascular:     Rate and Rhythm: Normal rate and regular rhythm.     Heart sounds: Normal heart sounds. No murmur heard.   Pulmonary:     Effort: Pulmonary effort is normal.     Breath sounds: Normal breath sounds.     Comments: Lungs clear to auscultation Musculoskeletal:        General: Normal range of motion.     Cervical back: Normal range of motion and neck supple.  Skin:    General: Skin is warm and dry.  Neurological:     Mental Status: She is alert and oriented to person,  place, and time.  Psychiatric:        Mood and Affect: Mood normal.        Behavior: Behavior normal.        Thought Content: Thought content normal.        Judgment: Judgment normal.     Diagnostics: FVC 2.13, FEV1 1.76.  Predicted FVC 2.96, predicted FEV1 2.27.  Spirometry indicates mild restriction.  Is consistent with previous spirometry readings.  Assessment and Plan: 1. Asthma-COPD overlap syndrome (HCC)   2. Seasonal and perennial allergic rhinitis   3. Gastroesophageal reflux disease, unspecified whether esophagitis present   4. CVID (common variable immunodeficiency) (HCC)   5. Tobacco use   6. Vaccine counseling      Meds ordered this encounter  Medications  . Budeson-Glycopyrrol-Formoterol (BREZTRI AEROSPHERE) 160-9-4.8 MCG/ACT AERO    Sig: Inhale 2 puffs into the lungs 2 (two) times daily.    Dispense:  10.7 g    Refill:  2    This prescription was filled on 12/03/2019. Any refills authorized will be placed on file.    Patient Instructions  Asthma /COPD overlap Continue Breztri -2 puffs twice a Allen with a spacer to prevent cough or wheeze. This will take the place of Symbicort and Spiriva Continue montelukast 10 mg once a Allen to prevent cough or wheeze Continue albuterol 2 puffs every 4 hours as needed for cough or wheeze OR Instead use albuterol 0.083% solution via nebulizer one unit vial every 4 hours as needed for cough or wheeze For asthma flare, begin budesonide 0.5 mg twice a Allen via nebulizer for 1 to 2 weeks or until cough and wheeze free.   Consider Xolair injections for better control of asthma. Written information provided at today's visit Stop smoking  Allergic rhinitis Continue avoidance measures directed toward grass pollen, tree pollen, ragweed pollen, cat, and dog Continue Xyzal 5 mg once a Allen as needed for runny nose  Begin azelastine 2 sprays in each nostril twice a Allen as needed for runny nose Continue Flonase 2 sprays in each nostril once a Allen as needed for stuffy nose Consider saline nasal rinses as needed for nasal symptoms. Use this before any medicated nasal sprays for best result For thick post nasal drainage, begin Mucinex [phone] mg twice a Allen and increase fluid intake to thin out mucus  CVID Restart Hizentra infusions once a week. Consider switching to HyQvia infusions as you need to administer these less requently Keep track of infections and antibiotics required  COVID vaccine Consider getting COVID vaccines. We offer a COVID vaccine clinic in the [facility] office.   Reflux Continue dietary and lifestyle modifications as listed below Continue omeprazole  40 mg once a Allen  Tobacco Stop smoking or try to cut down Written information from up-to-date provided at today's visit  Call the clinic if this treatment plan is not working well for you  Follow up in 2 months or sooner if needed.    Return in about 2 months (around 06/15/2020), or if symptoms worsen or fail to improve.    Thank you for the opportunity to care for this patient.  Please do not hesitate to contact me with questions.  Thermon Leyland, FNP [facility]  ________________________________________________  I have provided oversight concerning [facility]'s evaluation and treatment of this patient's health issues addressed during today's encounter.  I agree with the assessment and therapeutic plan as outlined in the note.   Signed,  R Jorene Guest, MD

## 2020-04-13 NOTE — Patient Instructions (Addendum)
Asthma /COPD overlap Continue Breztri -2 puffs twice a day with a spacer to prevent cough or wheeze. This will take the place of Symbicort and Spiriva Continue montelukast 10 mg once a day to prevent cough or wheeze Continue albuterol 2 puffs every 4 hours as needed for cough or wheeze OR Instead use albuterol 0.083% solution via nebulizer one unit vial every 4 hours as needed for cough or wheeze For asthma flare, begin budesonide 0.5 mg twice a day via nebulizer for 1 to 2 weeks or until cough and wheeze free.   Consider Xolair injections for better control of asthma. Written information provided at today's visit Stop smoking  Allergic rhinitis Continue avoidance measures directed toward grass pollen, tree pollen, ragweed pollen, cat, and dog Continue Xyzal 5 mg once a day as needed for runny nose  Begin azelastine 2 sprays in each nostril twice a day as needed for runny nose Continue Flonase 2 sprays in each nostril once a day as needed for stuffy nose Consider saline nasal rinses as needed for nasal symptoms. Use this before any medicated nasal sprays for best result For thick post nasal drainage, begin Mucinex [phone] mg twice a day and increase fluid intake to thin out mucus  CVID Restart Hizentra infusions once a week. Consider switching to HyQvia infusions as you need to administer these less requently Keep track of infections and antibiotics required  COVID vaccine Consider getting COVID vaccines. We offer a COVID vaccine clinic in the [facility] office.   Reflux Continue dietary and lifestyle modifications as listed below Continue omeprazole 40 mg once a day  Tobacco Stop smoking or try to cut down Written information from up-to-date provided at today's visit  Call the clinic if this treatment plan is not working well for you  Follow up in 2 months or sooner if needed.

## 2020-04-14 ENCOUNTER — Ambulatory Visit (INDEPENDENT_AMBULATORY_CARE_PROVIDER_SITE_OTHER): Payer: Medicaid Other | Admitting: Family Medicine

## 2020-04-14 ENCOUNTER — Encounter: Payer: Self-pay | Admitting: Family Medicine

## 2020-04-14 ENCOUNTER — Other Ambulatory Visit: Payer: Self-pay

## 2020-04-14 VITALS — BP 134/62 | HR 74 | Temp 99.0°F | Resp 16

## 2020-04-14 DIAGNOSIS — K219 Gastro-esophageal reflux disease without esophagitis: Secondary | ICD-10-CM | POA: Diagnosis not present

## 2020-04-14 DIAGNOSIS — Z7185 Encounter for immunization safety counseling: Secondary | ICD-10-CM

## 2020-04-14 DIAGNOSIS — Z720 Tobacco use: Secondary | ICD-10-CM

## 2020-04-14 DIAGNOSIS — J449 Chronic obstructive pulmonary disease, unspecified: Secondary | ICD-10-CM

## 2020-04-14 DIAGNOSIS — J4489 Other specified chronic obstructive pulmonary disease: Secondary | ICD-10-CM

## 2020-04-14 DIAGNOSIS — J3089 Other allergic rhinitis: Secondary | ICD-10-CM | POA: Diagnosis not present

## 2020-04-14 DIAGNOSIS — D839 Common variable immunodeficiency, unspecified: Secondary | ICD-10-CM | POA: Diagnosis not present

## 2020-04-14 DIAGNOSIS — J302 Other seasonal allergic rhinitis: Secondary | ICD-10-CM

## 2020-04-14 MED ORDER — BREZTRI AEROSPHERE 160-9-4.8 MCG/ACT IN AERO: 2.0000 | INHALATION_SPRAY | Freq: Two times a day (BID) | RESPIRATORY_TRACT | 2 refills | Status: DC

## 2020-04-15 ENCOUNTER — Telehealth: Payer: Self-pay

## 2020-04-15 DIAGNOSIS — Z7185 Encounter for immunization safety counseling: Secondary | ICD-10-CM | POA: Insufficient documentation

## 2020-04-15 NOTE — Telephone Encounter (Signed)
PA sent in for [facility] waiting on a reply.

## 2020-04-16 NOTE — Telephone Encounter (Signed)
Thank you :)

## 2020-04-16 NOTE — Telephone Encounter (Signed)
I will print out paperwork for az and me and call pt on Monday about this program

## 2020-04-16 NOTE — Telephone Encounter (Signed)
Pa was denied pt has to fail advair diskus and adviar inhaler first please advise

## 2020-04-16 NOTE — Telephone Encounter (Signed)
That does not make any sense.  We would need to replace Breztri with 2 medications which would be more expensive for the insurance company. Can we do AZ&ME? She has failed Symbicort in the past

## 2020-04-21 NOTE — Telephone Encounter (Signed)
Pt informed of needing to come in and sign az and me paperwork

## 2020-04-21 NOTE — Telephone Encounter (Signed)
Thank you :)

## 2020-04-22 NOTE — Telephone Encounter (Signed)
Paperwork signed and faxed to az and me program

## 2020-05-12 ENCOUNTER — Telehealth: Payer: Self-pay | Admitting: Family Medicine

## 2020-05-12 NOTE — Telephone Encounter (Signed)
Lm for pt to call us back She can buy otc pataday extra strength or zaditor in place of the rx eye drops

## 2020-05-12 NOTE — Telephone Encounter (Signed)
We still have a 5 dollar off coupon for pataday if she is interested in having that.

## 2020-05-12 NOTE — Telephone Encounter (Signed)
Pt. states Ins won't cover eye drops and would like a substitue if possible.

## 2020-05-13 ENCOUNTER — Other Ambulatory Visit: Payer: Self-pay

## 2020-05-13 MED ORDER — OLOPATADINE HCL 0.2 % OP SOLN: 1.0000 [drp] | Freq: Every day | OPHTHALMIC | 5 refills | Status: DC | PRN

## 2020-05-13 NOTE — Telephone Encounter (Signed)
Pt request a substitute that the Ins. Would cover.

## 2020-05-13 NOTE — Telephone Encounter (Signed)
Sent in pataday per anne fnp verbally

## 2020-05-19 NOTE — Telephone Encounter (Signed)
PT called again saying olopatadine is on back order and will need another covered eye drop to be sent in

## 2020-05-19 NOTE — Telephone Encounter (Signed)
Let Kristi Allen know that her insurance didn't cover any other eye drops and that she would have to purchase eye drops over the counter until olopatadine is off back order with her insurance. Told Kristi Allen she can do generic store brand of the pataday or zaditor.

## 2020-05-19 NOTE — Telephone Encounter (Signed)
PT called to follow up on order for eye drops that insurance will cover. Advise pt that carrie verbally called in the medication to her pharmacy. PT will check with pharmacy.

## 2020-05-19 NOTE — Telephone Encounter (Signed)
Left message that her plan doesn't cover any other eye drops and she will have to purchase her eye drops over the counter. zaditor or pataday.

## 2020-05-26 ENCOUNTER — Telehealth: Payer: Self-pay | Admitting: Allergy

## 2020-05-26 NOTE — Telephone Encounter (Signed)
Please have patient follow up with me in February in the [facility] office instead of [facility] for her next appointment.  I want to discuss prefilled syringes for her Hizentra injections.  Thank you.

## 2020-05-28 ENCOUNTER — Other Ambulatory Visit: Payer: Self-pay | Admitting: Family Medicine

## 2020-06-23 ENCOUNTER — Other Ambulatory Visit: Payer: Self-pay | Admitting: Allergy

## 2020-06-28 NOTE — Progress Notes (Signed)
[address] [place] [address] Dept: [phone]  FOLLOW UP NOTE  Patient ID: Kristi Allen, female    DOB: 1956-02-13  Age: 65 y.o. MRN: [id number] Date of Office Visit: 06/29/2020  Assessment  Chief Complaint: Asthma  HPI Kristi Allen is a 65 year old female who presents to the clinic for evaluation of acute asthma exacerbation.  She was last seen in this clinic on 04/14/2020 for evaluation of asthma COPD overlap, common variable immunodeficiency, reflux, sinusitis, tobacco abuse, allergic rhinitis, and vaccine hesitancy.  At today's visit she reports that about 3 weeks ago she began to develop a myriad of symptoms including sinus drainage and cough producing " chunks" that are yellow to green in color.  She reports that the symptoms have increased over the last week in addition to headache over her eyes, shortness of breath, and wheezing at night.  She continues montelukast 10 mg once a day, Symbicort 160-2 puffs twice a day with a spacer, budesonide 0.5 mg twice a day for the last week, and albuterol about every 3-4 hours for the last week.  She continues to smoke 1.5 packs of cigarettes a day.  Allergic rhinitis is reported as poorly controlled with symptoms including clear rhinorrhea, nasal congestion, sneeze, and postnasal drainage.  Allergic conjunctivitis is reported as moderately well controlled with symptoms including red, watery, itchy eyes for which she is using olopatadine as needed.  She continues cetirizine, Flonase, and saline nasal spray.  She reports she has not administered subcutaneous Hizentra for over 1 month because she has " to many other things going on".  Reflux is reported as moderately well controlled with omeprazole twice a day.  She has not yet received COVID-19 vaccines.   Drug Allergies:  Allergies  Allergen Reactions  . Doxycycline Rash  . Cyclobenzaprine   . Hydrocortisone     Breaks out in hives with topical cream.   .  Sulfamethoxazole-Trimethoprim Swelling  . Amoxicillin-Pot Clavulanate Nausea Only  . Codeine Rash    Other reaction(s): RASH    Physical Exam: BP 134/60 (BP Location: Left Arm, Patient Position: Sitting, Cuff Size: Normal)   Pulse 82   Temp 97.7 F (36.5 C) (Temporal)   Resp 20   SpO2 97%    Physical Exam Vitals reviewed.  Constitutional:      Appearance: Normal appearance.  HENT:     Head: Normocephalic and atraumatic.     Right Ear: Tympanic membrane normal.     Left Ear: Tympanic membrane normal.     Nose:     Comments: Bilateral nares erythematous with clear nasal drainage noted.  Pharynx is slightly erythematous with no exudate.  Ears normal.  Eyes normal. Eyes:     Conjunctiva/sclera: Conjunctivae normal.  Cardiovascular:     Rate and Rhythm: Normal rate and regular rhythm.     Heart sounds: Normal heart sounds. No murmur heard.   Pulmonary:     Effort: Pulmonary effort is normal.     Breath sounds: Normal breath sounds.     Comments: Scattered rhonchi noted throughout.  Rhonchi resolved with cough.  Slight scattered wheeze which improved post bronchodilator therapy. Musculoskeletal:        General: Normal range of motion.     Cervical back: Normal range of motion and neck supple.  Skin:    General: Skin is warm and dry.  Neurological:     Mental Status: She is alert and oriented to person, place, and time.  Psychiatric:  Mood and Affect: Mood normal.        Behavior: Behavior normal.        Thought Content: Thought content normal.        Judgment: Judgment normal.     Diagnostics: FVC 1.96, FEV1 1.72.  Predicted FVC 2.96, predicted FEV1 2.27.  Spirometry indicates mild restriction.  Postbronchodilator therapy FVC 2.00, FEV1 1.72.  Postbronchodilator spirometry indicates mild restriction with no significant bronchodilator response.  Assessment and Plan: 1. Asthma-COPD overlap syndrome ([place])   2. CVID (common variable immunodeficiency) ([place])   3.  Seasonal and perennial allergic rhinitis   4. Tobacco use   5. Gastroesophageal reflux disease, unspecified whether esophagitis present   6. Vaccine counseling     Meds ordered this encounter  Medications  . albuterol (PROVENTIL) (2.5 MG/3ML) 0.083% nebulizer solution    Sig: Take 3 mLs (2.5 mg total) by nebulization every 6 (six) hours as needed for wheezing or shortness of breath.    Dispense:  75 mL    Refill:  1  . albuterol (VENTOLIN HFA) 108 (90 Base) MCG/ACT inhaler    Sig: Inhale 2 puffs into the lungs every 4 (four) hours as needed for wheezing or shortness of breath (coughing fits).    Dispense:  18 g    Refill:  1  . loratadine (CLARITIN) 10 MG tablet    Sig: Take 1 tablet (10 mg total) by mouth daily.    Dispense:  30 tablet    Refill:  5  . budesonide (PULMICORT) 0.5 MG/2ML nebulizer solution    Sig: USE ONE VIAL IN NEBULIZER IN THE MORNING AND AT BEDTIME ONLY AS NEEDED    Dispense:  120 mL    Refill:  5  . ipratropium-albuterol (DUONEB) 0.5-2.5 (3) MG/3ML SOLN    Sig: Take 3 mLs by nebulization every 6 (six) hours as needed.    Dispense:  360 mL    Refill:  1    Patient Instructions  Asthma /COPD overlap Get a chest xray. We will evaluate the treatment plan once we get the results of the chest xray Begin prednisone 10 mg tablets. Take 2 tablets twice a day for 3 days, then take 2 tablets once a day for 1 day, then take 1 tablet on the 5th day, then stop Restart Spiriva 1.25 mcg-2 puffs once a day to prevent cough or wheeze Continue Symbicort 160-2 puffs twice a day with a spacer to prevent cough or wheeze Continue montelukast 10 mg once a day to prevent cough or wheeze Continue albuterol 2 puffs every 4 hours as needed for cough or wheeze OR instead use albuterol 0.083% solution via nebulizer one unit vial every 4 hours as needed for cough or wheeze Instead of albuterol you may use DuoNeb once every 6 hours as needed for cough or wheeze For asthma flare, begin  budesonide 0.5 mg twice a day via nebulizer for 1 to 2 weeks or until cough and wheeze free.   Consider Xolair injections for better control of asthma. Written information provided at today's visit Stop smoking  Allergic rhinitis Continue avoidance measures directed toward grass pollen, tree pollen, ragweed pollen, cat, and dog Continue Claritin 10 mg once a day as needed for runny nose  Continue azelastine 2 sprays in each nostril twice a day as needed for runny nose Continue Flonase 2 sprays in each nostril once a day as needed for stuffy nose Consider saline nasal rinses as needed for nasal symptoms. Use this before any  medicated nasal sprays for best result For thick post nasal drainage, begin Mucinex [phone] mg twice a day and increase fluid intake to thin out mucus  Allergic conjunctivitis Some over the counter eye drops include Pataday one drop in each eye once a day as needed for red, itchy eyes OR Zaditor one drop in each eye twice a day as needed for red itchy eyes.  CVID Restart Hizentra infusions once a week. Consider switching to HyQvia infusions as you need to administer these less frequently. Let us know when you are ready to switch Keep track of infections and antibiotics required  COVID vaccine Consider getting COVID vaccines. We offer a COVID vaccine clinic in the [facility] office.   Reflux Continue dietary and lifestyle modifications as listed below Continue omeprazole as prescribed previously  Tobacco Stop smoking or try to cut down Written information from up-to-date provided at today's visit  Call the clinic if this treatment plan is not working well for you  Follow up in 1 month or sooner if needed.    Return in about 4 weeks (around 07/27/2020), or if symptoms worsen or fail to improve.    Thank you for the opportunity to care for this patient.  Please do not hesitate to contact me with questions.  Gareth Morgan, FNP [facility]

## 2020-06-28 NOTE — Patient Instructions (Addendum)
Asthma /COPD overlap Get a chest xray. We will evaluate the treatment plan once we get the results of the chest xray Begin prednisone 10 mg tablets. Take 2 tablets twice a day for 3 days, then take 2 tablets once a day for 1 day, then take 1 tablet on the 5th day, then stop Restart Spiriva 1.25 mcg-2 puffs once a day to prevent cough or wheeze Continue Symbicort 160-2 puffs twice a day with a spacer to prevent cough or wheeze Continue montelukast 10 mg once a day to prevent cough or wheeze Continue albuterol 2 puffs every 4 hours as needed for cough or wheeze OR instead use albuterol 0.083% solution via nebulizer one unit vial every 4 hours as needed for cough or wheeze Instead of albuterol you may use DuoNeb once every 6 hours as needed for cough or wheeze For asthma flare, begin budesonide 0.5 mg twice a day via nebulizer for 1 to 2 weeks or until cough and wheeze free.   Consider Xolair injections for better control of asthma. Written information provided at today's visit Stop smoking  Allergic rhinitis Continue avoidance measures directed toward grass pollen, tree pollen, ragweed pollen, cat, and dog Continue Claritin 10 mg once a day as needed for runny nose  Continue azelastine 2 sprays in each nostril twice a day as needed for runny nose Continue Flonase 2 sprays in each nostril once a day as needed for stuffy nose Consider saline nasal rinses as needed for nasal symptoms. Use this before any medicated nasal sprays for best result For thick post nasal drainage, begin Mucinex [phone] mg twice a day and increase fluid intake to thin out mucus  Allergic conjunctivitis Some over the counter eye drops include Pataday one drop in each eye once a day as needed for red, itchy eyes OR Zaditor one drop in each eye twice a day as needed for red itchy eyes.  CVID Restart Hizentra infusions once a week. Consider switching to HyQvia infusions as you need to administer these less frequently. Let us  know when you are ready to switch Keep track of infections and antibiotics required  COVID vaccine Consider getting COVID vaccines. We offer a COVID vaccine clinic in the [facility] office.   Reflux Continue dietary and lifestyle modifications as listed below Continue omeprazole as prescribed previously  Tobacco Stop smoking or try to cut down Written information from up-to-date provided at today's visit  Call the clinic if this treatment plan is not working well for you  Follow up in 1 month or sooner if needed.

## 2020-06-29 ENCOUNTER — Other Ambulatory Visit: Payer: Self-pay

## 2020-06-29 ENCOUNTER — Encounter: Payer: Self-pay | Admitting: Family Medicine

## 2020-06-29 ENCOUNTER — Ambulatory Visit (INDEPENDENT_AMBULATORY_CARE_PROVIDER_SITE_OTHER): Payer: Medicaid Other | Admitting: Family Medicine

## 2020-06-29 VITALS — BP 134/60 | HR 82 | Temp 97.7°F | Resp 20

## 2020-06-29 DIAGNOSIS — J449 Chronic obstructive pulmonary disease, unspecified: Secondary | ICD-10-CM

## 2020-06-29 DIAGNOSIS — Z7185 Encounter for immunization safety counseling: Secondary | ICD-10-CM

## 2020-06-29 DIAGNOSIS — D839 Common variable immunodeficiency, unspecified: Secondary | ICD-10-CM

## 2020-06-29 DIAGNOSIS — J3089 Other allergic rhinitis: Secondary | ICD-10-CM

## 2020-06-29 DIAGNOSIS — Z720 Tobacco use: Secondary | ICD-10-CM

## 2020-06-29 DIAGNOSIS — K219 Gastro-esophageal reflux disease without esophagitis: Secondary | ICD-10-CM

## 2020-06-29 DIAGNOSIS — J302 Other seasonal allergic rhinitis: Secondary | ICD-10-CM

## 2020-06-29 MED ORDER — ALBUTEROL SULFATE HFA 108 (90 BASE) MCG/ACT IN AERS: 2.0000 | INHALATION_SPRAY | RESPIRATORY_TRACT | 1 refills | Status: DC | PRN

## 2020-06-29 MED ORDER — BUDESONIDE 0.5 MG/2ML IN SUSP: RESPIRATORY_TRACT | 5 refills | Status: DC

## 2020-06-29 MED ORDER — IPRATROPIUM-ALBUTEROL 0.5-2.5 (3) MG/3ML IN SOLN: 3.0000 mL | Freq: Four times a day (QID) | RESPIRATORY_TRACT | 1 refills | Status: DC | PRN

## 2020-06-29 MED ORDER — LORATADINE 10 MG PO TABS: 10.0000 mg | ORAL_TABLET | Freq: Every day | ORAL | 5 refills | Status: DC

## 2020-06-29 MED ORDER — ALBUTEROL SULFATE (2.5 MG/3ML) 0.083% IN NEBU: 2.5000 mg | INHALATION_SOLUTION | Freq: Four times a day (QID) | RESPIRATORY_TRACT | 1 refills | Status: DC | PRN

## 2020-06-30 ENCOUNTER — Telehealth: Payer: Self-pay | Admitting: Family Medicine

## 2020-06-30 NOTE — Addendum Note (Signed)
Addended by: Berna Bue on: 06/30/2020 04:14 PM   Modules accepted: Orders

## 2020-06-30 NOTE — Addendum Note (Signed)
Addended by: Robet Leu A on: 06/30/2020 04:25 PM   Modules accepted: Orders

## 2020-06-30 NOTE — Addendum Note (Signed)
Addended by: Berna Bue on: 06/30/2020 04:18 PM   Modules accepted: Orders

## 2020-07-01 ENCOUNTER — Telehealth: Payer: Self-pay | Admitting: Family Medicine

## 2020-07-01 NOTE — Telephone Encounter (Signed)
Tried calling pt but just get a busy tone

## 2020-07-01 NOTE — Telephone Encounter (Signed)
Patients has question about meds please advise

## 2020-07-01 NOTE — Telephone Encounter (Signed)
Spoke to pt and answered the questions about her refills sent in yesterday

## 2020-07-03 ENCOUNTER — Telehealth: Payer: Self-pay | Admitting: Family Medicine

## 2020-07-03 ENCOUNTER — Other Ambulatory Visit: Payer: Self-pay | Admitting: Family Medicine

## 2020-07-03 MED ORDER — AZITHROMYCIN 250 MG PO TABS: ORAL_TABLET | ORAL | 0 refills | Status: DC

## 2020-07-03 NOTE — Telephone Encounter (Signed)
Unable to leave a phone message.

## 2020-07-03 NOTE — Progress Notes (Signed)
Azithromycin ordered. Chest xray clear. Unable to reach patient.

## 2020-07-26 ENCOUNTER — Telehealth: Payer: Self-pay | Admitting: Family Medicine

## 2020-07-26 NOTE — Telephone Encounter (Signed)
Thurston Hole said she could get her Hyqvia today and she has been self taught on how to administer infusions. I scheduled her for her covid vaccine for 3/31. Per Thurston Hole she just needs to wait atleast 3 days inbetween infusions and covid vaccine.

## 2020-07-26 NOTE — Telephone Encounter (Signed)
Pt requests a call back from a nurse.

## 2020-07-26 NOTE — Telephone Encounter (Signed)
Kristi Allen informed that Kristi Allen had started on prednisone that was given to her by a friend because she is having shortness of breath and congestion. She would like to know if she needs to wait 2 weeks after taking prednisone to get her Hyqvia infusion.

## 2020-07-27 ENCOUNTER — Ambulatory Visit: Payer: Medicaid Other | Admitting: Family Medicine

## 2020-07-27 NOTE — Telephone Encounter (Signed)
Can you please call this patient and ask how she is breathing? Please set up an appointment if she needs to be seen. Thank you

## 2020-07-27 NOTE — Telephone Encounter (Signed)
Lm for pt to call us back about how she is doing

## 2020-07-30 NOTE — Telephone Encounter (Signed)
Spoke with pt and she is scheduled Tuesday at 4pm

## 2020-07-30 NOTE — Telephone Encounter (Signed)
Lm for pt to call us back  

## 2020-08-02 NOTE — Patient Instructions (Addendum)
Asthma /COPD overlap Begin prednisone 10 mg tablets. Take 2 tablets twice a day for 3 days, then take 2 tablets once a day for 1 day, then take 1 tablet on the 5th day, then stop Restart Spiriva 1.25 mcg-2 puffs once a day to prevent cough or wheeze Continue Symbicort 160-2 puffs twice a day with a spacer to prevent cough or wheeze Continue montelukast 10 mg once a day to prevent cough or wheeze Continue albuterol 2 puffs every 4 hours as needed for cough or wheeze OR instead use albuterol 0.083% solution via nebulizer one unit vial every 4 hours as needed for cough or wheeze Instead of albuterol you may use DuoNeb once every 6 hours as needed for cough or wheeze For asthma flare, begin budesonide 0.5 mg twice a day via nebulizer for 1 to 2 weeks or until cough and wheeze free.   Consider Tezspire or Xolair injections for better control of asthma. We will give you information on Tezspire at your next office visit  Allergic rhinitis Start azithromycin 250 mg taking 2 tablets on the first day, then 1 tablet for 4 days then stop. Continue avoidance measures directed toward grass pollen, tree pollen, ragweed pollen, cat, and dog Continue Claritin 10 mg once a day as needed for runny nose  Stop  Azelastine for one week due to nose bleeds and then start back using 2 sprays in each nostril twice a day as needed for runny nose Stop Flonase for one week due to nose bleeds and then start back using 2 sprays in each nostril once a day as needed for stuffy nose. In the right nostril, point the applicator out toward the right ear. In the left nostril, point the applicator out toward the left ear Consider saline nasal rinses as needed for nasal symptoms. Use this before any medicated nasal sprays for best result For thick post nasal drainage, begin Mucinex [phone] mg twice a day and increase fluid intake to thin out mucus  Allergic conjunctivitis Some over the counter eye drops include Pataday one drop in  each eye once a day as needed for red, itchy eyes OR Zaditor one drop in each eye twice a day as needed for red itchy eyes.  CVID Restart Hizentra infusions once a week.Consider starting Gammaplex (an IVIG infusion) where the nurse will come to you to do the infusion. You can discuss this with Dr. Maudie Mercury at your next appointment Keep track of infections and antibiotics required We will get lab work to follow-up: CBC with differential, IgG, IgA, IgM, CMP, and ESR. We will call you with results  COVID vaccine Consider getting COVID vaccines. We offer a COVID vaccine clinic in the [facility] office.   Reflux Continue dietary and lifestyle modifications as listed below Continue omeprazole as prescribed previously  Tobacco Stop smoking or try to cut down Written information from up-to-date provided at today's visit  Epistaxis Pinch both nostrils while leaning forward for at least 5 minutes before checking to see if the bleeding has stopped. If bleeding is not controlled within 5-10 minutes apply a cotton ball soaked with oxymetazoline (Afrin) to the bleeding nostril for a few seconds.  If the problem persists or worsens a referral to ENT for further evaluation may be necessary.   Call the clinic if this treatment plan is not working well for you  Schedule a follow up in 2 weeks or sooner if needed with Dr. Maudie Mercury

## 2020-08-03 ENCOUNTER — Ambulatory Visit (INDEPENDENT_AMBULATORY_CARE_PROVIDER_SITE_OTHER): Payer: Medicaid Other | Admitting: Family

## 2020-08-03 ENCOUNTER — Encounter: Payer: Self-pay | Admitting: Family

## 2020-08-03 ENCOUNTER — Other Ambulatory Visit: Payer: Self-pay

## 2020-08-03 VITALS — BP 142/68 | HR 88 | Temp 98.5°F | Resp 16

## 2020-08-03 DIAGNOSIS — J3089 Other allergic rhinitis: Secondary | ICD-10-CM | POA: Diagnosis not present

## 2020-08-03 DIAGNOSIS — K219 Gastro-esophageal reflux disease without esophagitis: Secondary | ICD-10-CM

## 2020-08-03 DIAGNOSIS — Z720 Tobacco use: Secondary | ICD-10-CM

## 2020-08-03 DIAGNOSIS — J302 Other seasonal allergic rhinitis: Secondary | ICD-10-CM

## 2020-08-03 DIAGNOSIS — R040 Epistaxis: Secondary | ICD-10-CM

## 2020-08-03 DIAGNOSIS — J0100 Acute maxillary sinusitis, unspecified: Secondary | ICD-10-CM

## 2020-08-03 DIAGNOSIS — D839 Common variable immunodeficiency, unspecified: Secondary | ICD-10-CM

## 2020-08-03 DIAGNOSIS — J449 Chronic obstructive pulmonary disease, unspecified: Secondary | ICD-10-CM

## 2020-08-03 MED ORDER — AZITHROMYCIN 250 MG PO TABS: ORAL_TABLET | ORAL | 0 refills | Status: DC

## 2020-08-03 MED ORDER — SPIRIVA RESPIMAT 1.25 MCG/ACT IN AERS: 2.0000 | INHALATION_SPRAY | Freq: Every day | RESPIRATORY_TRACT | 3 refills | Status: DC

## 2020-08-03 NOTE — Progress Notes (Signed)
[address] [place] [address] Dept: [phone]  FOLLOW UP NOTE  Patient ID: Kristi Allen, female    DOB: March 01, 1956  Age: 65 y.o. MRN: [id number] Date of Office Visit: 08/03/2020  Assessment  Chief Complaint: Asthma and Allergies  HPI Kristi Allen is a 65 year old female who presents today for an acute visit.  She was last seen on June 29, 2020 by Gareth Morgan, FNP for asthma-COPD overlap syndrome, common variable immunodeficiency, seasonal and perennial allergic rhinitis, tobacco use, gastroesophageal reflux disease, and vaccine counseling.  Asthma/COPD overlap syndrome is reported as not well controlled with Symbicort 160/4.5 mcg 2 puffs twice a day with spacer, montelukast 10 mg once a day, and albuterol as needed, or DuoNeb as needed.  She is not using Spiriva Respimat 1.25 mcg due to saying that [facility] has not called her to say that the prescription is there.  She reports for the past week she has had a productive cough that is at times with yellow sputum, wheezing in the morning and in the evening, shortness of breath at times, and nocturnal awakenings due to breathing problems.  She denies any tightness in her chest, fever, or chills.  Since her last office visit she has not made any trips to the emergency room or urgent care due to breathing problems.  She did take some prednisone that her sister gave her.  She reports that she took 10 mg once a day for about approximately a week.  She finished this a week ago.  She is currently using her albuterol inhaler every 4 hours, her albuterol nebulizer every 3 hours and DuoNeb every 3 hours.  She has not started using budesonide 0.5 mg twice a day for asthma flares.  She continues to smoke 1-1/2 packs/day.  Seasonal and perennial allergic rhinitis is reported as not well controlled with Claritin 10 mg once a day, azelastine 2 sprays each nostril twice a day, and Flonase 2 sprays each nostril once a day.  She reports yellow  rhinorrhea, nasal congestion, and sinus tenderness for the past week.  She wonders if she has a sinus infection.  She also reports that for the past week that the left side of her nose has had nosebleeds.  She is able to stop the bleeding within 5 minutes.  Allergic conjunctivitis is reported as moderately controlled with unknown medication.  She reports occasional itchy watery eyes that also burn.  She does report that the medication she has for this does help.  Common variable immunodeficiency is reported as not well controlled.  She reports that she is not done a Hizentra infusion in at least 2 months.  She is interested in potentially starting Gammaplex since it is IVIG and someone will come and do the infusion for her.  Since her last office visit she is not required any other antibiotics.  She is still not had any of her COVID-19 vaccines.  Reflux is reported as controlled with omeprazole twice a day.     Drug Allergies:  Allergies  Allergen Reactions  . Doxycycline Rash  . Cyclobenzaprine   . Hydrocortisone     Breaks out in hives with topical cream.   . Sulfamethoxazole-Trimethoprim Swelling  . Amoxicillin-Pot Clavulanate Nausea Only  . Codeine Rash    Other reaction(s): RASH    Review of Systems: Review of Systems  Constitutional: Negative for chills and fever.  HENT: Positive for nosebleeds.        Reports yellow rhinorrhea, nasal congestion, and sinus  tenderness  Eyes:       Reports occasional itchy watery/burning eyes for which her eyedrops help.  Respiratory: Positive for cough, shortness of breath and wheezing.        Reports a cough that is productive at times with yellow sputum.  Also reports shortness of breath at times and wheezing in the morning and in the evening  Cardiovascular: Negative for chest pain and palpitations.  Gastrointestinal: Negative for heartburn.  Genitourinary: Negative for dysuria.  Skin: Positive for itching. Negative for rash.   Neurological: Positive for headaches.       Reports occasional headaches  Endo/Heme/Allergies: Positive for environmental allergies.    Physical Exam: BP (!) 142/68   Pulse 88   Temp 98.5 F (36.9 C) (Temporal)   Resp 16   SpO2 96%    Physical Exam Constitutional:      Appearance: Normal appearance.  HENT:     Head: Normocephalic and atraumatic.     Comments: Pharynx normal, eyes normal, ears: Unable to visualize left tympanic membrane due to cerumen.  Right tympanic membrane normal.  Nose bilateral lower turbinates mildly edematous with no drainage noted.  Irritation noted in left nostril    Right Ear: Tympanic membrane, ear canal and external ear normal.     Left Ear: Ear canal and external ear normal.     Mouth/Throat:     Mouth: Mucous membranes are moist.     Pharynx: Oropharynx is clear.  Eyes:     Conjunctiva/sclera: Conjunctivae normal.  Cardiovascular:     Rate and Rhythm: Regular rhythm.     Heart sounds: Normal heart sounds.  Pulmonary:     Effort: Pulmonary effort is normal.     Comments: Scattered rhonchi noted throughout which resolved with coughing. Musculoskeletal:     Cervical back: Neck supple.  Skin:    General: Skin is warm.  Neurological:     Mental Status: She is alert and oriented to person, place, and time.  Psychiatric:        Mood and Affect: Mood normal.        Behavior: Behavior normal.        Thought Content: Thought content normal.        Judgment: Judgment normal.     Diagnostics: FVC 1.63 L, FEV1 1.49 L.  Predicted FVC 2.93 L, FEV1 2.24 L.  Spirometry indicates moderate restriction.  She was only able to blow one time due to abdominal pain with blowing  Assessment and Plan: 1. Asthma-COPD overlap syndrome ([place])   2. CVID (common variable immunodeficiency) ([place])   3. Seasonal and perennial allergic rhinitis   4. Tobacco use   5. Gastroesophageal reflux disease, unspecified whether esophagitis present   6. Acute maxillary  sinusitis, recurrence not specified   7. Epistaxis     Meds ordered this encounter  Medications  . Tiotropium Bromide Monohydrate (SPIRIVA RESPIMAT) 1.25 MCG/ACT AERS    Sig: Inhale 2 puffs into the lungs daily.    Dispense:  4 g    Refill:  3  . azithromycin (ZITHROMAX) 250 MG tablet    Sig: Taking 2 tablets on the first day, then 1 tablet for 4 days then stop    Dispense:  6 tablet    Refill:  0    Patient Instructions  Asthma /COPD overlap Begin prednisone 10 mg tablets. Take 2 tablets twice a day for 3 days, then take 2 tablets once a day for 1 day, then take 1 tablet on  the 5th day, then stop Restart Spiriva 1.25 mcg-2 puffs once a day to prevent cough or wheeze Continue Symbicort 160-2 puffs twice a day with a spacer to prevent cough or wheeze Continue montelukast 10 mg once a day to prevent cough or wheeze Continue albuterol 2 puffs every 4 hours as needed for cough or wheeze OR instead use albuterol 0.083% solution via nebulizer one unit vial every 4 hours as needed for cough or wheeze Instead of albuterol you may use DuoNeb once every 6 hours as needed for cough or wheeze For asthma flare, begin budesonide 0.5 mg twice a day via nebulizer for 1 to 2 weeks or until cough and wheeze free.   Consider Tezspire or Xolair injections for better control of asthma. We will give you information on Tezspire at your next office visit  Allergic rhinitis Start azithromycin 250 mg taking 2 tablets on the first day, then 1 tablet for 4 days then stop. Continue avoidance measures directed toward grass pollen, tree pollen, ragweed pollen, cat, and dog Continue Claritin 10 mg once a day as needed for runny nose  Stop  Azelastine for one week due to nose bleeds and then start back using 2 sprays in each nostril twice a day as needed for runny nose Stop Flonase for one week due to nose bleeds and then start back using 2 sprays in each nostril once a day as needed for stuffy nose. In the right  nostril, point the applicator out toward the right ear. In the left nostril, point the applicator out toward the left ear Consider saline nasal rinses as needed for nasal symptoms. Use this before any medicated nasal sprays for best result For thick post nasal drainage, begin Mucinex [phone] mg twice a day and increase fluid intake to thin out mucus  Allergic conjunctivitis Some over the counter eye drops include Pataday one drop in each eye once a day as needed for red, itchy eyes OR Zaditor one drop in each eye twice a day as needed for red itchy eyes.  CVID Restart Hizentra infusions once a week.Consider starting Gammaplex (an IVIG infusion) where the nurse will come to you to do the infusion. You can discuss this with Dr. Maudie Mercury at your next appointment Keep track of infections and antibiotics required We will get lab work to follow-up: CBC with differential, IgG, IgA, IgM, CMP, and ESR. We will call you with results  COVID vaccine Consider getting COVID vaccines. We offer a COVID vaccine clinic in the [facility] office.   Reflux Continue dietary and lifestyle modifications as listed below Continue omeprazole as prescribed previously  Tobacco Stop smoking or try to cut down Written information from up-to-date provided at today's visit  Epistaxis Pinch both nostrils while leaning forward for at least 5 minutes before checking to see if the bleeding has stopped. If bleeding is not controlled within 5-10 minutes apply a cotton ball soaked with oxymetazoline (Afrin) to the bleeding nostril for a few seconds.  If the problem persists or worsens a referral to ENT for further evaluation may be necessary.   Call the clinic if this treatment plan is not working well for you  Schedule a follow up in 2 weeks or sooner if needed with Dr. Maudie Mercury    Return in about 2 weeks (around 08/17/2020) for schedule with Dr. Maudie Mercury.    Thank you for the opportunity to care for this patient.  Please do not  hesitate to contact me with questions.  Altha Harm  Kristi Allen, [facility]

## 2020-08-05 ENCOUNTER — Ambulatory Visit: Payer: Self-pay

## 2020-08-05 LAB — CBC WITH DIFFERENTIAL
Basophils Absolute: 0 10*3/uL (ref 0.0–0.2)
Basos: 0 %
EOS (ABSOLUTE): 0 10*3/uL (ref 0.0–0.4)
Eos: 0 %
Hematocrit: 46 % (ref 34.0–46.6)
Hemoglobin: 15.5 g/dL (ref 11.1–15.9)
Immature Grans (Abs): 0.1 10*3/uL (ref 0.0–0.1)
Immature Granulocytes: 1 %
Lymphocytes Absolute: 1.9 10*3/uL (ref 0.7–3.1)
Lymphs: 11 %
MCH: 29.4 pg (ref 26.6–33.0)
MCHC: 33.7 g/dL (ref 31.5–35.7)
MCV: 87 fL (ref 79–97)
Monocytes Absolute: 1.2 10*3/uL — ABNORMAL HIGH (ref 0.1–0.9)
Monocytes: 7 %
Neutrophils Absolute: 13.8 10*3/uL — ABNORMAL HIGH (ref 1.4–7.0)
Neutrophils: 81 %
RBC: 5.27 x10E6/uL (ref 3.77–5.28)
RDW: 13 % (ref 11.7–15.4)
WBC: 17 10*3/uL — ABNORMAL HIGH (ref 3.4–10.8)

## 2020-08-05 LAB — COMPREHENSIVE METABOLIC PANEL
ALT: 11 IU/L (ref 0–32)
AST: 14 IU/L (ref 0–40)
Albumin/Globulin Ratio: 2.3 — ABNORMAL HIGH (ref 1.2–2.2)
Albumin: 4.9 g/dL — ABNORMAL HIGH (ref 3.8–4.8)
Alkaline Phosphatase: 109 IU/L (ref 44–121)
BUN/Creatinine Ratio: 26 (ref 12–28)
BUN: 23 mg/dL (ref 8–27)
Bilirubin Total: 0.4 mg/dL (ref 0.0–1.2)
CO2: 21 mmol/L (ref 20–29)
Calcium: 9.8 mg/dL (ref 8.7–10.3)
Chloride: 98 mmol/L (ref 96–106)
Creatinine, Ser: 0.9 mg/dL (ref 0.57–1.00)
Globulin, Total: 2.1 g/dL (ref 1.5–4.5)
Glucose: 95 mg/dL (ref 65–99)
Potassium: 3.6 mmol/L (ref 3.5–5.2)
Sodium: 139 mmol/L (ref 134–144)
Total Protein: 7 g/dL (ref 6.0–8.5)
eGFR: 71 mL/min/{1.73_m2} (ref >59)

## 2020-08-05 LAB — IGG, IGA, IGM
IgA/Immunoglobulin A, Serum: 133 mg/dL (ref 87–352)
IgG (Immunoglobin G), Serum: 577 mg/dL — ABNORMAL LOW (ref 586–1602)
IgM (Immunoglobulin M), Srm: 54 mg/dL (ref 26–217)

## 2020-08-05 LAB — SEDIMENTATION RATE: Sed Rate: 18 mm/hr (ref 0–40)

## 2020-08-05 NOTE — Progress Notes (Signed)
Spoke with Dr.Kim and she will discuss at her next follow up appointment on 08/11/20 @11 :30 am.

## 2020-08-10 ENCOUNTER — Ambulatory Visit: Payer: Medicaid Other | Admitting: Family Medicine

## 2020-08-10 NOTE — Progress Notes (Signed)
Follow Up Note  RE: Kristi Allen MRN: [id number] DOB: [birth year] Date of Office Visit: 08/11/2020  Referring provider: List, Nash Shearer, FNP Primary care provider: List, Nash Shearer, FNP  Chief Complaint: Allergic Rhinitis  (Sneezing and coughing (has gotten better since last visit) Coughing up mucus)  History of Present Illness: I had the pleasure of seeing Kristi Allen for a follow up visit at the [facility] on 08/11/2020. She is a 65 y.o. female, who is being followed for CVID on IgG replacement, asthma-COPD overlap syndrome, allergic rhino conjunctivitis, reflux. Her previous allergy office visit was on 08/03/2020 with Nehemiah Settle FNP. Today is a regular follow up visit.  Asthma /COPD overlap Finished prednisone and zpak and her breathing is better.  Currently taking Spiriva 1.68mcg 2 puffs once a day and Symbicort 2 puffs BID.  Taking Proair 2 puffs every 3-4 hours and using albuterol nebulizer every 4-5 hours still.  Using budesonide nebulizer twice a day during flares.   Allergic rhino conjunctivitis Taking Claritin in the morning and Singulair in the afternoon. Restarted on Flonase and saline. Stopped for a bit due to epistaxis.  Using eye drops once a day with good benefit.  Wants to know if she can go back on allergy shots.   CVID Patient has not been using Hizentra at home for the past 2-3 months and now doesn't even remember how to set up the infusions.  She is interested in doing IVIG infusions at home if possible.  Patient just finished azithromycin and prednisone.  COVID vaccine Concerned about getting the COVID-19 vaccine and its side effects.   Reflux Taking omeprazole twice a day.   Assessment and Plan: Kristi Allen is a 65 y.o. female with: CVID (common variable immunodeficiency) (HCC) Past history - Patient was diagnosed with CVID prior to coming to our office and has been on weekly Hizentra 7gm injections with good benefit. She  mentions skipping few doses at times due to inability to self-administer due to her coughing fits. Patient had bronchiectasis on CT chest in past. Still smoking 1.5 pack per day.  Interim history - no Hizentra infusions for the past 2-3 months and now patient doesn't remember how to set it up. IgG down to 577 and just finished antibiotics for URI.  Discussed with patient at length that she needs IgG replacement therapy and wants to know if she can switch to IV infusions as then someone else would administer for her as she has difficulty with self-administration of the Hizentra. Risks and benefits discussed in depth.  Start IVIG infusions and stop Hizentra for now.   Keep track of infections.  And if you have another infection then let us know.   Recommend COVID-19 vaccination.  Get bloodwork every 3 months to closely monitor IgG values with new infusion method.  Asthma-COPD overlap syndrome (HCC) Past history - Patient used to follow with pulmonology as well. Had bronchiectasis on CT chest and failed to follow up. Still smoking 1.5 pack per day which is less than her previous of 3 packs per day. Using Symbicort 160 2 puffs every few hours and albuterol on a daily basis due to coughing, wheezing and shortness of breath. Normal alpha-1 level. Eosinophils 100, IgE 458. Interim history - improved with recent prednisone taper. Still smoking.  Discussed smoking cessation and strongly encouraged to stop smoking.  May use Mucinex twice a day for chest congestion/mucous production. Take with plenty of water.  Samples given.  READ about Xolair injections  and Tezspire injections.  Start PA process.   START budesonide 0.5mg  nebulizer twice a day for the next 1 week.   Daily controller medication(s):continue Symbicort 2 puffs twice a day with spacer and rinse mouth afterwards.  Continue Spiriva 1.15mcg 2 puffs once a day.  During upper respiratory infections/asthma flares: start  budesonide nebulizer for 1-2 weeks until your breathing symptoms return to baseline.   Get spirometry at next visit.   Seasonal and perennial allergic rhinitis Past history - 2021 bloodwork positive to cat, dog, grass, trees, ragweed.  Interim history - stable.  Take loratadine 10mg  daily in the morning.   Continue with Singulair (montelukast) 10mg  daily at night.  Use Fluticasone 1 spray per nostril twice a day for nasal congestion.  Use azelastine 1-2 sprays per nostril twice a day for drainage.  Nasal saline spray (i.e., Simply Saline) or nasal saline lavage (i.e., NeilMed) is recommended as needed and prior to medicated nasal sprays.  Continue environmental control measures.  No allergy immunotherapy until patient's asthma is under better control.   Gastroesophageal reflux disease  Continue omeprazole 20mg  twice a day.  Continue lifestyle and dietary modifications.  Vaccine counseling  Discussed risks and benefits.   Get COVID-19 vaccine 2 weeks after the last prednisone was taken (after April 18th)  Call the [facility] office and make appointment to get the COVID-19 vaccine.   Return in about 2 months (around 10/11/2020).  No orders of the defined types were placed in this encounter.  Lab Orders  No laboratory test(s) ordered today    Diagnostics: None.  Medication List:  Current Outpatient Medications  Medication Sig Dispense Refill  . albuterol (PROVENTIL) (2.5 MG/3ML) 0.083% nebulizer solution Take 3 mLs (2.5 mg total) by nebulization every 6 (six) hours as needed for wheezing or shortness of breath. 75 mL 1  . albuterol (VENTOLIN HFA) 108 (90 Base) MCG/ACT inhaler Inhale 2 puffs into the lungs every 4 (four) hours as needed for wheezing or shortness of breath (coughing fits). 18 g 1  . amLODipine (NORVASC) 10 MG tablet Take 10 mg by mouth daily.    April 20 atorvastatin (LIPITOR) 10 MG tablet Take 10 mg by mouth daily.    . Azelastine HCl 0.15 % SOLN Place 1-2  sprays into the nose in the morning and at bedtime. For drainage 30 mL 2  . budesonide (PULMICORT) 0.5 MG/2ML nebulizer solution USE ONE VIAL IN NEBULIZER IN THE MORNING AND AT BEDTIME ONLY AS NEEDED 120 mL 5  . Calcium Carb-Cholecalciferol (CALCIUM CARBONATE-VITAMIN D3) 600-400 MG-UNIT TABS TAKE ONE TABLET BY MOUTH 2 TIMES A DAY    . dextromethorphan-guaiFENesin (MUCINEX DM) 30-600 MG 12hr tablet Take 1 tablet by mouth 2 (two) times daily.    . DULoxetine (CYMBALTA) 60 MG capsule 2 capsules daily.    . fluticasone (FLONASE) 50 MCG/ACT nasal spray Place 1 spray into both nostrils in the morning and at bedtime. For drainage 16 g 2  . hydrochlorothiazide (HYDRODIURIL) 25 MG tablet Take 25 mg by mouth daily.    . Immune Globulin, Human, (HIZENTRA [place]) Inject 7 g into the skin once a week. Hizentra 20%  7 grams (35 ml) one time every week.    [facility] ipratropium-albuterol (DUONEB) 0.5-2.5 (3) MG/3ML SOLN Take 3 mLs by nebulization every 6 (six) hours as needed. 360 mL 5  . loratadine (CLARITIN) 10 MG tablet Take 1 tablet (10 mg total) by mouth daily. 30 tablet 5  . losartan (COZAAR) 50 MG tablet Take  50 mg by mouth daily.    . montelukast (SINGULAIR) 10 MG tablet TAKE 1 TABLET BY MOUTH EACH NIGHT AT BEDTIME 30 tablet 3  . Olopatadine HCl (PATADAY) 0.2 % SOLN Place 1 drop into both eyes daily as needed. 2.5 mL 5  . omeprazole (PRILOSEC) 20 MG capsule Take 20 mg by mouth 2 (two) times daily.    Marland [employer] Respiratory Therapy Supplies (FLUTTER) DEVI 1 Device by Does not apply route as directed. 1 each 0  . SYMBICORT 160-4.5 MCG/ACT inhaler SMARTSIG:2 Puff(s) By Mouth Morning-Night    . Tiotropium Bromide Monohydrate (SPIRIVA RESPIMAT) 1.25 MCG/ACT AERS Inhale 2 puffs into the lungs daily. 4 g 3   No current facility-administered medications for this visit.   Allergies: Allergies  Allergen Reactions  . Doxycycline Rash  . Cyclobenzaprine   . Hydrocortisone     Breaks out in hives with topical cream.   .  Sulfamethoxazole-Trimethoprim Swelling  . Amoxicillin-Pot Clavulanate Nausea Only  . Codeine Rash    Other reaction(s): RASH   I reviewed her past medical history, social history, family history, and environmental history and no significant changes have been reported from her previous visit.  Review of Systems  Constitutional: Negative for appetite change, chills, fever and unexpected weight change.  HENT: Positive for congestion, postnasal drip and rhinorrhea.   Eyes: Positive for itching.  Respiratory: Positive for cough. Negative for chest tightness, shortness of breath and wheezing.   Gastrointestinal: Negative for abdominal pain.  Skin: Negative for rash.  Allergic/Immunologic: Positive for environmental allergies.  Neurological: Negative for headaches.   Objective: BP 124/60   Pulse 81   Temp (!) 94.6 F (34.8 C) (Temporal)   Resp 20   SpO2 99%  There is no height or weight on file to calculate BMI. Physical Exam Vitals and nursing note reviewed.  Constitutional:      Appearance: Normal appearance. She is well-developed.  HENT:     Head: Normocephalic and atraumatic.     Right Ear: Tympanic membrane and external ear normal.     Left Ear: Tympanic membrane and external ear normal.     Nose: Congestion and rhinorrhea present. No mucosal edema.     Mouth/Throat:     Mouth: Mucous membranes are moist.     Pharynx: Oropharynx is clear.  Eyes:     Conjunctiva/sclera: Conjunctivae normal.  Cardiovascular:     Rate and Rhythm: Normal rate and regular rhythm.     Heart sounds: Normal heart sounds. No murmur heard. No friction rub. No gallop.   Pulmonary:     Effort: Pulmonary effort is normal.     Breath sounds: Normal breath sounds. No wheezing, rhonchi or rales.  Musculoskeletal:     Cervical back: Neck supple.  Skin:    General: Skin is warm.     Findings: No rash.  Neurological:     Mental Status: She is alert and oriented to person, place, and time.   Psychiatric:        Behavior: Behavior normal.    Previous notes and tests were reviewed. The plan was reviewed with the patient/family, and all questions/concerned were addressed.  It was my pleasure to see Kristi Allen today and participate in her care. Please feel free to contact me with any questions or concerns.  Sincerely,  Wyline Mood, DO Allergy & Immunology  [facility] office: [phone] [facility] office: [phone]

## 2020-08-11 ENCOUNTER — Encounter: Payer: Self-pay | Admitting: Allergy

## 2020-08-11 ENCOUNTER — Ambulatory Visit (INDEPENDENT_AMBULATORY_CARE_PROVIDER_SITE_OTHER): Payer: Medicaid Other | Admitting: Allergy

## 2020-08-11 ENCOUNTER — Other Ambulatory Visit: Payer: Self-pay

## 2020-08-11 VITALS — BP 124/60 | HR 81 | Temp 94.6°F | Resp 20

## 2020-08-11 DIAGNOSIS — Z7185 Encounter for immunization safety counseling: Secondary | ICD-10-CM

## 2020-08-11 DIAGNOSIS — D839 Common variable immunodeficiency, unspecified: Secondary | ICD-10-CM | POA: Diagnosis not present

## 2020-08-11 DIAGNOSIS — J449 Chronic obstructive pulmonary disease, unspecified: Secondary | ICD-10-CM | POA: Diagnosis not present

## 2020-08-11 DIAGNOSIS — J3089 Other allergic rhinitis: Secondary | ICD-10-CM | POA: Diagnosis not present

## 2020-08-11 DIAGNOSIS — J302 Other seasonal allergic rhinitis: Secondary | ICD-10-CM

## 2020-08-11 DIAGNOSIS — K219 Gastro-esophageal reflux disease without esophagitis: Secondary | ICD-10-CM

## 2020-08-11 DIAGNOSIS — Z720 Tobacco use: Secondary | ICD-10-CM

## 2020-08-11 NOTE — Assessment & Plan Note (Addendum)
   Discussed risks and benefits.   Get COVID-19 vaccine 2 weeks after the last prednisone was taken (after April 18th)  Call the [facility] office and make appointment to get the COVID-19 vaccine.

## 2020-08-11 NOTE — Assessment & Plan Note (Signed)
Past history - 2021 bloodwork positive to cat, dog, grass, trees, ragweed.  Interim history - stable.  Take loratadine 10mg  daily in the morning.   Continue with Singulair (montelukast) 10mg  daily at night.  Use Fluticasone 1 spray per nostril twice a day for nasal congestion.  Use azelastine 1-2 sprays per nostril twice a day for drainage.  Nasal saline spray (i.e., Simply Saline) or nasal saline lavage (i.e., NeilMed) is recommended as needed and prior to medicated nasal sprays.  Continue environmental control measures.  No allergy immunotherapy until patient's asthma is under better control.

## 2020-08-11 NOTE — Assessment & Plan Note (Signed)
Past history - Patient used to follow with pulmonology as well. Had bronchiectasis on CT chest and failed to follow up. Still smoking 1.5 pack per day which is less than her previous of 3 packs per day. Using Symbicort 160 2 puffs every few hours and albuterol on a daily basis due to coughing, wheezing and shortness of breath. Normal alpha-1 level. Eosinophils 100, IgE 458. Interim history - improved with recent prednisone taper. Still smoking.  Discussed smoking cessation and strongly encouraged to stop smoking.  May use Mucinex twice a day for chest congestion/mucous production. Take with plenty of water.  Samples given.  READ about Xolair injections and Tezspire injections.  Start PA process.   START budesonide 0.5mg  nebulizer twice a day for the next 1 week.   Daily controller medication(s):continue Symbicort 2 puffs twice a day with spacer and rinse mouth afterwards.  Continue Spiriva 1.78mcg 2 puffs once a day.  During upper respiratory infections/asthma flares: start budesonide nebulizer for 1-2 weeks until your breathing symptoms return to baseline.   Get spirometry at next visit.

## 2020-08-11 NOTE — Assessment & Plan Note (Signed)
   Continue omeprazole 20mg twice a day.  Continue lifestyle and dietary modifications. 

## 2020-08-11 NOTE — Assessment & Plan Note (Signed)
Past history - Patient was diagnosed with CVID prior to coming to our office and has been on weekly Hizentra 7gm injections with good benefit. She mentions skipping few doses at times due to inability to self-administer due to her coughing fits. Patient had bronchiectasis on CT chest in past. Still smoking 1.5 pack per day.  Interim history - no Hizentra infusions for the past 2-3 months and now patient doesn't remember how to set it up. IgG down to 577 and just finished antibiotics for URI.  Discussed with patient at length that she needs IgG replacement therapy and wants to know if she can switch to IV infusions as then someone else would administer for her as she has difficulty with self-administration of the Hizentra. Risks and benefits discussed in depth.  Start IVIG infusions and stop Hizentra for now.   Keep track of infections.  And if you have another infection then let us know.   Recommend COVID-19 vaccination.  Get bloodwork every 3 months to closely monitor IgG values with new infusion method.

## 2020-08-11 NOTE — Patient Instructions (Addendum)
CVID (common variable immunodeficiency) (HCC)  Will switch you to IV infusions for the IgG.  Stop Hizentra for now  Keep track of infections.  Get bloodwork every 3 months.   Asthma-COPD overlap syndrome   STOP SMOKING  May use Mucinex twice a day for the mucous. Take with plenty of water.  Samples given but this is over the counter that you can buy without a prescription.   READ about Xolair injections and Tezspire injections.  START budesonide 0.5mg  nebulizer twice a day for the next 1 week.    Daily controller medication(s):continue Symbicort 2 puffs twice a day with spacer and rinse mouth afterwards.  Continue Spiriva 1.36mcg 2 puffs once a day.  During upper respiratory infections/asthma flares: start budesonide nebulizer for 1-2 weeks until your breathing symptoms return to baseline.   Seasonal and perennial allergic rhinitis  2021 bloodwork positive to cat, dog, grass, trees, ragweed.   Take loratadine 10mg  daily in the morning.   Continue with Singulair (montelukast) 10mg  daily at night.  Use Fluticasone 1 spray per nostril twice a day for nasal congestion.  Use azelastine 1-2 sprays per nostril twice a day for drainage.  Nasal saline spray (i.e., Simply Saline) or nasal saline lavage (i.e., NeilMed) is recommended as needed and prior to medicated nasal sprays.  Continue environmental control measures.  Gastroesophageal reflux disease  Continue omeprazole twice a day.   COVID vaccine  Get COVID-19 vaccine 2 weeks after the last prednisone was taken (after April 18th)  Call the [facility] office and make appointment to get the COVID-19 vaccine.   Follow up in 2 months or sooner if needed.

## 2020-08-16 ENCOUNTER — Telehealth: Payer: Self-pay | Admitting: *Deleted

## 2020-08-16 NOTE — Telephone Encounter (Signed)
Called patient and advised denial for Tezspire- not on formulary but have approval for Xolair 300mg  every 14 days for her asthma and will submit to pharmacy and reach out to her once delivery set.  Also we are going to change her from Hizentra to Privigen 10%- 30 grams every 28 days to be infused by nurse in her home.

## 2020-08-16 NOTE — Telephone Encounter (Signed)
-----   Message from Ellamae Sia, DO sent at 08/11/2020 12:45 PM EDT ----- Babette Relic - 2 things. 1. We are switching her over from Hizentra to IVIG as patient has difficulty doing the self-infusion at home. Can you check which IVIG product her insurance covers. 2. Can you check if Anola Gurney would be approved for her asthma? If not we will do Xolair 300mg  every 2 weeks.

## 2020-08-17 ENCOUNTER — Telehealth: Payer: Self-pay | Admitting: *Deleted

## 2020-08-17 MED ORDER — TEZSPIRE 210 MG/1.91ML ~~LOC~~ SOSY: 210.0000 mg | PREFILLED_SYRINGE | Freq: Once | SUBCUTANEOUS | 11 refills | Status: AC

## 2020-08-17 NOTE — Telephone Encounter (Signed)
Went online to get approval and received call from [facility] stating that no PA needed covered under medical

## 2020-08-18 ENCOUNTER — Other Ambulatory Visit: Payer: Self-pay | Admitting: Allergy

## 2020-09-02 ENCOUNTER — Telehealth: Payer: Self-pay | Admitting: Family Medicine

## 2020-09-02 NOTE — Telephone Encounter (Signed)
Pt requesting tubing for nebulizer.

## 2020-09-02 NOTE — Telephone Encounter (Signed)
Spoke with Preslea and told her we will leave 2 packages of tubing for her  nebulizer at the front desk for pick up. [employer] couldn't send out nebulizer tubing till July per pt.

## 2020-09-10 ENCOUNTER — Other Ambulatory Visit: Payer: Self-pay | Admitting: Allergy

## 2020-09-14 ENCOUNTER — Telehealth: Payer: Self-pay | Admitting: *Deleted

## 2020-09-14 NOTE — Telephone Encounter (Signed)
Called patient and and inquired if patient has started on her IVIG home admin and is doing well with her first infusion done in home and next due 5/20. Also advised we have her Xolair in [facility] and scheduled for 5/13 to start therapy

## 2020-09-16 ENCOUNTER — Ambulatory Visit (INDEPENDENT_AMBULATORY_CARE_PROVIDER_SITE_OTHER): Payer: Medicaid Other

## 2020-09-16 ENCOUNTER — Other Ambulatory Visit: Payer: Self-pay

## 2020-09-16 DIAGNOSIS — J4540 Moderate persistent asthma, uncomplicated: Secondary | ICD-10-CM | POA: Diagnosis not present

## 2020-09-16 MED ORDER — EPINEPHRINE 0.3 MG/0.3ML IJ SOAJ: INTRAMUSCULAR | 3 refills | Status: DC

## 2020-09-16 MED ORDER — OMALIZUMAB 150 MG ~~LOC~~ SOLR
300.0000 mg | SUBCUTANEOUS | Status: DC
  Administered 2020-09-16 – 2021-05-09 (×17): 300 mg via SUBCUTANEOUS

## 2020-09-16 NOTE — Progress Notes (Signed)
Immunotherapy   Patient Details  Name: Kristi Allen MRN: [id number] Date of Birth: Apr 02, 1956  09/16/2020  Kristi Allen started xolair 300mg  q 2 wks Following schedule:   Frequency:q 2 weeks Epi-Pen:Epi-Pen Available  Consent signed and patient instructions given.   09/16/2020, 1:48 PM

## 2020-09-17 ENCOUNTER — Ambulatory Visit: Payer: Medicaid Other

## 2020-09-30 ENCOUNTER — Other Ambulatory Visit: Payer: Self-pay

## 2020-09-30 ENCOUNTER — Ambulatory Visit (INDEPENDENT_AMBULATORY_CARE_PROVIDER_SITE_OTHER): Payer: Medicaid Other

## 2020-09-30 DIAGNOSIS — J4540 Moderate persistent asthma, uncomplicated: Secondary | ICD-10-CM | POA: Diagnosis not present

## 2020-10-01 ENCOUNTER — Other Ambulatory Visit: Payer: Self-pay | Admitting: Family Medicine

## 2020-10-05 ENCOUNTER — Other Ambulatory Visit: Payer: Self-pay | Admitting: Family Medicine

## 2020-10-07 ENCOUNTER — Other Ambulatory Visit: Payer: Self-pay | Admitting: Allergy

## 2020-10-13 ENCOUNTER — Encounter: Payer: Self-pay | Admitting: Allergy

## 2020-10-13 ENCOUNTER — Ambulatory Visit (INDEPENDENT_AMBULATORY_CARE_PROVIDER_SITE_OTHER): Payer: Medicaid Other | Admitting: Allergy

## 2020-10-13 ENCOUNTER — Other Ambulatory Visit: Payer: Self-pay

## 2020-10-13 DIAGNOSIS — J0100 Acute maxillary sinusitis, unspecified: Secondary | ICD-10-CM

## 2020-10-13 DIAGNOSIS — J4541 Moderate persistent asthma with (acute) exacerbation: Secondary | ICD-10-CM

## 2020-10-13 DIAGNOSIS — D839 Common variable immunodeficiency, unspecified: Secondary | ICD-10-CM

## 2020-10-13 DIAGNOSIS — J449 Chronic obstructive pulmonary disease, unspecified: Secondary | ICD-10-CM

## 2020-10-13 DIAGNOSIS — J3089 Other allergic rhinitis: Secondary | ICD-10-CM

## 2020-10-13 DIAGNOSIS — Z720 Tobacco use: Secondary | ICD-10-CM

## 2020-10-13 DIAGNOSIS — K219 Gastro-esophageal reflux disease without esophagitis: Secondary | ICD-10-CM

## 2020-10-13 DIAGNOSIS — J302 Other seasonal allergic rhinitis: Secondary | ICD-10-CM

## 2020-10-13 MED ORDER — SPIRIVA RESPIMAT 1.25 MCG/ACT IN AERS: 2.0000 | INHALATION_SPRAY | Freq: Every day | RESPIRATORY_TRACT | 5 refills | Status: DC

## 2020-10-13 MED ORDER — MOXIFLOXACIN HCL 400 MG PO TABS: 400.0000 mg | ORAL_TABLET | Freq: Every day | ORAL | 0 refills | Status: AC

## 2020-10-13 MED ORDER — PREDNISONE 10 MG PO TABS: ORAL_TABLET | ORAL | 0 refills | Status: AC

## 2020-10-13 MED ORDER — FLUCONAZOLE 150 MG PO TABS: ORAL_TABLET | ORAL | 0 refills | Status: DC

## 2020-10-13 NOTE — Progress Notes (Signed)
RE: Kristi Allen MRN: [id number] DOB: 1955/07/25 Date of Telemedicine Visit: 10/13/2020  Referring provider: List, Nash Shearer, FNP Primary care provider: List, Nash Shearer, FNP  Chief Complaint: Cough, Wheezing, Headache, and Nasal Congestion (Coughing up yellow phelgm, pt. With chills at times)   Telemedicine Follow Up Visit via Telephone: I connected with Kristi Allen for a follow up on 10/13/20 by telephone and verified that I am speaking with the correct person using two identifiers.   I discussed the limitations, risks, security and privacy concerns of performing an evaluation and management service by telephone and the availability of in person appointments. I also discussed with the patient that there may be a patient responsible charge related to this service. The patient expressed understanding and agreed to proceed.  Patient is at home.  Provider is at the office.  Visit start time: 1512 Visit end time: 1538 Insurance consent/check in by: [employer]   Medical consent and medical assistant/nurse: Marcelino Duster  History of Present Illness: She is a 65 y.o. female, who is being followed for asthma with COPD overlap, allergic rhinoconjunctivitis, CVID on immunoglobulin replacement, reflux. Her previous allergy office visit was on 08/11/2020 with Dr. Selena Batten.   She states she has been feeling bad for the past 2 weeks with a productive cough of thick yellow phlegm as well as headache, congestion, sneezing, facial pressure in her cheeks and forehead, chills.  She states yesterday she had an episode of emesis as well.  She states she was around her and her husband prior to the onset of symptoms about 2 weeks ago who were sick at the time.  She states they both had negative COVID test.  She states they got better after taking antibiotics.  She has been taking green tea with gin in it and using lemon and honey lozenges.  She has been using Symbicort and added on her Pulmicort via nebulizer due to the illness.   She does not have Spiriva as she states it has not been delivered to her with her other medications.  She continues to take her Claritin and Singulair.  She has Flonase to use for nasal congestion.  She did not get restarted on her immunoglobulin replacement therapy of Gammaplex and she states she has had 2 infusions thus far with her next scheduled on June 22.  She did have Z-Pak and prednisone back in March 2022.  She states Z-Pak's have not really been that helpful when she is having sinus type infection and/or bronchitis.  She states the prednisone however does help with her symptoms.  Assessment and Plan: Kiandria is a 65 y.o. female with:   Sinusitis, acute  Symptoms appear consistent with acute sinusitis and will treat with antibiotic Avelox 400mg  1 tablet for 5 days.   She has been counseled on rare but potential side effects related to use  Take prednisone 2 tab twice a day day 1 and day 2, then 1 tab twice a day 3 and 4, and then 1 tab on day 5 and stop  Monitor for fevers  Maintain adequate hydration  If symptoms worsening then go to urgent care or ED  CVID (common variable immunodeficiency) (HCC)  Continue Gammaplex infusion  Recommend IgG check after 4-5 infusions to ensure adequate dosing  Keep track of infections.  Asthma-COPD overlap syndrome   STOP SMOKING if you haven't already  May use Mucinex twice a day for the mucous. Take with plenty of water.  Samples given but this is over the counter that you  can buy without a prescription.   continue Xolair injections next week when feeling better   Daily controller medication(s):continue Symbicort 2 puffs twice a day with spacer and rinse mouth afterwards.  Resume Spiriva 1.66mcg 2 puffs once a day.  During upper respiratory infections/asthma flares: start budesonide nebulizer for 1-2 weeks until your breathing symptoms return to baseline.   Seasonal and perennial allergic rhinitis  2021 bloodwork  positive to cat, dog, grass, trees, ragweed.   Take loratadine 10mg  daily in the morning.   Continue with Singulair (montelukast) 10mg  daily at night.  Use Fluticasone 1 spray per nostril twice a day for nasal congestion.  Use azelastine 1-2 sprays per nostril twice a day for drainage.  Nasal saline spray (i.e., Simply Saline) or nasal saline lavage (i.e., NeilMed) is recommended as needed and prior to medicated nasal sprays.  Continue environmental control measures.  Gastroesophageal reflux disease  Continue omeprazole twice a day.   COVID vaccine  Call the [facility] office and make appointment to get the COVID-19 vaccine if you have not received yet  Follow up in 2 months or sooner if needed.    Diagnostics: None.  Medication List:  Current Outpatient Medications  Medication Sig Dispense Refill  . albuterol (PROVENTIL) (2.5 MG/3ML) 0.083% nebulizer solution Take 3 mLs (2.5 mg total) by nebulization every 6 (six) hours as needed for wheezing or shortness of breath. 75 mL 1  . amLODipine (NORVASC) 10 MG tablet Take 10 mg by mouth daily.    atorvastatin (LIPITOR) 10 MG tablet Take 10 mg by mouth daily.    . Azelastine HCl 0.15 % SOLN Place 1-2 sprays into the nose in the morning and at bedtime. For drainage 30 mL 2  . budesonide (PULMICORT) 0.5 MG/2ML nebulizer solution USE ONE VIAL IN NEBULIZER IN THE MORNING AND AT BEDTIME ONLY AS NEEDED 120 mL 5  . Calcium Carb-Cholecalciferol (CALCIUM CARBONATE-VITAMIN D3) 600-400 MG-UNIT TABS TAKE ONE TABLET BY MOUTH 2 TIMES A DAY    . Cholecalciferol (VITAMIN D3) 50 MCG (2000 UT) TABS Take by mouth.    . EPINEPHrine (EPIPEN 2-PAK) 0.3 mg/0.3 mL IJ SOAJ injection Use as needed for severe allergic reactions. 2 each 3  . fluconazole (DIFLUCAN) 150 MG tablet Take 1 tablet at start and end of Avelox course second 2 tablet 0  . fluticasone (FLONASE) 50 MCG/ACT nasal spray Place 1 spray into both nostrils in the morning and at bedtime. For  drainage 16 g 2  . hydrochlorothiazide (HYDRODIURIL) 25 MG tablet Take 25 mg by mouth daily.    [facility] ipratropium-albuterol (DUONEB) 0.5-2.5 (3) MG/3ML SOLN Take 3 mLs by nebulization every 6 (six) hours as needed. 360 mL 5  . levocetirizine (XYZAL) 5 MG tablet TAKE ONE TABLET BY MOUTH DAILY AS NEEDED FOR ALLERGIES 30 tablet 5  . loratadine (CLARITIN) 10 MG tablet Take 1 tablet (10 mg total) by mouth daily. 30 tablet 5  . losartan (COZAAR) 50 MG tablet Take 50 mg by mouth daily.    . montelukast (SINGULAIR) 10 MG tablet TAKE 1 TABLET BY MOUTH EACH NIGHT AT BEDTIME 30 tablet 0  . moxifloxacin (AVELOX) 400 MG tablet Take 1 tablet (400 mg total) by mouth daily at 8 pm for 5 days. 5 tablet 0  . Olopatadine HCl (PATADAY) 0.2 % SOLN Place 1 drop into both eyes daily as needed. 2.5 mL 5  . omeprazole (PRILOSEC) 20 MG capsule Take 20 mg by mouth 2 (two) times daily.    Marland Kitchen  predniSONE (DELTASONE) 10 MG tablet Take 2 tablets (20 mg total) by mouth 2 (two) times daily with a meal for 2 days, THEN 2 tablets (20 mg total) daily with breakfast for 2 days, THEN 1 tablet (10 mg total) daily with breakfast for 1 day. 13 tablet 0  . PROAIR HFA 108 (90 Base) MCG/ACT inhaler INHALE TWO PUFFS EVERY 4 HOURS AS NEEDED FOR WHEEZING OR SHORTNESS OF BREATH OR coughing fits 8.5 g 1  . Respiratory Therapy Supplies (FLUTTER) DEVI 1 Device by Does not apply route as directed. 1 each 0  . SYMBICORT 160-4.5 MCG/ACT inhaler INHALE TWO PUFFS BY MOUTH EVERY MORNING AND AT BEDTIME. USE WITH SPACER AND RINSE MOUTH AFTERWARDS 10.2 g 5  . vitamin C (ASCORBIC ACID) 500 MG tablet Take 500 mg by mouth daily.    . Zinc 50 MG CAPS Take by mouth.    . Tiotropium Bromide Monohydrate (SPIRIVA RESPIMAT) 1.25 MCG/ACT AERS Inhale 2 puffs into the lungs daily. 4 g 5   Current Facility-Administered Medications  Medication Dose Route Frequency Provider Last Rate Last Admin  . omalizumab Geoffry Paradise) injection 300 mg  300 mg Subcutaneous Q14 Days Alfonse Spruce, MD   300 mg at 09/30/20 1409   Allergies: Allergies  Allergen Reactions  . Doxycycline Rash  . Cyclobenzaprine   . Hydrocortisone     Breaks out in hives with topical cream.   . Sulfamethoxazole-Trimethoprim Swelling  . Amoxicillin-Pot Clavulanate Nausea Only  . Codeine Rash    Other reaction(s): RASH   I reviewed her past medical history, social history, family history, and environmental history and no significant changes have been reported from previous visit on 08/11/2020.  Review of Systems  Constitutional: Positive for chills and fatigue.  HENT: Positive for congestion, sinus pressure and sinus pain.   Eyes: Negative.   Respiratory: Positive for cough and shortness of breath.   Cardiovascular: Negative.   Gastrointestinal: Positive for nausea and vomiting.  Musculoskeletal: Positive for arthralgias.  Skin: Negative.   Neurological: Positive for headaches.   Objective: Physical Exam Not obtained as encounter was done via telephone.   Previous notes and tests were reviewed.  I discussed the assessment and treatment plan with the patient. The patient was provided an opportunity to ask questions and all were answered. The patient agreed with the plan and demonstrated an understanding of the instructions.   The patient was advised to call back or seek an in-person evaluation if the symptoms worsen or if the condition fails to improve as anticipated.  I provided 26 minutes of non-face-to-face time during this encounter.  It was my pleasure to participate in Reah Jollie's care today. Please feel free to contact me with any questions or concerns.   Sincerely,  Nichole Neyer Larose Hires, MD

## 2020-10-13 NOTE — Patient Instructions (Addendum)
Sinusitis, acute  Symptoms appear consistent with acute sinusitis and will treat with antibiotic Avelox 400mg  1 tablet for 5 days.   She has been counseled on rare but potential side effects related to use  Take prednisone 2 tab twice a day day 1 and day 2, then 1 tab twice a day 3 and 4, and then 1 tab on day 5 and stop  Monitor for fevers  Maintain adequate hydration  If symptoms worsening then go to urgent care or ED  CVID (common variable immunodeficiency) (HCC)  Continue Gammaplex infusion  Recommend IgG check after 4-5 infusions to ensure adequate dosing  Keep track of infections.  Asthma-COPD overlap syndrome   STOP SMOKING if you haven't already  May use Mucinex twice a day for the mucous. Take with plenty of water.  Samples given but this is over the counter that you can buy without a prescription.   continue Xolair injections next week when feeling better   Daily controller medication(s):continue Symbicort June 2 puffs twice a day with spacer and rinse mouth afterwards.  Resume Spiriva 1.36mcg 2 puffs once a day.  During upper respiratory infections/asthma flares: start budesonide nebulizer for 1-2 weeks until your breathing symptoms return to baseline.   Seasonal and perennial allergic rhinitis  2021 bloodwork positive to cat, dog, grass, trees, ragweed.   Take loratadine 10mg  daily in the morning.   Continue with Singulair (montelukast) 10mg  daily at night.  Use Fluticasone 1 spray per nostril twice a day for nasal congestion.  Use azelastine 1-2 sprays per nostril twice a day for drainage.  Nasal saline spray (i.e., Simply Saline) or nasal saline lavage (i.e., NeilMed) is recommended as needed and prior to medicated nasal sprays.  Continue environmental control measures.  Gastroesophageal reflux disease  Continue omeprazole twice a day.   COVID vaccine  Call the [facility] office and make appointment to get the COVID-19 vaccine if you have  not received yet  Follow up in 2 months or sooner if needed.

## 2020-10-14 ENCOUNTER — Ambulatory Visit: Payer: Self-pay

## 2020-10-20 ENCOUNTER — Ambulatory Visit: Payer: Self-pay

## 2020-10-26 ENCOUNTER — Telehealth: Payer: Self-pay

## 2020-10-26 ENCOUNTER — Ambulatory Visit: Payer: Self-pay

## 2020-10-26 NOTE — Telephone Encounter (Signed)
Patient was due for a Xolair injection today but didn't come in due to asthma sxs. She called and spoke with Lavonna Rua and wanted a nurse to call her back to talk about her asthma. Left message for patient to call office back tomorrow.

## 2020-11-02 NOTE — Progress Notes (Signed)
Follow Up Note  RE: Kristi Allen MRN: [id number] DOB: 05/26/1955 Date of Office Visit: 11/03/2020  Referring provider: List, Nash Shearer, FNP Primary care provider: List, Nash Shearer, FNP  Chief Complaint: Asthma, Wheezing (/), Cough, and Nasal Congestion (With PND)  History of Present Illness: I had the pleasure of seeing Kristi Allen for a follow up visit at the [facility] on 11/03/2020. She is a 65 y.o. 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 Her previous allergy office visit was on 10/13/2020 with Dr. Delorse Lek via telemedicine. Today is a regular follow up visit.  Sinusitis Finished prednisone and Moxifloxacin with unknown benefit.   CVID (common variable immunodeficiency)  Patient had 4 IVIG infusions at home and doing well on it. No additional antibiotics then the above.   Asthma-COPD overlap syndrome Act score 7. Started Xolair in May and it did seem to be help but now overdue. Currently on Symbicort 2 puffs twice a day and Spiriva 1.27mcg 2 puffs once a day. Started using albuterol nebulizer every 3-4 hours for the past few weeks with good benefit.  Still smoking 1.5 ppd   Seasonal and perennial allergic rhinitis Some nasal congestion and rhinorrhea - seems like the xolair helped these symptoms as well.  No fevers/chill. Using saline rinse twice a day, Flonase 2 sprays once a day with some benefit. No nosebleeds. Taking loratadine and Singulair daily.    Gastroesophageal reflux disease Stable with omeprazole twice a day.   Vaccine counseling Concerned about possible side effects.  Assessment and Plan: Ruble is a 65 y.o. female with: CVID (common variable immunodeficiency) (HCC) Past history - Patient was diagnosed with CVID prior to coming to our office and has been on weekly Hizentra 7gm injections with good benefit. She mentions skipping few doses  at times due to inability to self-administer due to her coughing fits. Patient had bronchiectasis on CT chest in past. Still smoking 1.5 pack per day.  Interim history - doing much better with monthly IVIG infusions.  Continue at home infusions (IVIG) once a month.  Keep track of infections. Get bloodwork every 3 months - ask your infusion nurse if they can draw it before giving the infusion. If not possible then get it drawn a few days before you are due for the infusion to get through levels.  Asthma-COPD overlap syndrome (HCC) Past history - Patient used to follow with pulmonology as well. Had bronchiectasis on CT chest and failed to follow up. Still smoking 1.5 pack per day which is less than her previous of 3 packs per day.Normal alpha-1 level. Eosinophils 100, IgE 458. Interim history - unchanged and still smoking. She thinks the Xolair helped and is overdue.  Act score 7. Today's spirometry showed restriction. Discussed smoking cessation and strongly encouraged to stop smoking. Use flutter valve. Continue budesonide nebulizer for 1-2 weeks until your breathing symptoms return to baseline.  Daily controller medication(s): continue Symbicort 2 puffs twice a day with spacer and rinse mouth afterwards. Continue Spiriva 1.38mcg 2 puffs once a day. Continue Xolair 300mg  injections every 2 weeks - given today.   During upper respiratory infections/asthma flares: start budesonide nebulizer for 1-2 weeks until your breathing symptoms return to baseline.  May use albuterol rescue inhaler 2 puffs every 4 to 6 hours as needed for shortness of breath, chest tightness, coughing, and wheezing. May use albuterol rescue inhaler 2 puffs 5 to 15 minutes  prior to strenuous physical activities. Monitor frequency of use.  Get spirometry at next visit.  Seasonal and perennial allergic rhinitis Past history - 2021 bloodwork positive to cat, dog, grass, trees, ragweed.  Interim history - increased  symptoms. Take loratadine 10mg  daily in the morning.  Continue with Singulair (montelukast) 10mg  daily at night. Use Flonase (fluticasone) nasal spray 1 spray per nostril twice a day as needed for nasal congestion.  Use azelastine nasal spray 1-2 sprays per nostril twice a day as needed for runny nose/drainage. Nasal saline spray (i.e., Simply Saline) or nasal saline lavage (i.e., NeilMed) is recommended as needed and prior to medicated nasal sprays. Continue environmental control measures.  Gastroesophageal reflux disease Continue omeprazole 20mg  twice a day.  Continue lifestyle and dietary modifications.  Vaccine counseling Discussed risks and benefits.  Call the [facility] office and make appointment to get the COVID-19 vaccine.   Return in about 3 months (around 02/03/2021).  Meds ordered this encounter  Medications   azelastine (ASTELIN) 0.1 % nasal spray    Sig: Place 1-2 sprays into both nostrils 2 (two) times daily as needed (drainage). Use in each nostril as directed    Dispense:  30 mL    Refill:  5    Lab Orders  CBC with Differential/Platelet  Comprehensive metabolic panel  IgG, IgA, IgM  Sedimentation rate    Diagnostics: Spirometry:  Tracings reviewed. Her effort: It was hard to get consistent efforts and there is a question as to whether this reflects a maximal maneuver. FVC: 1.87L FEV1: 1.44L, 65% predicted FEV1/FVC ratio: 77% Interpretation: Spirometry consistent with possible restrictive disease.  Please see scanned spirometry results for details.  Medication List:  Current Outpatient Medications  Medication Sig Dispense Refill   amLODipine (NORVASC) 10 MG tablet Take 10 mg by mouth daily.     atorvastatin (LIPITOR) 10 MG tablet Take 10 mg by mouth daily.     azelastine (ASTELIN) 0.1 % nasal spray Place 1-2 sprays into both nostrils 2 (two) times daily as needed (drainage). Use in each nostril as directed 30 mL 5   budesonide (PULMICORT) 0.5 MG/2ML  nebulizer solution USE ONE VIAL IN NEBULIZER IN THE MORNING AND AT BEDTIME ONLY AS NEEDED 120 mL 5   Calcium Carb-Cholecalciferol (CALCIUM CARBONATE-VITAMIN D3) 600-400 MG-UNIT TABS TAKE ONE TABLET BY MOUTH 2 TIMES A DAY     Cholecalciferol (VITAMIN D3) 50 MCG (2000 UT) TABS Take by mouth.     EPINEPHrine (EPIPEN 2-PAK) 0.3 mg/0.3 mL IJ SOAJ injection Use as needed for severe allergic reactions. 2 each 3   fluticasone (FLONASE) 50 MCG/ACT nasal spray Place 1 spray into both nostrils in the morning and at bedtime. For drainage 16 g 2   hydrochlorothiazide (HYDRODIURIL) 25 MG tablet Take 25 mg by mouth daily.     ipratropium-albuterol (DUONEB) 0.5-2.5 (3) MG/3ML SOLN Take 3 mLs by nebulization every 6 (six) hours as needed. 360 mL 5   levocetirizine (XYZAL) 5 MG tablet TAKE ONE TABLET BY MOUTH DAILY AS NEEDED FOR ALLERGIES 30 tablet 5   loratadine (CLARITIN) 10 MG tablet Take 1 tablet (10 mg total) by mouth daily. 30 tablet 5   losartan (COZAAR) 50 MG tablet Take 50 mg by mouth daily.     montelukast (SINGULAIR) 10 MG tablet TAKE 1 TABLET BY MOUTH EACH NIGHT AT BEDTIME 30 tablet 0   Olopatadine HCl (PATADAY) 0.2 % SOLN Place 1 drop into both eyes daily as needed. 2.5 mL 5   omeprazole (  PRILOSEC) 20 MG capsule Take 20 mg by mouth 2 (two) times daily.     PROAIR HFA 108 (90 Base) MCG/ACT inhaler INHALE TWO PUFFS EVERY 4 HOURS AS NEEDED FOR WHEEZING OR SHORTNESS OF BREATH OR coughing fits 8.5 g 1   Respiratory Therapy Supplies (FLUTTER) DEVI 1 Device by Does not apply route as directed. 1 each 0   SYMBICORT 160-4.5 MCG/ACT inhaler INHALE TWO PUFFS BY MOUTH EVERY MORNING AND AT BEDTIME. USE WITH SPACER AND RINSE MOUTH AFTERWARDS 10.2 g 5   Tiotropium Bromide Monohydrate (SPIRIVA RESPIMAT) 1.25 MCG/ACT AERS Inhale 2 puffs into the lungs daily. 4 g 5   vitamin C (ASCORBIC ACID) 500 MG tablet Take 500 mg by mouth daily.     Zinc 50 MG CAPS Take by mouth.     albuterol (PROVENTIL) (2.5 MG/3ML) 0.083%  nebulizer solution Take 3 mLs (2.5 mg total) by nebulization every 6 (six) hours as needed for wheezing or shortness of breath. (Patient not taking: Reported on 11/03/2020) 75 mL 1   fluconazole (DIFLUCAN) 150 MG tablet Take 1 tablet at start and end of Avelox course second (Patient not taking: Reported on 11/03/2020) 2 tablet 0   Current Facility-Administered Medications  Medication Dose Route Frequency Provider Last Rate Last Admin   omalizumab Geoffry Paradise(XOLAIR) injection 300 mg  300 mg Subcutaneous Q14 Days Alfonse SpruceGallagher, Joel Louis, MD   300 mg at 11/03/20 0400   Allergies: Allergies  Allergen Reactions   Doxycycline Rash   Cyclobenzaprine    Hydrocortisone     Breaks out in hives with topical cream.    Sulfamethoxazole-Trimethoprim Swelling   Amoxicillin-Pot Clavulanate Nausea Only   Codeine Rash    Other reaction(s): RASH   I reviewed her past medical history, social history, family history, and environmental history and no significant changes have been reported from her previous visit.  Review of Systems  Constitutional:  Negative for appetite change, chills, fever and unexpected weight change.  HENT:  Positive for congestion, postnasal drip and rhinorrhea.   Eyes:  Negative for itching.  Respiratory:  Positive for cough, chest tightness and shortness of breath.   Gastrointestinal:  Negative for abdominal pain.  Skin:  Negative for rash.  Allergic/Immunologic: Positive for environmental allergies.  Neurological:  Negative for headaches.   Objective: BP (!) 146/70 (BP Location: Left Arm, Patient Position: Sitting, Cuff Size: Normal)   Pulse 66   Temp 98.1 F (36.7 C) (Temporal)   Resp 16   SpO2 99%  There is no height or weight on file to calculate BMI. Physical Exam Vitals and nursing note reviewed.  Constitutional:      Appearance: Normal appearance. She is well-developed.  HENT:     Head: Normocephalic and atraumatic.     Right Ear: Tympanic membrane and external ear normal.      Left Ear: Tympanic membrane and external ear normal.     Nose: Congestion and rhinorrhea present. No mucosal edema.     Mouth/Throat:     Mouth: Mucous membranes are moist.     Pharynx: Oropharynx is clear.  Eyes:     Conjunctiva/sclera: Conjunctivae normal.  Cardiovascular:     Rate and Rhythm: Normal rate and regular rhythm.     Heart sounds: Normal heart sounds. No murmur heard.   No friction rub. No gallop.  Pulmonary:     Effort: Pulmonary effort is normal.     Breath sounds: Rhonchi present. No wheezing or rales.  Musculoskeletal:     Cervical back:  Neck supple.  Skin:    General: Skin is warm.     Findings: No rash.  Neurological:     Mental Status: She is alert and oriented to person, place, and time.  Psychiatric:        Behavior: Behavior normal.   Previous notes and tests were reviewed. The plan was reviewed with the patient/family, and all questions/concerned were addressed.  It was my pleasure to see Ashwika today and participate in her care. Please feel free to contact me with any questions or concerns.  Sincerely,  Wyline Mood, DO Allergy & Immunology  [facility] office: [phone] [facility] office: [phone]

## 2020-11-03 ENCOUNTER — Other Ambulatory Visit: Payer: Self-pay

## 2020-11-03 ENCOUNTER — Encounter: Payer: Self-pay | Admitting: Allergy

## 2020-11-03 ENCOUNTER — Ambulatory Visit (INDEPENDENT_AMBULATORY_CARE_PROVIDER_SITE_OTHER): Payer: Medicaid Other

## 2020-11-03 ENCOUNTER — Ambulatory Visit (INDEPENDENT_AMBULATORY_CARE_PROVIDER_SITE_OTHER): Payer: Medicaid Other | Admitting: Allergy

## 2020-11-03 VITALS — BP 146/70 | HR 66 | Temp 98.1°F | Resp 16

## 2020-11-03 DIAGNOSIS — Z7185 Encounter for immunization safety counseling: Secondary | ICD-10-CM

## 2020-11-03 DIAGNOSIS — J3089 Other allergic rhinitis: Secondary | ICD-10-CM

## 2020-11-03 DIAGNOSIS — D839 Common variable immunodeficiency, unspecified: Secondary | ICD-10-CM

## 2020-11-03 DIAGNOSIS — J4540 Moderate persistent asthma, uncomplicated: Secondary | ICD-10-CM

## 2020-11-03 DIAGNOSIS — J449 Chronic obstructive pulmonary disease, unspecified: Secondary | ICD-10-CM | POA: Diagnosis not present

## 2020-11-03 DIAGNOSIS — Z720 Tobacco use: Secondary | ICD-10-CM | POA: Diagnosis not present

## 2020-11-03 DIAGNOSIS — J4541 Moderate persistent asthma with (acute) exacerbation: Secondary | ICD-10-CM

## 2020-11-03 DIAGNOSIS — K219 Gastro-esophageal reflux disease without esophagitis: Secondary | ICD-10-CM

## 2020-11-03 DIAGNOSIS — J302 Other seasonal allergic rhinitis: Secondary | ICD-10-CM

## 2020-11-03 MED ORDER — AZELASTINE HCL 0.1 % NA SOLN: 1.0000 | Freq: Two times a day (BID) | NASAL | 5 refills | Status: DC | PRN

## 2020-11-03 NOTE — Assessment & Plan Note (Signed)
   Discussed risks and benefits.   Call the [facility] office and make appointment to get the COVID-19 vaccine.

## 2020-11-03 NOTE — Assessment & Plan Note (Signed)
Past history - Patient used to follow with pulmonology as well. Had bronchiectasis on CT chest and failed to follow up. Still smoking 1.5 pack per day which is less than her previous of 3 packs per day.Normal alpha-1 level. Eosinophils 100, IgE 458. Interim history - unchanged and still smoking. She thinks the Xolair helped and is overdue.   Act score 7.  Today's spirometry showed restriction.  Discussed smoking cessation and strongly encouraged to stop smoking.  Use flutter valve.  Continue budesonide nebulizer for 1-2 weeks until your breathing symptoms return to baseline.   Daily controller medication(s): continue Symbicort 2 puffs twice a day with spacer and rinse mouth afterwards.  Continue Spiriva 1.15mcg 2 puffs once a day.  Continue Xolair 300mg  injections every 2 weeks - given today.    During upper respiratory infections/asthma flares: start budesonide nebulizer for 1-2 weeks until your breathing symptoms return to baseline.  . May use albuterol rescue inhaler 2 puffs every 4 to 6 hours as needed for shortness of breath, chest tightness, coughing, and wheezing. May use albuterol rescue inhaler 2 puffs 5 to 15 minutes prior to strenuous physical activities. Monitor frequency of use.  . Get spirometry at next visit.

## 2020-11-03 NOTE — Patient Instructions (Addendum)
CVID (common variable immunodeficiency)  Continue at home infusions once a month.  Keep track of infections. Get bloodwork every 3 months - ask your infusion nurse if they can draw it before giving the infusion. If not possible then get it drawn a few days before you are due for the infusion.    Asthma-COPD overlap syndrome  STOP SMOKING Continue budesonide nebulizer for 1-2 weeks until your breathing symptoms return to baseline.  Daily controller medication(s): continue Symbicort 2 puffs twice a day with spacer and rinse mouth afterwards. Continue Spiriva 1.73mcg 2 puffs once a day. Continue Xolair 300mg  injections every 2 weeks - given today.   During upper respiratory infections/asthma flares: start budesonide nebulizer for 1-2 weeks until your breathing symptoms return to baseline.  May use albuterol rescue inhaler 2 puffs every 4 to 6 hours as needed for shortness of breath, chest tightness, coughing, and wheezing. May use albuterol rescue inhaler 2 puffs 5 to 15 minutes prior to strenuous physical activities. Monitor frequency of use.  Asthma control goals:  Full participation in all desired activities (may need albuterol before activity) Albuterol use two times or less a week on average (not counting use with activity) Cough interfering with sleep two times or less a month Oral steroids no more than once a year No hospitalizations   Seasonal and perennial allergic rhinitis 2021 bloodwork positive to cat, dog, grass, trees, ragweed.  Take loratadine 10mg  daily in the morning.  Continue with Singulair (montelukast) 10mg  daily at night. Use Flonase (fluticasone) nasal spray 1 spray per nostril twice a day as needed for nasal congestion.  Use azelastine nasal spray 1-2 sprays per nostril twice a day as needed for runny nose/drainage. Nasal saline spray (i.e., Simply Saline) or nasal saline lavage (i.e., NeilMed) is recommended as needed and prior to medicated nasal  sprays. Continue environmental control measures.   Gastroesophageal reflux disease Continue omeprazole 20mg  twice a day.   COVID vaccine Call the [facility] office and make appointment to get the COVID-19 vaccine.   Follow up in 3 months or sooner if needed.   After August 2022, I will not be in the [facility] office on a regular basis.  You can continue your care and follow up with Dr. in [facility]. OR you can follow up with me in our [place] ([address]) or [facility] [phone]) location.  Sincerely,  [facility], DO  [facility] office: [phone] [facility] office: [phone] [place] office: [phone]

## 2020-11-03 NOTE — Assessment & Plan Note (Signed)
Past history - 2021 bloodwork positive to cat, dog, grass, trees, ragweed.  Interim history - increased symptoms.  Take loratadine 10mg  daily in the morning.   Continue with Singulair (montelukast) 10mg  daily at night.  Use Flonase (fluticasone) nasal spray 1 spray per nostril twice a day as needed for nasal congestion.   Use azelastine nasal spray 1-2 sprays per nostril twice a day as needed for runny nose/drainage.  Nasal saline spray (i.e., Simply Saline) or nasal saline lavage (i.e., NeilMed) is recommended as needed and prior to medicated nasal sprays.  Continue environmental control measures.

## 2020-11-03 NOTE — Assessment & Plan Note (Signed)
Past history - Patient was diagnosed with CVID prior to coming to our office and has been on weekly Hizentra 7gm injections with good benefit. She mentions skipping few doses at times due to inability to self-administer due to her coughing fits. Patient had bronchiectasis on CT chest in past. Still smoking 1.5 pack per day.  Interim history - doing much better with monthly IVIG infusions.   Continue at home infusions (IVIG) once a month.   Keep track of infections.  Get bloodwork every 3 months - ask your infusion nurse if they can draw it before giving the infusion.  If not possible then get it drawn a few days before you are due for the infusion to get through levels.

## 2020-11-03 NOTE — Assessment & Plan Note (Signed)
   Continue omeprazole 20mg twice a day.  Continue lifestyle and dietary modifications. 

## 2020-11-12 ENCOUNTER — Other Ambulatory Visit: Payer: Self-pay | Admitting: Family Medicine

## 2020-11-12 ENCOUNTER — Other Ambulatory Visit: Payer: Self-pay | Admitting: Allergy

## 2020-11-17 ENCOUNTER — Ambulatory Visit (INDEPENDENT_AMBULATORY_CARE_PROVIDER_SITE_OTHER): Payer: Medicaid Other

## 2020-11-17 ENCOUNTER — Other Ambulatory Visit: Payer: Self-pay | Admitting: Family Medicine

## 2020-11-17 ENCOUNTER — Other Ambulatory Visit: Payer: Self-pay

## 2020-11-17 DIAGNOSIS — J4541 Moderate persistent asthma with (acute) exacerbation: Secondary | ICD-10-CM

## 2020-11-17 DIAGNOSIS — J4540 Moderate persistent asthma, uncomplicated: Secondary | ICD-10-CM

## 2020-11-19 ENCOUNTER — Telehealth: Payer: Self-pay

## 2020-11-19 NOTE — Telephone Encounter (Signed)
Spoke to [facility] regarding new tubing for her nebulizer that I had to send new orders to [place] because she was an inactive patient. New orders sent along with last office note. Have called twice to see if new orders rec'd. Turkey from [facility] will let me know if notes received. She will fax a cmn form for Dr. To sign. Will see if they will mail a form for Vera to sign or take a verbal over the phone to have her become and active Patient. Patient doesn't drive that far and will not be able to go by the [facility] office to sign paper work for her to become an active patient.

## 2020-11-23 LAB — COMPREHENSIVE METABOLIC PANEL
ALT: 13 IU/L (ref 0–32)
AST: 16 IU/L (ref 0–40)
Albumin/Globulin Ratio: 1.9 (ref 1.2–2.2)
Albumin: 4.4 g/dL (ref 3.8–4.8)
Alkaline Phosphatase: 110 IU/L (ref 44–121)
BUN/Creatinine Ratio: 15 (ref 12–28)
BUN: 11 mg/dL (ref 8–27)
Bilirubin Total: 0.4 mg/dL (ref 0.0–1.2)
CO2: 23 mmol/L (ref 20–29)
Calcium: 9.6 mg/dL (ref 8.7–10.3)
Chloride: 101 mmol/L (ref 96–106)
Creatinine, Ser: 0.75 mg/dL (ref 0.57–1.00)
Globulin, Total: 2.3 g/dL (ref 1.5–4.5)
Glucose: 102 mg/dL — ABNORMAL HIGH (ref 65–99)
Potassium: 4.4 mmol/L (ref 3.5–5.2)
Sodium: 141 mmol/L (ref 134–144)
Total Protein: 6.7 g/dL (ref 6.0–8.5)
eGFR: 89 mL/min/{1.73_m2} (ref >59)

## 2020-11-23 LAB — CBC WITH DIFFERENTIAL/PLATELET
Basophils Absolute: 0.1 10*3/uL (ref 0.0–0.2)
Basos: 1 %
EOS (ABSOLUTE): 0.1 10*3/uL (ref 0.0–0.4)
Eos: 1 %
Hematocrit: 42 % (ref 34.0–46.6)
Hemoglobin: 14.3 g/dL (ref 11.1–15.9)
Immature Grans (Abs): 0 10*3/uL (ref 0.0–0.1)
Immature Granulocytes: 0 %
Lymphocytes Absolute: 2.2 10*3/uL (ref 0.7–3.1)
Lymphs: 26 %
MCH: 30.2 pg (ref 26.6–33.0)
MCHC: 34 g/dL (ref 31.5–35.7)
MCV: 89 fL (ref 79–97)
Monocytes Absolute: 0.6 10*3/uL (ref 0.1–0.9)
Monocytes: 7 %
Neutrophils Absolute: 5.5 10*3/uL (ref 1.4–7.0)
Neutrophils: 65 %
Platelets: 320 10*3/uL (ref 150–450)
RBC: 4.74 x10E6/uL (ref 3.77–5.28)
RDW: 13 % (ref 11.7–15.4)
WBC: 8.5 10*3/uL (ref 3.4–10.8)

## 2020-11-23 LAB — IGG, IGA, IGM
IgA/Immunoglobulin A, Serum: 146 mg/dL (ref 87–352)
IgG (Immunoglobin G), Serum: 940 mg/dL (ref 586–1602)
IgM (Immunoglobulin M), Srm: 59 mg/dL (ref 26–217)

## 2020-11-23 LAB — SEDIMENTATION RATE: Sed Rate: 23 mm/hr (ref 0–40)

## 2020-11-23 NOTE — Telephone Encounter (Signed)
Mia from [employer] called and state that the pt is approved and is waiting on the paperwork that Dr. Selena Batten needs to sign.

## 2020-11-25 NOTE — Telephone Encounter (Signed)
Sent a message to the [facility] asking if there were any forms to be signed by Dr. Selena Batten for the patient.

## 2020-11-28 ENCOUNTER — Other Ambulatory Visit: Payer: Self-pay | Admitting: Family Medicine

## 2020-11-29 NOTE — Telephone Encounter (Signed)
Called [facility] spoke to Turkey she is going to refax the paper work for Dr. Selena Batten to sign for Kristi Allen to get new nebulizer supplies.

## 2020-12-01 ENCOUNTER — Ambulatory Visit: Payer: Self-pay

## 2020-12-01 NOTE — Telephone Encounter (Signed)
[facility] has received the fax'd paper work. Will get someone out to Kristi Allen's house no later than Friday at the latest to bring her nebulizer supplies and sign new paper work to be an active patient with [facility]. Pt. Aware.

## 2020-12-01 NOTE — Telephone Encounter (Signed)
Spoke to patient to let her know we received paper work that Dr. Selena Batten had signed dx code put on the paper work and fax'd back. Will follow up with [facility] to see if paper work was received and to see when the nebulizer supplies are going to be mailed out to patients home. Will contact patient to let her know when nebulizer supplies will be mailed out.

## 2020-12-08 ENCOUNTER — Ambulatory Visit (INDEPENDENT_AMBULATORY_CARE_PROVIDER_SITE_OTHER): Payer: Medicaid Other

## 2020-12-08 ENCOUNTER — Other Ambulatory Visit: Payer: Self-pay

## 2020-12-08 DIAGNOSIS — J4541 Moderate persistent asthma with (acute) exacerbation: Secondary | ICD-10-CM | POA: Diagnosis not present

## 2020-12-20 ENCOUNTER — Telehealth: Payer: Self-pay | Admitting: Allergy

## 2020-12-20 MED ORDER — LEVOCETIRIZINE DIHYDROCHLORIDE 5 MG PO TABS: ORAL_TABLET | ORAL | 5 refills | Status: DC

## 2020-12-20 NOTE — Telephone Encounter (Signed)
Pt states [facility] is waiting to hear from our office concerning her refill for levocetirizine.

## 2020-12-20 NOTE — Telephone Encounter (Signed)
[facility]

## 2020-12-22 ENCOUNTER — Ambulatory Visit: Payer: Self-pay

## 2020-12-22 ENCOUNTER — Other Ambulatory Visit: Payer: Self-pay | Admitting: Allergy

## 2020-12-23 ENCOUNTER — Other Ambulatory Visit: Payer: Self-pay

## 2020-12-23 ENCOUNTER — Ambulatory Visit (INDEPENDENT_AMBULATORY_CARE_PROVIDER_SITE_OTHER): Payer: Medicaid Other

## 2020-12-23 DIAGNOSIS — J4540 Moderate persistent asthma, uncomplicated: Secondary | ICD-10-CM

## 2020-12-23 DIAGNOSIS — J4541 Moderate persistent asthma with (acute) exacerbation: Secondary | ICD-10-CM

## 2021-01-05 ENCOUNTER — Ambulatory Visit (INDEPENDENT_AMBULATORY_CARE_PROVIDER_SITE_OTHER): Payer: Medicaid Other

## 2021-01-05 ENCOUNTER — Other Ambulatory Visit: Payer: Self-pay

## 2021-01-05 DIAGNOSIS — J4541 Moderate persistent asthma with (acute) exacerbation: Secondary | ICD-10-CM

## 2021-01-05 DIAGNOSIS — J4540 Moderate persistent asthma, uncomplicated: Secondary | ICD-10-CM

## 2021-01-06 ENCOUNTER — Ambulatory Visit: Payer: Medicaid Other

## 2021-01-20 ENCOUNTER — Ambulatory Visit (INDEPENDENT_AMBULATORY_CARE_PROVIDER_SITE_OTHER): Payer: Medicaid Other

## 2021-01-20 ENCOUNTER — Other Ambulatory Visit: Payer: Self-pay

## 2021-01-20 DIAGNOSIS — J4540 Moderate persistent asthma, uncomplicated: Secondary | ICD-10-CM

## 2021-01-20 DIAGNOSIS — J4541 Moderate persistent asthma with (acute) exacerbation: Secondary | ICD-10-CM

## 2021-02-03 ENCOUNTER — Other Ambulatory Visit: Payer: Self-pay

## 2021-02-03 ENCOUNTER — Ambulatory Visit (INDEPENDENT_AMBULATORY_CARE_PROVIDER_SITE_OTHER): Payer: Medicaid Other

## 2021-02-03 DIAGNOSIS — J4540 Moderate persistent asthma, uncomplicated: Secondary | ICD-10-CM | POA: Diagnosis not present

## 2021-02-03 DIAGNOSIS — J4541 Moderate persistent asthma with (acute) exacerbation: Secondary | ICD-10-CM

## 2021-02-04 ENCOUNTER — Other Ambulatory Visit: Payer: Self-pay | Admitting: Allergy

## 2021-02-17 ENCOUNTER — Other Ambulatory Visit: Payer: Self-pay

## 2021-02-17 ENCOUNTER — Ambulatory Visit (INDEPENDENT_AMBULATORY_CARE_PROVIDER_SITE_OTHER): Payer: Medicaid Other

## 2021-02-17 DIAGNOSIS — J4541 Moderate persistent asthma with (acute) exacerbation: Secondary | ICD-10-CM

## 2021-02-17 DIAGNOSIS — J4540 Moderate persistent asthma, uncomplicated: Secondary | ICD-10-CM | POA: Diagnosis not present

## 2021-02-22 NOTE — Progress Notes (Signed)
Follow Up Note  RE: Kristi Allen MRN: [id number] DOB: December 30, 1955 Date of Office Visit: 02/23/2021  Referring provider: List, Nash Shearer, FNP Primary care provider: List, Sarrin, FNP  Chief Complaint: Follow-up (Pt states she have been having trouble with her sinus. Also been having headaches across forehead.) and Asthma  History of Present Illness: I had the pleasure of seeing Kristi Allen for a follow up visit at the [facility] on 02/23/2021. She is a 65 y.o. 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 Her previous allergy office visit was on 11/03/2020 with Dr. 11/05/2020. Today is a regular follow up visit.  CVID (common variable immunodeficiency)  Currently on IVIG infusions at home and doing well on it. No infections/antibiotics use since the last visit but currently having some sinus pressure for the past 2 weeks. No fevers/chills, no lymphadenopathy, no bruising.    Asthma-COPD overlap syndrome  Increased coughing and wheezing.  Doing Xolair 300mg  every 2 weeks and she states that she feels like the medication only lasts for about 1 week.  No reactions.   Still smoking 1.5 ppd.   Currently taking Symbicort Kristi Allen 2 puffs twice a day and Spiriva 1.6mcg 2 puffs once a day.  Did not start budesonide nebulizer.    Seasonal and perennial allergic rhinitis Currently on Flonase and azelastine and saline rinse with minimal benefit. Still having nasal drainage.  Taking Claritin and Singulair daily.  Gastroesophageal reflux disease Stable with omeprazole 20mg  twice a day.   Patient got flu shot. She is scared of getting her Covid-19 booster.  Assessment and Plan: Kristi Allen is a 65 y.o. female with: CVID (common variable immunodeficiency) (HCC) Past history - Patient was diagnosed with CVID prior to coming to our office and has been on weekly Hizentra 7gm injections. She mentions skipping few  doses at times due to inability to self-administer due to her coughing fits. Patient had bronchiectasis on CT chest in past. Still smoking 1.5 pack per day.  Interim history - doing much better with monthly at home IVIG infusions. No antibiotics since the last visit.  Continue at home IVIG infusions once a month.  Keep track of infections. Get bloodwork every 3 months - get it drawn a few days before your infusions.   Asthma-COPD overlap syndrome (HCC) Past history - Patient used to follow with pulmonology as well. Had bronchiectasis on CT chest and failed to follow up. Still smoking 1.5 pack per day which is less than her previous of 3 packs per day.Normal alpha-1 level. Eosinophils 100, IgE 458. Interim history - increased symptoms and still smoking 1.5 ppd.  Discussed smoking cessation and strongly encouraged to stop smoking. Take mucinex twice a day with plenty of water to loosen up the mucous. Get flutter valve. Start prednisone taper.  Continue budesonide nebulizer for 1-2 weeks until your breathing symptoms return to baseline.  Daily controller medication(s): continue Symbicort 2 puffs twice a day with spacer and rinse mouth afterwards. Continue Spiriva 1.63mcg 2 puffs once a day. Continue Xolair 300mg  injections every 2 weeks. Will start PA to switch to [facility] every 4 weeks.  During upper respiratory infections/asthma flares: start budesonide nebulizer for 1-2 weeks until your breathing symptoms return to baseline.  May use albuterol rescue inhaler 2 puffs every 4 to 6 hours as needed for shortness of breath, chest tightness, coughing, and wheezing. May use albuterol rescue inhaler 2 puffs 5 to  15 minutes prior to strenuous physical activities. Monitor frequency of use.  Get spirometry at next visit.  Seasonal and perennial allergic rhinitis Past history - 2021 bloodwork positive to cat, dog, grass, trees, ragweed.  Interim history - increased symptoms, azelastine not  effective. Take loratadine 10mg  daily in the morning.  Continue with Singulair (montelukast) 10mg  daily at night. Use Flonase (fluticasone) nasal spray 1 spray per nostril twice a day as needed for nasal congestion.  Use Atrovent (ipratropium) 0.03% 1-2 sprays per nostril twice a day as needed for runny nose/drainage. Stop azelastine.  Nasal saline spray (i.e., Simply Saline) or nasal saline lavage (i.e., NeilMed) is recommended as needed and prior to medicated nasal sprays. Continue environmental control measures.  Gastroesophageal reflux disease Stable.  Continue omeprazole 20mg  twice a day.  Continue lifestyle and dietary modifications.  Vaccine counseling Got the flu shot but scared of getting Covid-19 vaccine.   Return in about 3 months (around 05/26/2021).  Meds ordered this encounter  Medications   ipratropium (ATROVENT) 0.03 % nasal spray    Sig: Place 1-2 sprays into both nostrils 2 (two) times daily as needed (nasal drainage).    Dispense:  30 mL    Refill:  5    Lab Orders         CBC with Differential/Platelet         IgG, IgA, IgM         Sedimentation rate         Comprehensive metabolic panel      Diagnostics: Spirometry:  Tracings reviewed. Her effort: It was hard to get consistent efforts and there is a question as to whether this reflects a maximal maneuver. FVC: 1.71L FEV1: 1.36L, 62% predicted FEV1/FVC ratio: 80% Interpretation: Spirometry consistent with possible restrictive disease.  Please see scanned spirometry results for details.  Medication List:  Current Outpatient Medications  Medication Sig Dispense Refill   ipratropium (ATROVENT) 0.03 % nasal spray Place 1-2 sprays into both nostrils 2 (two) times daily as needed (nasal drainage). 30 mL 5   albuterol (PROVENTIL) (2.5 MG/3ML) 0.083% nebulizer solution Take 3 mLs (2.5 mg total) by nebulization every 6 (six) hours as needed for wheezing or shortness of breath. (Patient not taking: Reported on  11/03/2020) 75 mL 1   ALLERGY RELIEF 10 MG tablet TAKE ONE TABLET BY MOUTH DAILY 30 tablet 5   amLODipine (NORVASC) 10 MG tablet Take 10 mg by mouth daily.     atorvastatin (LIPITOR) 10 MG tablet Take 10 mg by mouth daily.     budesonide (PULMICORT) 0.5 MG/2ML nebulizer solution USE ONE VIAL IN NEBULIZER IN THE MORNING AND AT BEDTIME ONLY AS NEEDED 120 mL 5   Calcium Carb-Cholecalciferol (CALCIUM CARBONATE-VITAMIN D3) 600-400 MG-UNIT TABS TAKE ONE TABLET BY MOUTH 2 TIMES A DAY     Cholecalciferol (VITAMIN D3) 50 MCG (2000 UT) TABS Take by mouth.     EPINEPHrine (EPIPEN 2-PAK) 0.3 mg/0.3 mL IJ SOAJ injection Use as needed for severe allergic reactions. 2 each 3   fluconazole (DIFLUCAN) 150 MG tablet Take 1 tablet at start and end of Avelox course second (Patient not taking: Reported on 11/03/2020) 2 tablet 0   fluticasone (FLONASE) 50 MCG/ACT nasal spray Place 1 spray into both nostrils in the morning and at bedtime. For drainage 16 g 2   hydrochlorothiazide (HYDRODIURIL) 25 MG tablet Take 25 mg by mouth daily.     ipratropium-albuterol (DUONEB) 0.5-2.5 (3) MG/3ML SOLN Take 3 mLs by nebulization every 6 (  six) hours as needed. 360 mL 5   levocetirizine (XYZAL) 5 MG tablet TAKE ONE TABLET BY MOUTH DAILY AS NEEDED FOR ALLERGIES 30 tablet 5   losartan (COZAAR) 50 MG tablet Take 50 mg by mouth daily.     montelukast (SINGULAIR) 10 MG tablet TAKE ONE TABLET BY MOUTH AT BEDTIME 30 tablet 0   Olopatadine HCl (PATADAY) 0.2 % SOLN Place 1 drop into both eyes daily as needed. 2.5 mL 5   omeprazole (PRILOSEC) 20 MG capsule Take 20 mg by mouth 2 (two) times daily.     PROAIR HFA 108 (90 Base) MCG/ACT inhaler INHALE TWO PUFFS EVERY 4 HOURS AS NEEDED FOR WHEEZING OR SHORTNESS OF BREATH OR coughing fits 8.5 g 2   Respiratory Therapy Supplies (FLUTTER) DEVI 1 Device by Does not apply route as directed. 1 each 0   SYMBICORT 160-4.5 MCG/ACT inhaler INHALE TWO PUFFS BY MOUTH EVERY MORNING AND AT BEDTIME. USE WITH SPACER  AND RINSE MOUTH AFTERWARDS 10.2 g 5   Tiotropium Bromide Monohydrate (SPIRIVA RESPIMAT) 1.25 MCG/ACT AERS Inhale 2 puffs into the lungs daily. 4 g 5   vitamin C (ASCORBIC ACID) 500 MG tablet Take 500 mg by mouth daily.     Zinc 50 MG CAPS Take by mouth.     Current Facility-Administered Medications  Medication Dose Route Frequency Provider Last Rate Last Admin   omalizumab Geoffry Paradise) injection 300 mg  300 mg Subcutaneous Q14 Days Alfonse Spruce, MD   300 mg at 02/17/21 1447   Allergies: Allergies  Allergen Reactions   Doxycycline Rash   Cyclobenzaprine    Hydrocortisone     Breaks out in hives with topical cream.    Sulfamethoxazole-Trimethoprim Swelling   Amoxicillin-Pot Clavulanate Nausea Only   Codeine Rash    Other reaction(s): RASH   I reviewed her past medical history, social history, family history, and environmental history and no significant changes have been reported from her previous visit.  Review of Systems  Constitutional:  Negative for appetite change, chills, fever and unexpected weight change.  HENT:  Positive for congestion, postnasal drip and rhinorrhea.   Eyes:  Negative for itching.  Respiratory:  Positive for cough, chest tightness and shortness of breath.   Gastrointestinal:  Negative for abdominal pain.  Skin:  Negative for rash.  Allergic/Immunologic: Positive for environmental allergies.  Neurological:  Positive for headaches.   Objective: BP 128/70   Pulse 63   Temp 98.2 F (36.8 C) (Temporal)   Resp 19   Ht 5\' 1"  (1.549 m)   Wt 136 lb 6.4 oz (61.9 kg)   SpO2 96%   BMI 25.77 kg/m  Body mass index is 25.77 kg/m. Physical Exam Vitals and nursing note reviewed.  Constitutional:      Appearance: Normal appearance. She is well-developed.  HENT:     Head: Normocephalic and atraumatic.     Right Ear: Tympanic membrane and external ear normal.     Left Ear: Tympanic membrane and external ear normal.     Nose: Congestion and rhinorrhea  present. No mucosal edema.     Mouth/Throat:     Mouth: Mucous membranes are moist.     Pharynx: Oropharynx is clear.  Eyes:     Conjunctiva/sclera: Conjunctivae normal.  Cardiovascular:     Rate and Rhythm: Normal rate and regular rhythm.     Heart sounds: Normal heart sounds. No murmur heard.   No friction rub. No gallop.  Pulmonary:     Effort: Pulmonary effort  is normal.     Breath sounds: Rhonchi present. No wheezing or rales.  Musculoskeletal:     Cervical back: Neck supple.  Skin:    General: Skin is warm.     Findings: No rash.  Neurological:     Mental Status: She is alert and oriented to person, place, and time.  Psychiatric:        Behavior: Behavior normal.   Previous notes and tests were reviewed. The plan was reviewed with the patient/family, and all questions/concerned were addressed.  It was my pleasure to see Kristi Allen today and participate in her care. Please feel free to contact me with any questions or concerns.  Sincerely,  Wyline Mood, DO Allergy & Immunology  [facility] office: [phone] [facility] office: [phone]

## 2021-02-23 ENCOUNTER — Ambulatory Visit (INDEPENDENT_AMBULATORY_CARE_PROVIDER_SITE_OTHER): Payer: Medicaid Other | Admitting: Allergy

## 2021-02-23 ENCOUNTER — Other Ambulatory Visit: Payer: Self-pay

## 2021-02-23 ENCOUNTER — Encounter: Payer: Self-pay | Admitting: Allergy

## 2021-02-23 VITALS — BP 128/70 | HR 63 | Temp 98.2°F | Resp 19 | Ht 61.0 in | Wt 136.4 lb

## 2021-02-23 DIAGNOSIS — Z720 Tobacco use: Secondary | ICD-10-CM

## 2021-02-23 DIAGNOSIS — J3089 Other allergic rhinitis: Secondary | ICD-10-CM

## 2021-02-23 DIAGNOSIS — D839 Common variable immunodeficiency, unspecified: Secondary | ICD-10-CM

## 2021-02-23 DIAGNOSIS — Z7185 Encounter for immunization safety counseling: Secondary | ICD-10-CM

## 2021-02-23 DIAGNOSIS — J449 Chronic obstructive pulmonary disease, unspecified: Secondary | ICD-10-CM

## 2021-02-23 DIAGNOSIS — K219 Gastro-esophageal reflux disease without esophagitis: Secondary | ICD-10-CM

## 2021-02-23 DIAGNOSIS — J302 Other seasonal allergic rhinitis: Secondary | ICD-10-CM

## 2021-02-23 MED ORDER — IPRATROPIUM BROMIDE 0.03 % NA SOLN: 1.0000 | Freq: Two times a day (BID) | NASAL | 5 refills | Status: DC | PRN

## 2021-02-23 NOTE — Assessment & Plan Note (Signed)
Past history - Patient used to follow with pulmonology as well. Had bronchiectasis on CT chest and failed to follow up. Still smoking 1.5 pack per day which is less than her previous of 3 packs per day.Normal alpha-1 level. Eosinophils 100, IgE 458. Interim history - increased symptoms and still smoking 1.5 ppd.   Discussed smoking cessation and strongly encouraged to stop smoking.  Take mucinex twice a day with plenty of water to loosen up the mucous.  Get flutter valve. . Start prednisone taper.   Continue budesonide nebulizer for 1-2 weeks until your breathing symptoms return to baseline.   Daily controller medication(s): continue Symbicort 2 puffs twice a day with spacer and rinse mouth afterwards.  Continue Spiriva 1.67mcg 2 puffs once a day.  Continue Xolair 300mg  injections every 2 weeks.  Will start PA to switch to [facility] every 4 weeks.   During upper respiratory infections/asthma flares: start budesonide nebulizer for 1-2 weeks until your breathing symptoms return to baseline.  . May use albuterol rescue inhaler 2 puffs every 4 to 6 hours as needed for shortness of breath, chest tightness, coughing, and wheezing. May use albuterol rescue inhaler 2 puffs 5 to 15 minutes prior to strenuous physical activities. Monitor frequency of use.  . Get spirometry at next visit.

## 2021-02-23 NOTE — Patient Instructions (Addendum)
CVID (common variable immunodeficiency)  Continue at home infusions once a month.  Keep track of infections. Get bloodwork every 3 months - get it drawn a few days before your infusions.   Take mucinex twice a day with plenty of water to loosen up the mucous. Get flutter valve - about $$40 to 50 online.    Asthma-COPD overlap syndrome  DECREASE smoking. Prednisone 10mg  tablet pack: 2 tablets given in office today. Take 2 more tablets before bed today.  Then take 2 tablets twice a day for 2 more days. Then take 2 tablets once a day for 1 day. Then take 1 tablet once a day for 1 day.   Continue budesonide nebulizer for 1-2 weeks until your breathing symptoms return to baseline.  Daily controller medication(s): continue Symbicort 2 puffs twice a day with spacer and rinse mouth afterwards. Continue Spiriva 1.12mcg 2 puffs once a day. Continue Xolair 300mg  injections every 2 weeks. Will try to switch to Tezspire every 4 weeks.  During upper respiratory infections/asthma flares: start budesonide nebulizer for 1-2 weeks until your breathing symptoms return to baseline.  May use albuterol rescue inhaler 2 puffs every 4 to 6 hours as needed for shortness of breath, chest tightness, coughing, and wheezing. May use albuterol rescue inhaler 2 puffs 5 to 15 minutes prior to strenuous physical activities. Monitor frequency of use.  Asthma control goals:  Full participation in all desired activities (may need albuterol before activity) Albuterol use two times or less a week on average (not counting use with activity) Cough interfering with sleep two times or less a month Oral steroids no more than once a year No hospitalizations   Seasonal and perennial allergic rhinitis 2021 bloodwork positive to cat, dog, grass, trees, ragweed.  Take loratadine 10mg  daily in the morning.  Continue with Singulair (montelukast) 10mg  daily at night. Use Flonase (fluticasone) nasal spray 1 spray per nostril  twice a day as needed for nasal congestion.  Use Atrovent (ipratropium) 0.03% 1-2 sprays per nostril twice a day as needed for runny nose/drainage. Stop azelastine.  Nasal saline spray (i.e., Simply Saline) or nasal saline lavage (i.e., NeilMed) is recommended as needed and prior to medicated nasal sprays. Continue environmental control measures.   Gastroesophageal reflux disease Continue omeprazole 20mg  twice a day.   Follow up in 3 months or sooner if needed.

## 2021-02-23 NOTE — Assessment & Plan Note (Signed)
Past history - 2021 bloodwork positive to cat, dog, grass, trees, ragweed.  Interim history - increased symptoms, azelastine not effective.  Take loratadine 10mg  daily in the morning.   Continue with Singulair (montelukast) 10mg  daily at night.  Use Flonase (fluticasone) nasal spray 1 spray per nostril twice a day as needed for nasal congestion.   Use Atrovent (ipratropium) 0.03% 1-2 sprays per nostril twice a day as needed for runny nose/drainage.  Stop azelastine.   Nasal saline spray (i.e., Simply Saline) or nasal saline lavage (i.e., NeilMed) is recommended as needed and prior to medicated nasal sprays.  Continue environmental control measures.

## 2021-02-23 NOTE — Assessment & Plan Note (Signed)
Got the flu shot but scared of getting Covid-19 vaccine.

## 2021-02-23 NOTE — Assessment & Plan Note (Addendum)
Stable.   Continue omeprazole 20mg  twice a day.  Continue lifestyle and dietary modifications.

## 2021-02-23 NOTE — Assessment & Plan Note (Signed)
Past history - Patient was diagnosed with CVID prior to coming to our office and has been on weekly Hizentra 7gm injections. She mentions skipping few doses at times due to inability to self-administer due to her coughing fits. Patient had bronchiectasis on CT chest in past. Still smoking 1.5 pack per day.  Interim history - doing much better with monthly at home IVIG infusions. No antibiotics since the last visit.   Continue at home IVIG infusions once a month.   Keep track of infections.  Get bloodwork every 3 months - get it drawn a few days before your infusions.

## 2021-02-28 ENCOUNTER — Telehealth: Payer: Self-pay | Admitting: *Deleted

## 2021-02-28 NOTE — Telephone Encounter (Signed)
-----   Message from Ellamae Sia, DO sent at 02/28/2021  9:35 AM EDT ----- Molli Knock. Got it.  Can you please let patient know that [name] is not covered and will continue with Xolair for now.  ----- Message ----- From: Devoria Glassing, CMA Sent: 02/28/2021   9:26 AM EDT To: Ellamae Sia, DO  Unfortunately that is only for commerical patients.  I will put her on my list and when [facility] medicaid finally makes change to cover I will reach out to her. Bich  ----- Message ----- From: Ellamae Sia, DO Sent: 02/28/2021   8:15 AM EDT To: Devoria Glassing, CMA  Her eos are usually around 100.  Can you reach out to the rep? I thought they had maybe 1 year long program?  Otherwise, I will keep her on Xolair for now.  Thanks.  ----- Message ----- From: Devoria Glassing, CMA Sent: 02/25/2021  11:58 AM EDT To: Ellamae Sia, DO  At this time patients Ins will not cover Tezspire on pharmacy or medical benefit. I have been working on several patients with [employer] and not covered benefit and no way to get patient assistance due to govt Ins.If patient has eos we could try for another biologic but I have not seen any history of same. Please advise. Beyla ----- Message ----- From: Ellamae Sia, DO Sent: 02/23/2021  12:57 PM EDT To: Devoria Glassing, CMA  Please start PA for Tezspire every 4 weeks. Xolair is not effective. Thank you.

## 2021-02-28 NOTE — Telephone Encounter (Signed)
L/m for patient to contact me to advise her MCD plan will not cover Tezspire at this time and will keep her in mind for future when they do cover same

## 2021-03-02 ENCOUNTER — Ambulatory Visit: Payer: Medicaid Other | Admitting: Allergy

## 2021-03-03 ENCOUNTER — Ambulatory Visit (INDEPENDENT_AMBULATORY_CARE_PROVIDER_SITE_OTHER): Payer: Medicaid Other

## 2021-03-03 ENCOUNTER — Other Ambulatory Visit: Payer: Self-pay

## 2021-03-03 ENCOUNTER — Other Ambulatory Visit: Payer: Self-pay | Admitting: Allergy

## 2021-03-03 DIAGNOSIS — J4540 Moderate persistent asthma, uncomplicated: Secondary | ICD-10-CM

## 2021-03-07 ENCOUNTER — Telehealth: Payer: Self-pay | Admitting: *Deleted

## 2021-03-07 MED ORDER — OLOPATADINE HCL 0.2 % OP SOLN: 1.0000 [drp] | Freq: Every day | OPHTHALMIC | 5 refills | Status: DC | PRN

## 2021-03-07 NOTE — Telephone Encounter (Signed)
Sent in eye drop that said it was covered will wait to see if it gets kickback

## 2021-03-07 NOTE — Telephone Encounter (Signed)
Spoke to patient and advised that MCD Ins will not cover Tezspire at this time and will keep trying but Dr Selena Batten wants her to remain on Xolair for now.  Patient also advised that herr eye drops Olapatadine is no longer covered and wants alternative called in

## 2021-03-07 NOTE — Addendum Note (Signed)
Addended by: Berna Bue on: 03/07/2021 04:37 PM   Modules accepted: Orders

## 2021-03-10 ENCOUNTER — Other Ambulatory Visit: Payer: Self-pay | Admitting: Allergy

## 2021-03-12 LAB — COMPREHENSIVE METABOLIC PANEL
ALT: 13 IU/L (ref 0–32)
AST: 16 IU/L (ref 0–40)
Albumin/Globulin Ratio: 1.7 (ref 1.2–2.2)
Albumin: 4.4 g/dL (ref 3.8–4.8)
Alkaline Phosphatase: 109 IU/L (ref 44–121)
BUN/Creatinine Ratio: 16 (ref 12–28)
BUN: 12 mg/dL (ref 8–27)
Bilirubin Total: 0.3 mg/dL (ref 0.0–1.2)
CO2: 26 mmol/L (ref 20–29)
Calcium: 9.5 mg/dL (ref 8.7–10.3)
Chloride: 100 mmol/L (ref 96–106)
Creatinine, Ser: 0.75 mg/dL (ref 0.57–1.00)
Globulin, Total: 2.6 g/dL (ref 1.5–4.5)
Glucose: 100 mg/dL — ABNORMAL HIGH (ref 70–99)
Potassium: 4.6 mmol/L (ref 3.5–5.2)
Sodium: 143 mmol/L (ref 134–144)
Total Protein: 7 g/dL (ref 6.0–8.5)
eGFR: 89 mL/min/{1.73_m2} (ref >59)

## 2021-03-12 LAB — CBC WITH DIFFERENTIAL/PLATELET
Basophils Absolute: 0 10*3/uL (ref 0.0–0.2)
Basos: 1 %
EOS (ABSOLUTE): 0.1 10*3/uL (ref 0.0–0.4)
Eos: 1 %
Hematocrit: 43.4 % (ref 34.0–46.6)
Hemoglobin: 14.5 g/dL (ref 11.1–15.9)
Immature Grans (Abs): 0 10*3/uL (ref 0.0–0.1)
Immature Granulocytes: 0 %
Lymphocytes Absolute: 2.1 10*3/uL (ref 0.7–3.1)
Lymphs: 25 %
MCH: 29.5 pg (ref 26.6–33.0)
MCHC: 33.4 g/dL (ref 31.5–35.7)
MCV: 88 fL (ref 79–97)
Monocytes Absolute: 0.6 10*3/uL (ref 0.1–0.9)
Monocytes: 7 %
Neutrophils Absolute: 5.4 10*3/uL (ref 1.4–7.0)
Neutrophils: 66 %
Platelets: 337 10*3/uL (ref 150–450)
RBC: 4.91 x10E6/uL (ref 3.77–5.28)
RDW: 13.2 % (ref 11.7–15.4)
WBC: 8.2 10*3/uL (ref 3.4–10.8)

## 2021-03-12 LAB — SEDIMENTATION RATE: Sed Rate: 22 mm/hr (ref 0–40)

## 2021-03-12 LAB — IGG, IGA, IGM
IgA/Immunoglobulin A, Serum: 157 mg/dL (ref 87–352)
IgG (Immunoglobin G), Serum: 946 mg/dL (ref 586–1602)
IgM (Immunoglobulin M), Srm: 65 mg/dL (ref 26–217)

## 2021-03-17 ENCOUNTER — Ambulatory Visit (INDEPENDENT_AMBULATORY_CARE_PROVIDER_SITE_OTHER): Payer: Medicaid Other

## 2021-03-17 ENCOUNTER — Other Ambulatory Visit: Payer: Self-pay

## 2021-03-17 DIAGNOSIS — J4540 Moderate persistent asthma, uncomplicated: Secondary | ICD-10-CM | POA: Diagnosis not present

## 2021-03-19 ENCOUNTER — Other Ambulatory Visit: Payer: Self-pay | Admitting: Allergy

## 2021-03-28 ENCOUNTER — Other Ambulatory Visit: Payer: Self-pay

## 2021-03-28 ENCOUNTER — Ambulatory Visit (INDEPENDENT_AMBULATORY_CARE_PROVIDER_SITE_OTHER): Payer: Medicaid Other

## 2021-03-28 DIAGNOSIS — J4540 Moderate persistent asthma, uncomplicated: Secondary | ICD-10-CM

## 2021-03-28 NOTE — Telephone Encounter (Signed)
Patient wanted a sample of proair told her we didn't have any samples, but she had 56 doses left and she can get a new rescue inhaler after thanksgiving which is 3 days. Patient stated her breathing does really well after her xolair injection for about a week. Patient is going out of town over thanksgiving, but she is receiving her xolair injections today. May get her rescue inhaler after thanksgiving.

## 2021-04-05 ENCOUNTER — Other Ambulatory Visit: Payer: Self-pay | Admitting: Allergy

## 2021-04-14 ENCOUNTER — Other Ambulatory Visit: Payer: Self-pay

## 2021-04-14 ENCOUNTER — Ambulatory Visit (INDEPENDENT_AMBULATORY_CARE_PROVIDER_SITE_OTHER): Payer: Medicaid Other

## 2021-04-14 DIAGNOSIS — J4540 Moderate persistent asthma, uncomplicated: Secondary | ICD-10-CM

## 2021-04-26 ENCOUNTER — Other Ambulatory Visit: Payer: Self-pay

## 2021-04-26 ENCOUNTER — Ambulatory Visit (INDEPENDENT_AMBULATORY_CARE_PROVIDER_SITE_OTHER): Payer: Medicaid Other

## 2021-04-26 DIAGNOSIS — J4540 Moderate persistent asthma, uncomplicated: Secondary | ICD-10-CM | POA: Diagnosis not present

## 2021-04-28 ENCOUNTER — Ambulatory Visit: Payer: Medicaid Other

## 2021-05-09 ENCOUNTER — Ambulatory Visit (INDEPENDENT_AMBULATORY_CARE_PROVIDER_SITE_OTHER): Payer: Medicaid Other

## 2021-05-09 ENCOUNTER — Other Ambulatory Visit: Payer: Self-pay

## 2021-05-09 DIAGNOSIS — J4540 Moderate persistent asthma, uncomplicated: Secondary | ICD-10-CM

## 2021-05-10 ENCOUNTER — Ambulatory Visit: Payer: Medicaid Other

## 2021-05-16 ENCOUNTER — Other Ambulatory Visit: Payer: Self-pay

## 2021-05-16 ENCOUNTER — Telehealth: Payer: Self-pay | Admitting: *Deleted

## 2021-05-16 MED ORDER — BUDESONIDE 0.5 MG/2ML IN SUSP: RESPIRATORY_TRACT | 5 refills | Status: DC

## 2021-05-16 NOTE — Telephone Encounter (Signed)
Patient called and advised still not doing well on Xolair.  We have tried to get her approved through her MCD plan a couple months ago but had no coverage for drug.  I advised patient I would attempt same again.  I did get approval for drug and have cancelled her upcoming Xolair for next week to submit change to Tezspire and will be in touch with patient

## 2021-05-16 NOTE — Telephone Encounter (Signed)
Okay. Noted.  

## 2021-05-19 ENCOUNTER — Other Ambulatory Visit: Payer: Self-pay | Admitting: Allergy

## 2021-05-24 ENCOUNTER — Other Ambulatory Visit: Payer: Self-pay | Admitting: *Deleted

## 2021-05-24 ENCOUNTER — Ambulatory Visit: Payer: Medicaid Other

## 2021-05-24 ENCOUNTER — Telehealth: Payer: Self-pay | Admitting: Allergy

## 2021-05-24 MED ORDER — NEBULIZER/TUBING/MOUTHPIECE KIT: PACK | 1 refills | Status: DC

## 2021-05-24 NOTE — Telephone Encounter (Signed)
Pt informed. Rx sent to [facility] for extra tubing for neb machine.  °

## 2021-05-24 NOTE — Telephone Encounter (Signed)
Pt informed. Rx sent to [facility] for extra tubing for neb machine.

## 2021-05-24 NOTE — Telephone Encounter (Signed)
Dr. Maudie Mercury needs to sign the order

## 2021-05-24 NOTE — Telephone Encounter (Signed)
Please call patient back.  Recommend that she waits 2 days in between privigen infusions and getting the Tezspire injection.  She get the Tezspire injection even if she's coughing and having nasal congestion.

## 2021-05-24 NOTE — Telephone Encounter (Addendum)
Patient  Kristi Allen has called stating  she is going to start a new medication  soon infection [name]) she is currently getting injections of  Privigen once a month the nurse that gives this injection wanted patient to make sure that both of these medications will coinside. Also patient is asking if she is having coughing spells and congestion can she take the Tezspire injection please advise

## 2021-05-24 NOTE — Telephone Encounter (Signed)
Pt states we need to fax directly to [facility] in [place] I called them and they said we can manually fax rx to [phone].

## 2021-05-24 NOTE — Addendum Note (Signed)
Addended by: Florence Canner on: 05/24/2021 03:30 PM   Modules accepted: Orders

## 2021-05-24 NOTE — Addendum Note (Signed)
Addended by: Maryjean Morn D on: 05/24/2021 03:16 PM   Modules accepted: Orders

## 2021-05-24 NOTE — Addendum Note (Signed)
Addended by: Felipa Emory on: 05/24/2021 03:40 PM   Modules accepted: Orders

## 2021-05-26 ENCOUNTER — Other Ambulatory Visit: Payer: Self-pay

## 2021-05-26 ENCOUNTER — Ambulatory Visit (INDEPENDENT_AMBULATORY_CARE_PROVIDER_SITE_OTHER): Payer: Medicaid Other

## 2021-05-26 DIAGNOSIS — J4550 Severe persistent asthma, uncomplicated: Secondary | ICD-10-CM | POA: Diagnosis not present

## 2021-05-26 MED ORDER — TEZEPELUMAB-EKKO 210 MG/1.91ML ~~LOC~~ SOSY
210.0000 mg | PREFILLED_SYRINGE | SUBCUTANEOUS | Status: AC
Start: 2021-05-26 — End: ?
  Administered 2021-05-26 – 2024-05-06 (×22): 210 mg via SUBCUTANEOUS

## 2021-05-26 NOTE — Progress Notes (Signed)
Patient started Tezspire  210 mg/1.91 mL today. She will continue every 28 days. Instructions given. No problems after 30 minute wait.

## 2021-05-31 ENCOUNTER — Other Ambulatory Visit: Payer: Self-pay | Admitting: Allergy

## 2021-06-02 ENCOUNTER — Telehealth: Payer: Self-pay | Admitting: Allergy

## 2021-06-02 ENCOUNTER — Other Ambulatory Visit: Payer: Self-pay

## 2021-06-02 ENCOUNTER — Other Ambulatory Visit: Payer: Self-pay | Admitting: *Deleted

## 2021-06-02 MED ORDER — VENTOLIN HFA 108 (90 BASE) MCG/ACT IN AERS: 2.0000 | INHALATION_SPRAY | RESPIRATORY_TRACT | 1 refills | Status: DC | PRN

## 2021-06-02 NOTE — Telephone Encounter (Signed)
Pt request refill for ventolin and pt needs tubing ordered for her nebulizer

## 2021-06-03 NOTE — Telephone Encounter (Signed)
Logan took care of it already.

## 2021-06-18 ENCOUNTER — Other Ambulatory Visit: Payer: Self-pay | Admitting: Allergy

## 2021-06-23 ENCOUNTER — Ambulatory Visit (INDEPENDENT_AMBULATORY_CARE_PROVIDER_SITE_OTHER): Payer: Medicaid Other

## 2021-06-23 ENCOUNTER — Other Ambulatory Visit: Payer: Self-pay

## 2021-06-23 DIAGNOSIS — J4550 Severe persistent asthma, uncomplicated: Secondary | ICD-10-CM | POA: Diagnosis not present

## 2021-07-11 ENCOUNTER — Other Ambulatory Visit: Payer: Self-pay | Admitting: Allergy

## 2021-07-21 ENCOUNTER — Ambulatory Visit: Payer: Medicaid Other

## 2021-07-26 ENCOUNTER — Ambulatory Visit: Payer: Medicaid Other

## 2021-07-28 ENCOUNTER — Ambulatory Visit (INDEPENDENT_AMBULATORY_CARE_PROVIDER_SITE_OTHER): Payer: 59 | Admitting: Family Medicine

## 2021-07-28 ENCOUNTER — Encounter: Payer: Self-pay | Admitting: Family Medicine

## 2021-07-28 ENCOUNTER — Other Ambulatory Visit: Payer: Self-pay

## 2021-07-28 VITALS — BP 130/64 | HR 78 | Temp 98.0°F | Resp 20 | Wt 132.0 lb

## 2021-07-28 DIAGNOSIS — Z720 Tobacco use: Secondary | ICD-10-CM

## 2021-07-28 DIAGNOSIS — J4550 Severe persistent asthma, uncomplicated: Secondary | ICD-10-CM | POA: Diagnosis not present

## 2021-07-28 DIAGNOSIS — J449 Chronic obstructive pulmonary disease, unspecified: Secondary | ICD-10-CM | POA: Diagnosis not present

## 2021-07-28 DIAGNOSIS — K219 Gastro-esophageal reflux disease without esophagitis: Secondary | ICD-10-CM

## 2021-07-28 DIAGNOSIS — J3089 Other allergic rhinitis: Secondary | ICD-10-CM | POA: Diagnosis not present

## 2021-07-28 DIAGNOSIS — I499 Cardiac arrhythmia, unspecified: Secondary | ICD-10-CM

## 2021-07-28 DIAGNOSIS — D839 Common variable immunodeficiency, unspecified: Secondary | ICD-10-CM | POA: Diagnosis not present

## 2021-07-28 NOTE — Progress Notes (Signed)
? ?[address] ?[place] [address] ?Dept: [phone] ? ?FOLLOW UP NOTE ? ?Patient ID: Kristi Allen, female    DOB: Apr 18, 1956  Age: 66 y.o. MRN: [id number] ?Date of Office Visit: 07/28/2021 ? ?Assessment  ?Chief Complaint: Asthma and COPD (Pt states that her allergy and asthma symptoms having been flaring up, she haven't receive her shot in about 2 weeks insurance is requiring her to be seen and resubmit, she changes her insurance.) ? ?HPI ?Kristi Allen is a 66 year old female who presents to the clinic for follow-up visit.  She was last seen in this clinic on 02/23/2021 by Dr. Selena Batten for evaluation of asthma/COPD overlap, allergic rhinitis, reflux, CVID on IVIG and tobacco use.  At today's visit, she reports her asthma has been moderately well controlled with occasional shortness of breath, wheeze, and cough producing mucus ranging from clear to yellow.  She continues Symbicort 160-2 puffs twice a day, Spiriva 1.25 mcg 2 puffs once a day, and uses albuterol once daily on a regular schedule.  She continues Tezspire injections once every 4 weeks with no large or local reactions.  She reports a significant decrease in her symptoms of asthma while continuing on Tezspire injections.  She does report that her insurance has recently changed and she has about 2 weeks behind on her Tezspire injection.  Allergic rhinitis is reported as moderately well controlled with symptoms intermittent clear rhinorrhea and postnasal drainage.  She continues montelukast 10 mg once a day, loratadine 10 mg once a day, Flonase daily, and nasal saline rinses daily.  She denies recent infections or antibiotics.  She continues IVIG infusions once a week with no adverse reactions, no fevers, no chills, and no bruising.  Reflux is reported as well controlled with no symptoms including heartburn or vomiting.  She continues omeprazole 20 mg twice a day.  She continues to smoke cigarettes and has actually increased her cigarette intake to 2  packs a day recently.  She reports this is due to the stress of changing insurance.  Her current medications are listed in the chart. ?Of note, when asked about her irregular heart rhythm at today's visit, she reports that she had a caregiver in her home on Saturday that mentioned her heart rate was irregular. ? ?Drug Allergies:  ?Allergies  ?Allergen Reactions  ? Doxycycline Rash  ? Cyclobenzaprine   ? Hydrocortisone   ?  Breaks out in hives with topical cream.   ? Sulfamethoxazole-Trimethoprim Swelling  ? Amoxicillin-Pot Clavulanate Nausea Only  ? Codeine Rash  ?  Other reaction(s): RASH  ? ? ?Physical Exam: ?BP 130/64   Pulse 78   Temp 98 ?F (36.7 ?C) (Temporal)   Resp 20   Wt 132 lb (59.9 kg)   SpO2 98%   BMI 24.94 kg/m?   ? ?Physical Exam ?Vitals reviewed.  ?Constitutional:   ?   Appearance: Normal appearance.  ?HENT:  ?   Head: Normocephalic and atraumatic.  ?   Right Ear: Tympanic membrane normal.  ?   Left Ear: Tympanic membrane normal.  ?   Nose:  ?   Comments: Bilateral nares slightly erythematous with clear nasal drainage noted.  Pharynx is slightly erythematous with no exudate.  Ears normal.  Eyes normal. ?Eyes:  ?   Conjunctiva/sclera: Conjunctivae normal.  ?Cardiovascular:  ?   Rate and Rhythm: Normal rate. Rhythm irregular.  ?   Heart sounds: No murmur heard. ?   Comments: Irregular rhythm noted ?Pulmonary:  ?   Effort: Pulmonary  effort is normal.  ?   Breath sounds: Normal breath sounds.  ?   Comments: Bilateral rhonchi that cleared with cough ?Musculoskeletal:     ?   General: Normal range of motion.  ?   Cervical back: Normal range of motion and neck supple.  ?Skin: ?   General: Skin is warm and dry.  ?Neurological:  ?   Mental Status: She is alert and oriented to person, place, and time.  ?Psychiatric:     ?   Mood and Affect: Mood normal.     ?   Behavior: Behavior normal.     ?   Thought Content: Thought content normal.     ?   Judgment: Judgment normal.  ? ? ?Diagnostics: ?FVC 1.81, FEV1  1.48.  Predicted FVC 2.80, predicted FEV1 2.20.  Spirometry indicates restriction.  This is consistent with previous spirometry readings. ? ?Assessment and Plan: ?1. Severe persistent asthma without complication   ?2. Asthma-COPD overlap syndrome (HCC)   ?3. Seasonal and perennial allergic rhinitis   ?4. CVID (common variable immunodeficiency) (HCC)   ?5. Gastroesophageal reflux disease, unspecified whether esophagitis present   ?6. Tobacco use   ?7. Irregular heart rhythm   ? ? ?Patient Instructions  ?Asthma/COPD ?Continue montelukast 10 mg once a day to prevent cough or wheeze ?Continue Symbicort 160-2 puffs twice a day with a spacer to prevent cough or wheeze ?Continue Spiriva 1.25 mcg 2 puffs once a day to prevent cough or wheeze ?Continue albuterol 2 puffs every 4 hours as needed for cough or wheeze OR Instead use albuterol 0.083% solution via nebulizer one unit vial every 4 hours as needed for cough or wheeze ?You may use albuterol 2 puffs 5 to 15 minutes before activity to decrease cough or wheeze ?Continue Tezspire injections once a month to control asthma symptoms ? ?Allergic rhinitis ?Continue allergen avoidance measures directed toward grass pollen, tree pollen, ragweed pollen, cat, and dog as listed below ?Continue loratadine 10 mg once a day as needed for runny nose or itch ?Continue Flonase 2 sprays in each nostril once a day as needed for stuffy nose ?Continue Atrovent 2 sprays in each nostril twice a day as needed for runny nose ?Consider saline nasal rinses as needed for nasal symptoms. Use this before any medicated nasal sprays for best result ? ?Reflux ?Continue dietary lifestyle modifications as listed below ?Continue omeprazole as previously prescribed ? ?CVID ?Continue Hizentra infusion once a week ?Keep track of infections and antibiotic use ?Continue to get blood work once every 3 months. An order has been placed for these labs ? ?Tobacco use ?Try to cut down on cigarette smoking and best to  quit. ? ?Irregular heart rhythm ?Follow-up with your primary care doctor immediately for evaluation and treatment of your irregular heart rhythm ?Need for immediate evaluation of irregular heart rhythm was thoroughly covered at today's visit ? ?Call the clinic if this treatment plan is not working well for you. ? ?Follow up in 3 months or sooner if needed. ? ?Return in about 3 months (around 10/28/2021), or if symptoms worsen or fail to improve. ?  ? ?Thank you for the opportunity to care for this patient.  Please do not hesitate to contact me with questions. ? ?Thermon Leyland, FNP ?[facility] ? ? ? ? ? ?

## 2021-07-28 NOTE — Patient Instructions (Addendum)
Asthma/COPD ?Continue montelukast 10 mg once a day to prevent cough or wheeze ?Continue Symbicort 160-2 puffs twice a day with a spacer to prevent cough or wheeze ?Continue Spiriva 1.25 mcg 2 puffs once a day to prevent cough or wheeze ?Continue albuterol 2 puffs every 4 hours as needed for cough or wheeze OR Instead use albuterol 0.083% solution via nebulizer one unit vial every 4 hours as needed for cough or wheeze ?You may use albuterol 2 puffs 5 to 15 minutes before activity to decrease cough or wheeze ?Continue Tezspire injections once a month to control asthma symptoms ? ?Allergic rhinitis ?Continue allergen avoidance measures directed toward grass pollen, tree pollen, ragweed pollen, cat, and dog as listed below ?Continue loratadine 10 mg once a day as needed for runny nose or itch ?Continue Flonase 2 sprays in each nostril once a day as needed for stuffy nose ?Continue Atrovent 2 sprays in each nostril twice a day as needed for runny nose ?Consider saline nasal rinses as needed for nasal symptoms. Use this before any medicated nasal sprays for best result ? ?Reflux ?Continue dietary lifestyle modifications as listed below ?Continue omeprazole as previously prescribed ? ?CVID ?Continue Hizentra infusion once a week ?Keep track of infections and antibiotic use ?Continue to get blood work once every 3 months. An order has been placed for these labs ? ?Tobacco use ?Try to cut down on cigarette smoking and best to quit. ? ?Irregular heart rhythm ?Follow-up with your primary care doctor immediately for evaluation and treatment of your irregular heart rhythm ?Need for immediate evaluation of irregular heart rhythm was thoroughly covered at today's visit ? ?Call the clinic if this treatment plan is not working well for you. ? ?Follow up in 3 months or sooner if needed. ?

## 2021-07-30 LAB — CBC WITH DIFFERENTIAL/PLATELET
Basophils Absolute: 0 10*3/uL (ref 0.0–0.2)
Basos: 1 %
EOS (ABSOLUTE): 0.1 10*3/uL (ref 0.0–0.4)
Eos: 1 %
Hematocrit: 40.2 % (ref 34.0–46.6)
Hemoglobin: 13.8 g/dL (ref 11.1–15.9)
Immature Grans (Abs): 0 10*3/uL (ref 0.0–0.1)
Immature Granulocytes: 0 %
Lymphocytes Absolute: 2.3 10*3/uL (ref 0.7–3.1)
Lymphs: 31 %
MCH: 30.5 pg (ref 26.6–33.0)
MCHC: 34.3 g/dL (ref 31.5–35.7)
MCV: 89 fL (ref 79–97)
Monocytes Absolute: 0.5 10*3/uL (ref 0.1–0.9)
Monocytes: 7 %
Neutrophils Absolute: 4.6 10*3/uL (ref 1.4–7.0)
Neutrophils: 60 %
Platelets: 342 10*3/uL (ref 150–450)
RBC: 4.53 x10E6/uL (ref 3.77–5.28)
RDW: 12.8 % (ref 11.7–15.4)
WBC: 7.5 10*3/uL (ref 3.4–10.8)

## 2021-07-30 LAB — COMPREHENSIVE METABOLIC PANEL
ALT: 14 IU/L (ref 0–32)
AST: 15 IU/L (ref 0–40)
Albumin/Globulin Ratio: 1.6 (ref 1.2–2.2)
Albumin: 4.1 g/dL (ref 3.8–4.8)
Alkaline Phosphatase: 105 IU/L (ref 44–121)
BUN/Creatinine Ratio: 14 (ref 12–28)
BUN: 11 mg/dL (ref 8–27)
Bilirubin Total: 0.3 mg/dL (ref 0.0–1.2)
CO2: 23 mmol/L (ref 20–29)
Calcium: 9.4 mg/dL (ref 8.7–10.3)
Chloride: 100 mmol/L (ref 96–106)
Creatinine, Ser: 0.81 mg/dL (ref 0.57–1.00)
Globulin, Total: 2.6 g/dL (ref 1.5–4.5)
Glucose: 109 mg/dL — ABNORMAL HIGH (ref 70–99)
Potassium: 4.4 mmol/L (ref 3.5–5.2)
Sodium: 138 mmol/L (ref 134–144)
Total Protein: 6.7 g/dL (ref 6.0–8.5)
eGFR: 81 mL/min/{1.73_m2} (ref >59)

## 2021-07-30 LAB — IGG, IGA, IGM
IgA/Immunoglobulin A, Serum: 150 mg/dL (ref 87–352)
IgG (Immunoglobin G), Serum: 936 mg/dL (ref 586–1602)
IgM (Immunoglobulin M), Srm: 60 mg/dL (ref 26–217)

## 2021-07-30 LAB — SEDIMENTATION RATE: Sed Rate: 36 mm/hr (ref 0–40)

## 2021-08-10 ENCOUNTER — Ambulatory Visit (INDEPENDENT_AMBULATORY_CARE_PROVIDER_SITE_OTHER): Payer: 59

## 2021-08-10 ENCOUNTER — Other Ambulatory Visit: Payer: Self-pay | Admitting: Allergy

## 2021-08-10 DIAGNOSIS — J4550 Severe persistent asthma, uncomplicated: Secondary | ICD-10-CM | POA: Diagnosis not present

## 2021-08-11 ENCOUNTER — Ambulatory Visit: Payer: Medicaid Other

## 2021-08-21 ENCOUNTER — Other Ambulatory Visit: Payer: Self-pay | Admitting: Allergy

## 2021-08-24 NOTE — Addendum Note (Signed)
Addended by: Devoria Glassing on: 08/24/2021 10:52 AM ? ? Modules accepted: Orders ? ?

## 2021-08-26 ENCOUNTER — Other Ambulatory Visit: Payer: Self-pay | Admitting: Allergy

## 2021-09-04 ENCOUNTER — Other Ambulatory Visit: Payer: Self-pay | Admitting: Allergy

## 2021-09-08 ENCOUNTER — Ambulatory Visit (INDEPENDENT_AMBULATORY_CARE_PROVIDER_SITE_OTHER): Payer: 59

## 2021-09-08 ENCOUNTER — Ambulatory Visit: Payer: 59

## 2021-09-08 DIAGNOSIS — J4550 Severe persistent asthma, uncomplicated: Secondary | ICD-10-CM

## 2021-09-19 ENCOUNTER — Telehealth: Payer: Self-pay | Admitting: Allergy

## 2021-09-19 NOTE — Telephone Encounter (Signed)
Pharmacy stating a PA is needed for RX budesonide (PULMICORT) 0.5 MG/2ML nebulizer solution [[id number] please advise  ?

## 2021-09-20 ENCOUNTER — Other Ambulatory Visit: Payer: Self-pay | Admitting: Allergy

## 2021-09-20 MED ORDER — BUDESONIDE 0.5 MG/2ML IN SUSP: RESPIRATORY_TRACT | 5 refills | Status: DC

## 2021-09-20 NOTE — Telephone Encounter (Signed)
Sent in refill of pulmicort ?

## 2021-09-21 MED ORDER — BUDESONIDE 0.5 MG/2ML IN SUSP: RESPIRATORY_TRACT | 5 refills | Status: DC

## 2021-10-06 ENCOUNTER — Ambulatory Visit (INDEPENDENT_AMBULATORY_CARE_PROVIDER_SITE_OTHER): Payer: 59

## 2021-10-06 DIAGNOSIS — J4550 Severe persistent asthma, uncomplicated: Secondary | ICD-10-CM | POA: Diagnosis not present

## 2021-11-02 ENCOUNTER — Ambulatory Visit (INDEPENDENT_AMBULATORY_CARE_PROVIDER_SITE_OTHER): Payer: 59 | Admitting: Allergy

## 2021-11-02 ENCOUNTER — Encounter: Payer: Self-pay | Admitting: Allergy

## 2021-11-02 ENCOUNTER — Telehealth: Payer: Self-pay

## 2021-11-02 VITALS — Wt 139.0 lb

## 2021-11-02 DIAGNOSIS — K219 Gastro-esophageal reflux disease without esophagitis: Secondary | ICD-10-CM

## 2021-11-02 DIAGNOSIS — J069 Acute upper respiratory infection, unspecified: Secondary | ICD-10-CM

## 2021-11-02 DIAGNOSIS — J302 Other seasonal allergic rhinitis: Secondary | ICD-10-CM

## 2021-11-02 DIAGNOSIS — J4489 Other specified chronic obstructive pulmonary disease: Secondary | ICD-10-CM

## 2021-11-02 DIAGNOSIS — D839 Common variable immunodeficiency, unspecified: Secondary | ICD-10-CM | POA: Diagnosis not present

## 2021-11-02 DIAGNOSIS — J3089 Other allergic rhinitis: Secondary | ICD-10-CM

## 2021-11-02 DIAGNOSIS — J449 Chronic obstructive pulmonary disease, unspecified: Secondary | ICD-10-CM

## 2021-11-02 DIAGNOSIS — Z720 Tobacco use: Secondary | ICD-10-CM

## 2021-11-02 MED ORDER — ALBUTEROL SULFATE HFA 108 (90 BASE) MCG/ACT IN AERS: 2.0000 | INHALATION_SPRAY | RESPIRATORY_TRACT | 1 refills | Status: DC | PRN

## 2021-11-02 MED ORDER — PREDNISONE 10 MG PO TABS: 10.0000 mg | ORAL_TABLET | Freq: Every day | ORAL | 0 refills | Status: DC

## 2021-11-02 MED ORDER — DOXYCYCLINE HYCLATE 100 MG PO CAPS: 100.0000 mg | ORAL_CAPSULE | Freq: Two times a day (BID) | ORAL | 0 refills | Status: DC

## 2021-11-02 NOTE — Assessment & Plan Note (Signed)
Past history - 2021 bloodwork positive to cat, dog, grass, trees, ragweed.  Interim history - increased symptoms due to URI.  Take loratadine 10mg  daily in the morning.   Continue with Singulair (montelukast) 10mg  daily at night.  Use Flonase (fluticasone) nasal spray 1 spray per nostril twice a day as needed for nasal congestion.   Use Atrovent (ipratropium) 0.03% 1-2 sprays per nostril twice a day as needed for runny nose/drainage.  Nasal saline spray (i.e., Simply Saline) or nasal saline lavage (i.e., NeilMed) is recommended as needed and prior to medicated nasal sprays.  Continue environmental control measures.

## 2021-11-02 NOTE — Telephone Encounter (Signed)
Please call patient.  She can come in today for a visit or do a televisit with me. I have plenty of openings in [place].  Yes, she can get her injection.

## 2021-11-02 NOTE — Assessment & Plan Note (Signed)
Past history - Patient was diagnosed with CVID prior to coming to our office and has been on weekly Hizentra 7gm injections. She mentions skipping few doses at times due to inability to self-administer due to her coughing fits. Patient had bronchiectasis on CT chest in past. Still smoking 1.5 pack per day.  Interim history - No antibiotics since the last visit.   Continue at home infusions once a month with Privigen.  Keep track of infections.  Get bloodwork every 3 months - get it drawn a few days before your infusions.

## 2021-11-02 NOTE — Patient Instructions (Addendum)
Upper respiratory infection Take mucinex twice a day with plenty of water to loosen up the mucous. Take prednisone 40mg  daily x 2 days, 30mg  daily x 2 days, 20mg  daily x 2 days and 10mg  daily x 2 days. Start doxycycline 100mg  twice a day for 14 days. See below for URI symptom control.   CVID (common variable immunodeficiency)  Continue at home infusions once a month with Privigen. Keep track of infections. Get bloodwork every 3 months - get it drawn a few days before your infusions.    Asthma-COPD overlap syndrome  DECREASE smoking.  START budesonide (Pulmicort) 0.5mg  nebulizer for 1-2 weeks until your breathing symptoms return to baseline.  Daily controller medication(s): continue Symbicort 2 puffs twice a day with spacer and rinse mouth afterwards. Continue Spiriva 1.27mcg 2 puffs once a day. Continue Tezspire injections every 4 weeks.  During upper respiratory infections/flares:  Start budesonide (Pulmicort) 0.5mg  nebulizer twice a day for 1-2 weeks until your breathing symptoms return to baseline.  Pretreat with albuterol 2 puffs or albuterol nebulizer.  If you need to use your albuterol nebulizer machine back to back within 15-30 minutes with no relief then please go to the ER/urgent care for further evaluation.  May use albuterol rescue inhaler 2 puffs or nebulizer every 4 to 6 hours as needed for shortness of breath, chest tightness, coughing, and wheezing. May use albuterol rescue inhaler 2 puffs 5 to 15 minutes prior to strenuous physical activities. Monitor frequency of use.  Asthma control goals:  Full participation in all desired activities (may need albuterol before activity) Albuterol use two times or less a week on average (not counting use with activity) Cough interfering with sleep two times or less a month Oral steroids no more than once a year No hospitalizations    Seasonal and perennial allergic rhinitis 2021 bloodwork positive to cat, dog, grass, trees,  ragweed.  Take loratadine 10mg  daily in the morning.  Continue with Singulair (montelukast) 10mg  daily at night. Use Flonase (fluticasone) nasal spray 1 spray per nostril twice a day as needed for nasal congestion.  Use Atrovent (ipratropium) 0.03% 1-2 sprays per nostril twice a day as needed for runny nose/drainage. Nasal saline spray (i.e., Simply Saline) or nasal saline lavage (i.e., NeilMed) is recommended as needed and prior to medicated nasal sprays. Continue environmental control measures.   Gastroesophageal reflux disease Continue omeprazole 20mg  twice a day.   Follow up in 1 month with me in the [facility] office - appointment made for July 26th at 11:30AM.  Drink plenty of fluids. Water, juice, clear broth or warm lemon water are good choices. Avoid caffeine and alcohol, which can dehydrate you. Eat chicken soup. Chicken soup and other warm fluids can be soothing and loosen congestion. Rest. Adjust your room's temperature and humidity. Keep your room warm but not overheated. If the air is dry, a cool-mist humidifier or vaporizer can moisten the air and help ease congestion and coughing. Keep the humidifier clean to prevent the growth of bacteria and molds. Soothe your throat. Perform a saltwater gargle. Dissolve one-quarter to a half teaspoon of salt in a 4- to 8-ounce glass of warm water. This can relieve a sore or scratchy throat temporarily. Use saline nasal drops. To help relieve nasal congestion, try saline nasal drops. You can buy these drops over the counter, and they can help relieve symptoms ? even in children. Take over-the-counter cold and cough medications. For adults and children older than 5, over-the-counter decongestants, antihistamines and  pain relievers might offer some symptom relief. However, they won't prevent a cold or shorten its duration.

## 2021-11-02 NOTE — Assessment & Plan Note (Signed)
   Continue omeprazole 20mg  twice a day.  Continue lifestyle and dietary modifications.

## 2021-11-02 NOTE — Progress Notes (Signed)
RE: Kristi Allen MRN: [id number] DOB: 1955-09-14 Date of Telemedicine Visit: 11/02/2021  Referring provider: List, Nash Shearer, FNP Primary care provider: List, Nash Shearer, FNP  Chief Complaint: Asthma (Coughing started over the weekend with a little bit of wheezing. Cough up yellow sputum and coming out of her nasal passage as well. Her chest feel congested and tight.)   Telemedicine Follow Up Visit via Telephone: I connected with Kristi Allen for a follow up on 11/02/21 by telephone and verified that I am speaking with the correct person using two identifiers.   I discussed the limitations, risks, security and privacy concerns of performing an evaluation and management service by telephone and the availability of in person appointments. I also discussed with the patient that there may be a patient responsible charge related to this service. The patient expressed understanding and agreed to proceed.  Patient is at home/work. Provider is at the office.  Visit start time: 3:15PM Visit end time: 3:50PM Insurance consent/check in by: front desk Medical consent and medical assistant/nurse: Tempie Donning.  History of Present Illness: She is a 66 y.o. 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 Her previous allergy office visit was on 07/28/2021 with Thermon Leyland, FNP. Today is a new complaint visit of not feeling well .  Patient noted some coughing and worsening drainage with discolored mucous since the past weekend. Symptoms have been worsening as the days went by.  Denies any fevers/chills.  Son was ill 1 week ago with bronchitis.   She tolerated doxycycline in the past.   Asthma/COPD Currently on Symbicort 2 puffs BID, Spiriva 1.30mcg 2 puffs once a day.  Using albuterol 2 puffs every 3-4 hours for the past weekend.  She did not start on Pulmicort neb yet.  Doing well with Tezspire every 4 weeks but notices some asthma symptoms  when she is due for injection 1 week before.  No recent prednisone use.   Allergic rhinitis Currently using loratadine, saline nasal spray, Flonase 2 sprays once a day, Atrovent 2 sprays once a day.   Reflux Stable with omeprazole.    CVID Currently on Privigen every 4 weeks via IV infusion at home.   Tobacco use Still smoking but can't smoke right now as much due to her current URI.  Assessment and Plan: Kristi Allen is a 66 y.o. female with: Upper respiratory tract infection Worsening symptoms with discolored mucous. No fevers. Son was ill 1 week ago. Given CVID history with asthma will treat with antibiotics and prednisone.  Take mucinex twice a day with plenty of water to loosen up the mucous. Take prednisone 40mg  daily x 2 days, 30mg  daily x 2 days, 20mg  daily x 2 days and 10mg  daily x 2 days. Start doxycycline 100mg  twice a day for 14 days. See below for URI symptom control.   Asthma-COPD overlap syndrome (HCC) Past history - Patient used to follow with pulmonology as well. Had bronchiectasis on CT chest and failed to follow up. Still smoking 1.5 pack per day which is less than her previous of 3 packs per day.Normal alpha-1 level. Eosinophils 100, IgE 458. Interim history - increased symptoms since sick. Noted Tezspire doesn't last full 4 weeks, symptomatic 1 week prior.  Discussed smoking cessation and strongly encouraged to stop smoking. START budesonide (Pulmicort) 0.5mg  nebulizer for 1-2 weeks until your breathing symptoms return to baseline.  Daily controller medication(s): continue Symbicort 2 puffs twice a day with spacer and rinse mouth  afterwards. Continue Spiriva 1.38mcg 2 puffs once a day. Continue Tezspire injections every 4 weeks - okay to get tomorrow.  During upper respiratory infections/flares:  Start budesonide (Pulmicort) 0.5mg  nebulizer twice a day for 1-2 weeks until your breathing symptoms return to baseline.  Pretreat with albuterol 2 puffs or albuterol  nebulizer.  If you need to use your albuterol nebulizer machine back to back within 15-30 minutes with no relief then please go to the ER/urgent care for further evaluation.  May use albuterol rescue inhaler 2 puffs or nebulizer every 4 to 6 hours as needed for shortness of breath, chest tightness, coughing, and wheezing. May use albuterol rescue inhaler 2 puffs 5 to 15 minutes prior to strenuous physical activities. Monitor frequency of use.  Get spirometry at next visit.  CVID (common variable immunodeficiency) (HCC) Past history - Patient was diagnosed with CVID prior to coming to our office and has been on weekly Hizentra 7gm injections. She mentions skipping few doses at times due to inability to self-administer due to her coughing fits. Patient had bronchiectasis on CT chest in past. Still smoking 1.5 pack per day.  Interim history - No antibiotics since the last visit.  Continue at home infusions once a month with Privigen. Keep track of infections. Get bloodwork every 3 months - get it drawn a few days before your infusions.   Seasonal and perennial allergic rhinitis Past history - 2021 bloodwork positive to cat, dog, grass, trees, ragweed.  Interim history - increased symptoms due to URI. Take loratadine 10mg  daily in the morning.  Continue with Singulair (montelukast) 10mg  daily at night. Use Flonase (fluticasone) nasal spray 1 spray per nostril twice a day as needed for nasal congestion.  Use Atrovent (ipratropium) 0.03% 1-2 sprays per nostril twice a day as needed for runny nose/drainage. Nasal saline spray (i.e., Simply Saline) or nasal saline lavage (i.e., NeilMed) is recommended as needed and prior to medicated nasal sprays. Continue environmental control measures.  Gastroesophageal reflux disease Continue omeprazole 20mg  twice a day.  Continue lifestyle and dietary modifications.  Return in about 4 weeks (around 11/30/2021).  Meds ordered this encounter  Medications    albuterol (VENTOLIN HFA) 108 (90 Base) MCG/ACT inhaler    Sig: Inhale 2 puffs into the lungs every 4 (four) hours as needed for wheezing or shortness of breath (coughing fits).    Dispense:  18 g    Refill:  1   predniSONE (DELTASONE) 10 MG tablet    Sig: Take 1 tablet (10 mg total) by mouth daily with breakfast. Take prednisone 40mg  daily x 2 days, 30mg  daily x 2 days, 20mg  daily x 2 days and 10mg  daily x 2 days.    Dispense:  20 tablet    Refill:  0   doxycycline (VIBRAMYCIN) 100 MG capsule    Sig: Take 1 capsule (100 mg total) by mouth 2 (two) times daily.    Dispense:  28 capsule    Refill:  0   Lab Orders  No laboratory test(s) ordered today    Diagnostics: None.  Medication List:  Current Outpatient Medications  Medication Sig Dispense Refill   albuterol (PROVENTIL) (2.5 MG/3ML) 0.083% nebulizer solution Take 3 mLs (2.5 mg total) by nebulization every 6 (six) hours as needed for wheezing or shortness of breath. 75 mL 1   albuterol (VENTOLIN HFA) 108 (90 Base) MCG/ACT inhaler Inhale 2 puffs into the lungs every 4 (four) hours as needed for wheezing or shortness of breath (coughing fits). 18  g 1   ALLERGY RELIEF 10 MG tablet TAKE ONE TABLET BY MOUTH DAILY 30 tablet 5   amLODipine (NORVASC) 10 MG tablet Take 10 mg by mouth daily.     atorvastatin (LIPITOR) 10 MG tablet Take 10 mg by mouth daily.     budesonide (PULMICORT) 0.5 MG/2ML nebulizer solution USE ONE VIAL IN NEBULIZER IN THE MORNING AND AT BEDTIME ONLY AS NEEDED 120 mL 5   budesonide-formoterol (SYMBICORT) 160-4.5 MCG/ACT inhaler INHALE TWO PUFFS BY MOUTH EVERY MORNING AND AT BEDTIME. USE WITH SPACER AND RINSE MOUTH AFTERWARDS 10.2 g 1   buPROPion (WELLBUTRIN XL) 150 MG 24 hr tablet Take 1 tablet by mouth daily.     busPIRone (BUSPAR) 10 MG tablet Take by mouth.     Calcium Carb-Cholecalciferol (CALCIUM CARBONATE-VITAMIN D3) 600-400 MG-UNIT TABS TAKE ONE TABLET BY MOUTH 2 TIMES A DAY     Cholecalciferol (VITAMIN D3) 50  MCG (2000 UT) TABS Take by mouth.     doxycycline (VIBRAMYCIN) 100 MG capsule Take 1 capsule (100 mg total) by mouth 2 (two) times daily. 28 capsule 0   EPINEPHrine (EPIPEN 2-PAK) 0.3 mg/0.3 mL IJ SOAJ injection Use as needed for severe allergic reactions. 2 each 3   fluticasone (FLONASE) 50 MCG/ACT nasal spray Place 1 spray into both nostrils in the morning and at bedtime. For drainage 16 g 2   hydrochlorothiazide (HYDRODIURIL) 25 MG tablet Take 25 mg by mouth daily.     Immune Globulin, Human, (PRIVIGEN IV) Inject 30 mg into the vein every 30 (thirty) days.     ipratropium (ATROVENT) 0.03 % nasal spray PLACE 1 TO 2 SPRAYS INTO EACH NOSTRIL TWICE DAILY AS NEEDED FOR NASAL DRAINAGE. 30 mL 5   ipratropium-albuterol (DUONEB) 0.5-2.5 (3) MG/3ML SOLN Take 3 mLs by nebulization every 6 (six) hours as needed. 360 mL 5   levocetirizine (XYZAL) 5 MG tablet TAKE ONE TABLET BY MOUTH DAILY AS NEEDED FOR ALLERGIES 30 tablet 5   losartan (COZAAR) 50 MG tablet Take 1 tablet by mouth daily.     montelukast (SINGULAIR) 10 MG tablet TAKE ONE TABLET BY MOUTH AT BEDTIME 30 tablet 11   Olopatadine HCl 0.2 % SOLN Place 1 drop into both eyes daily as needed. 2.5 mL 5   omeprazole (PRILOSEC) 20 MG capsule Take 20 mg by mouth 2 (two) times daily.     predniSONE (DELTASONE) 10 MG tablet Take 1 tablet (10 mg total) by mouth daily with breakfast. Take prednisone 40mg  daily x 2 days, 30mg  daily x 2 days, 20mg  daily x 2 days and 10mg  daily x 2 days. 20 tablet 0   SPIRIVA RESPIMAT 1.25 MCG/ACT AERS INHALE TWO PUFFS INTO THE LUNGS DAILY 4 g 5   traZODone (DESYREL) 50 MG tablet Take 50 mg by mouth at bedtime.     vitamin C (ASCORBIC ACID) 500 MG tablet Take 500 mg by mouth daily.     Zinc 50 MG CAPS Take by mouth.     fluconazole (DIFLUCAN) 150 MG tablet Take 1 tablet at start and end of Avelox course second (Patient not taking: Reported on 11/02/2021) 2 tablet 0   Current Facility-Administered Medications  Medication Dose  Route Frequency Provider Last Rate Last Admin   tezepelumab-ekko (TEZSPIRE) 210 MG/1. syringe 210 mg  210 mg Subcutaneous Q28 days , DO   210 mg at 10/06/21 1356   Allergies: Allergies  Allergen Reactions   Doxycycline Rash   Cyclobenzaprine    Hydrocortisone  Breaks out in hives with topical cream.    Sulfamethoxazole-Trimethoprim Swelling   Amoxicillin-Pot Clavulanate Nausea Only   Codeine Rash    Other reaction(s): RASH   I reviewed her past medical history, social history, family history, and environmental history and no significant changes have been reported from her previous visit.  Review of Systems  Constitutional:  Negative for appetite change, chills, fever and unexpected weight change.  HENT:  Positive for congestion and rhinorrhea.   Eyes:  Negative for itching.  Respiratory:  Positive for cough, chest tightness, shortness of breath and wheezing.   Gastrointestinal:  Negative for abdominal pain.  Skin:  Negative for rash.  Neurological:  Negative for headaches.    Objective: Physical Exam Not obtained as encounter was done via telephone.   Previous notes and tests were reviewed.  I discussed the assessment and treatment plan with the patient. The patient was provided an opportunity to ask questions and all were answered. The patient agreed with the plan and demonstrated an understanding of the instructions. After visit summary/patient instructions available via  patient will pick up tomorrow during her Tezspire injection visit .   The patient was advised to call back or seek an in-person evaluation if the symptoms worsen or if the condition fails to improve as anticipated.  I provided 35 minutes of non-face-to-face time during this encounter.  It was my pleasure to participate in Searra Bagheri's care today. Please feel free to contact me with any questions or concerns.   Sincerely,  Wyline Mood, DO Allergy & Immunology  Allergy and Ast[facility]  [facility] office: [phone] [facility] office: [phone]

## 2021-11-02 NOTE — Assessment & Plan Note (Signed)
Past history - Patient used to follow with pulmonology as well. Had bronchiectasis on CT chest and failed to follow up. Still smoking 1.5 pack per day which is less than her previous of 3 packs per day.Normal alpha-1 level. Eosinophils 100, IgE 458. Interim history - increased symptoms since sick. Noted Tezspire doesn't last full 4 weeks, symptomatic 1 week prior.   Discussed smoking cessation and strongly encouraged to stop smoking.  START budesonide (Pulmicort) 0.5mg  nebulizer for 1-2 weeks until your breathing symptoms return to baseline.   Daily controller medication(s): continue Symbicort 2 puffs twice a day with spacer and rinse mouth afterwards.  Continue Spiriva 1.53mcg 2 puffs once a day.  Continue Tezspire injections every 4 weeks - okay to get tomorrow.   During upper respiratory infections/flares:   Start budesonide (Pulmicort) 0.5mg  nebulizer twice a day for 1-2 weeks until your breathing symptoms return to baseline.   Pretreat with albuterol 2 puffs or albuterol nebulizer.   If you need to use your albuterol nebulizer machine back to back within 15-30 minutes with no relief then please go to the ER/urgent care for further evaluation.   May use albuterol rescue inhaler 2 puffs or nebulizer every 4 to 6 hours as needed for shortness of breath, chest tightness, coughing, and wheezing. May use albuterol rescue inhaler 2 puffs 5 to 15 minutes prior to strenuous physical activities. Monitor frequency of use.   Get spirometry at next visit.

## 2021-11-02 NOTE — Telephone Encounter (Signed)
Pt scheduled for televisit today.

## 2021-11-02 NOTE — Assessment & Plan Note (Signed)
Worsening symptoms with discolored mucous. No fevers. Son was ill 1 week ago.  Given CVID history with asthma will treat with antibiotics and prednisone.   Take mucinex twice a day with plenty of water to loosen up the mucous.  Take prednisone 40mg  daily x 2 days, 30mg  daily x 2 days, 20mg  daily x 2 days and 10mg  daily x 2 days.  Start doxycycline 100mg  twice a day for 14 days.  See below for URI symptom control.

## 2021-11-02 NOTE — Telephone Encounter (Signed)
Coughing and sinus/chest congestion since last week. Blowing and coughing up yellow drainage. Sinus headaches and facial pain.  Would like something called out for sxs. Is due for her [employer]. Would like to know if it's ok to get it then? Is taking all meds prescribed. Please advise. Thanks

## 2021-11-03 ENCOUNTER — Telehealth: Payer: Self-pay | Admitting: *Deleted

## 2021-11-03 ENCOUNTER — Telehealth: Payer: Self-pay

## 2021-11-03 ENCOUNTER — Ambulatory Visit (INDEPENDENT_AMBULATORY_CARE_PROVIDER_SITE_OTHER): Payer: 59

## 2021-11-03 DIAGNOSIS — J4550 Severe persistent asthma, uncomplicated: Secondary | ICD-10-CM | POA: Diagnosis not present

## 2021-11-03 MED ORDER — FLUCONAZOLE 150 MG PO TABS: 150.0000 mg | ORAL_TABLET | Freq: Every day | ORAL | 0 refills | Status: DC

## 2021-11-03 NOTE — Telephone Encounter (Signed)
Sent in

## 2021-11-03 NOTE — Telephone Encounter (Signed)
L/m for patient to contact office to R/S her 726 appt for Tezspire for the previous week using sample to see how she will respond to 3 week dosing

## 2021-11-03 NOTE — Telephone Encounter (Signed)
-----   Message from Ellamae Sia, DO sent at 11/03/2021 10:11 AM EDT ----- Let's do that. Give the next injection in 3 weeks with sample dose. Thank you.  ----- Message ----- From: Devoria Glassing, CMA Sent: 11/03/2021   9:27 AM EDT To: Ellamae Sia, DO  Unfortunately her Ins is strict on dosing so no way to get approval. We could try sampling her to get her dosing closer and see how she responds Dabney  ----- Message ----- From: Ellamae Sia, DO Sent: 11/02/2021   4:27 PM EDT To: Devoria Glassing, CMA  Can tezspire be approved to be given every 3 weeks? Patient is having symptoms 1 week prior to her injections. Thanks.

## 2021-11-03 NOTE — Telephone Encounter (Signed)
Kristi Allen wants to know if we can send in diflucan for when she completes her antibiotic she states she always get a yeast infection. [facility] [address]. Thank you

## 2021-11-23 ENCOUNTER — Ambulatory Visit (INDEPENDENT_AMBULATORY_CARE_PROVIDER_SITE_OTHER): Payer: 59

## 2021-11-23 DIAGNOSIS — J4550 Severe persistent asthma, uncomplicated: Secondary | ICD-10-CM | POA: Diagnosis not present

## 2021-11-29 ENCOUNTER — Telehealth: Payer: Self-pay | Admitting: Allergy

## 2021-11-30 ENCOUNTER — Ambulatory Visit: Payer: 59 | Admitting: Allergy

## 2021-11-30 ENCOUNTER — Ambulatory Visit: Payer: 59

## 2021-12-05 ENCOUNTER — Other Ambulatory Visit: Payer: Self-pay | Admitting: Allergy

## 2021-12-05 ENCOUNTER — Other Ambulatory Visit: Payer: Self-pay | Admitting: *Deleted

## 2021-12-05 MED ORDER — ALBUTEROL SULFATE HFA 108 (90 BASE) MCG/ACT IN AERS: 2.0000 | INHALATION_SPRAY | RESPIRATORY_TRACT | 1 refills | Status: DC | PRN

## 2021-12-05 NOTE — Telephone Encounter (Signed)
Pt called and requesting a refill of albuterol- sent alb to [facility] [address]. The pharmacy can process as generic proair.   Pt informed.

## 2021-12-13 NOTE — Progress Notes (Addendum)
Follow Up Note  RE: Kristi Allen MRN: [id number] DOB: 1956/02/22 Date of Office Visit: 12/14/2021  Referring provider: List, Rosana Fret, [place] Primary care provider: List, Rosana Fret, [place]  Chief Complaint: Asthma  History of Present Illness: I had the pleasure of seeing Kristi Allen for a follow up visit at the [facility] [place] on 12/22/2021. She is a 66 y.o. 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 Her previous allergy office visit was on 11/02/2021 with Dr. Maudie Mercury via telemedicine. Today is a regular follow up visit.  Asthma-COPD overlap syndrome  Patient finished prednisone and antibiotics and feeling better. Still smoking 1.5 ppd. She has a lot of anxiety and this seems to help her.   Currently on Tezspire injections every 3 weeks now instead of 4 because she noted worsening symptoms when due for her injections.   Currently on Symbicort 131mcg 2 puffs BID, Spiriva 2 puffs once a day. Insurance seems to be paying for these 2 inhalers.   Using albuterol nebulizer 1-2 times per day which is less than before. Using albuterol HFA every 3 hours for the past 2 weeks but doesn't think it works as well as the other albuterol inhaler but the insurance won't pay for it.  Requesting new spacer.   Still coughing daily.   CVID  Getting IVIG home infusions with no issues. Last antibiotics was in June that was prescribed by [place].    Seasonal and perennial allergic rhinitis Taking Claritin, Singulair, Flonase and Atrovent daily. Minimal nosebleeds.  Gastroesophageal reflux disease Taking omeprazole 20mg  twice a day with good benefit.  Assessment and Plan: Kristi Allen is a 66 y.o. female with: Asthma-COPD overlap syndrome ([place]) Past history - Patient used to follow with pulmonology as well. Still smoking 1.5 pack per day which is less than her previous of 3 packs per day. Normal alpha-1 level. Eosinophils 100, IgE 458. Interim  history - last 2 weeks using albuterol more frequently. No prednisone since June. Discussed smoking cessation and strongly encouraged to stop smoking - still smoking 1.5 ppd. Start on vest therapy to help with the coughing/chest congestion. Manual CPT was considered, however due to patients advanced disease. She would not tolerate positioning and percussive clapping associated with manual CPT.   Sent in xopenex (levoalbuterol) as a rescue inhaler.  START budesonide (Pulmicort) 0.5mg  nebulizer for 1-2 weeks until your breathing symptoms return to baseline.  Daily controller medication(s): continue Symbicort 164mcg 2 puffs twice a day with spacer and rinse mouth afterwards. Continue Spiriva 1.27mcg 2 puffs once a day. Continue Tezspire injections every 3 weeks - given today. During upper respiratory infections/flares:  Start budesonide (Pulmicort) 0.5mg  nebulizer twice a day for 1-2 weeks until your breathing symptoms return to baseline.  Pretreat with albuterol 2 puffs or albuterol nebulizer.  If you need to use your albuterol nebulizer machine back to back within 15-30 minutes with no relief then please go to the ER/urgent care for further evaluation.  May use levoalbuterol rescue inhaler 2 puffs or nebulizer every 4 to 6 hours as needed for shortness of breath, chest tightness, coughing, and wheezing. May use levoalbuterol rescue inhaler 2 puffs 5 to 15 minutes prior to strenuous physical activities. Monitor frequency of use.  Get spirometry at next visit. Get CT chest - 2019 CT showed small nodules.  CVID (common variable immunodeficiency) ([place]) Past history - Patient was diagnosed with CVID prior to coming to our office and has been on  weekly Hizentra 7gm injections. She mentions skipping few doses at times due to inability to self-administer due to her coughing fits.  Interim history - last antibiotics in June. Stable with Privigen. Continue at home infusions once a month with Privigen -  prefers IVIG over SCIG. Keep track of infections. Get bloodwork every 3-4 months - get it drawn a few days before your infusions.   Seasonal and perennial allergic rhinitis Past history - 2021 bloodwork positive to cat, dog, grass, trees, ragweed.  Interim history - controlled. Take loratadine 10mg  daily in the morning.  Continue with Singulair (montelukast) 10mg  daily at night. Use Flonase (fluticasone) nasal spray 1 spray per nostril twice a day as needed for nasal congestion.  Use Atrovent (ipratropium) 0.03% 1-2 sprays per nostril twice a day as needed for runny nose/drainage. Nasal saline spray (i.e., Simply Saline) or nasal saline lavage (i.e., NeilMed) is recommended as needed and prior to medicated nasal sprays. Continue environmental control measures.  Gastroesophageal reflux disease Continue omeprazole 20mg  twice a day.  Continue lifestyle and dietary modifications.  Return in about 3 months (around 03/16/2022).  Meds ordered this encounter  Medications   levalbuterol (XOPENEX HFA) 45 MCG/ACT inhaler    Sig: Inhale 2 puffs into the lungs every 4 (four) hours as needed for wheezing or shortness of breath (coughing fits).    Dispense:  1 each    Refill:  2   Lab Orders         CBC with Differential/Platelet         Comprehensive metabolic panel         IgG, IgA, IgM         Sedimentation rate      Diagnostics: Spirometry:  Tracings reviewed. Her effort: It was hard to get consistent efforts and there is a question as to whether this reflects a maximal maneuver. FVC: 1.99L FEV1: 1.50L, 68% predicted FEV1/FVC ratio: 75% Interpretation: Spirometry consistent with possible restrictive disease.  Please see scanned spirometry results for details.  Medication List:  Current Outpatient Medications  Medication Sig Dispense Refill   albuterol (PROVENTIL) (2.5 MG/3ML) 0.083% nebulizer solution Take 3 mLs (2.5 mg total) by nebulization every 6 (six) hours as needed for  wheezing or shortness of breath. 75 mL 1   ALLERGY RELIEF 10 MG tablet TAKE ONE TABLET BY MOUTH DAILY 30 tablet 5   amLODipine (NORVASC) 10 MG tablet Take 10 mg by mouth daily.     atorvastatin (LIPITOR) 10 MG tablet Take 10 mg by mouth daily.     budesonide (PULMICORT) 0.5 MG/2ML nebulizer solution USE ONE VIAL IN NEBULIZER IN THE MORNING AND AT BEDTIME ONLY AS NEEDED 120 mL 5   budesonide-formoterol (SYMBICORT) 160-4.5 MCG/ACT inhaler INHALE TWO PUFFS BY MOUTH EVERY MORNING AND AT BEDTIME. USE WITH SPACER AND RINSE MOUTH AFTERWARDS 10.2 g 1   buPROPion (WELLBUTRIN XL) 150 MG 24 hr tablet Take 1 tablet by mouth daily.     busPIRone (BUSPAR) 10 MG tablet Take by mouth.     Calcium Carb-Cholecalciferol (CALCIUM CARBONATE-VITAMIN D3) 600-400 MG-UNIT TABS TAKE ONE TABLET BY MOUTH 2 TIMES A DAY     Cholecalciferol (VITAMIN D3) 50 MCG (2000 UT) TABS Take by mouth.     EPINEPHrine (EPIPEN 2-PAK) 0.3 mg/0.3 mL IJ SOAJ injection Use as needed for severe allergic reactions. 2 each 3   fluticasone (FLONASE) 50 MCG/ACT nasal spray Place 1 spray into both nostrils in the morning and at bedtime. For drainage 16 g 2  hydrochlorothiazide (HYDRODIURIL) 25 MG tablet Take 25 mg by mouth daily.     Immune Globulin, Human, (PRIVIGEN IV) Inject 30 mg into the vein every 30 (thirty) days.     ipratropium (ATROVENT) 0.03 % nasal spray PLACE 1 TO 2 SPRAYS INTO EACH NOSTRIL TWICE DAILY AS NEEDED FOR NASAL DRAINAGE. 30 mL 5   ipratropium-albuterol (DUONEB) 0.5-2.5 (3) MG/3ML SOLN Take 3 mLs by nebulization every 6 (six) hours as needed. 360 mL 5   levalbuterol (XOPENEX HFA) 45 MCG/ACT inhaler Inhale 2 puffs into the lungs every 4 (four) hours as needed for wheezing or shortness of breath (coughing fits). 1 each 2   levocetirizine (XYZAL) 5 MG tablet TAKE ONE TABLET BY MOUTH DAILY AS NEEDED FOR ALLERGIES 30 tablet 5   losartan (COZAAR) 50 MG tablet Take 1 tablet by mouth daily.     montelukast (SINGULAIR) 10 MG tablet TAKE  ONE TABLET BY MOUTH AT BEDTIME 30 tablet 11   Olopatadine HCl 0.2 % SOLN Place 1 drop into both eyes daily as needed. 2.5 mL 5   omeprazole (PRILOSEC) 20 MG capsule Take 20 mg by mouth 2 (two) times daily.     SPIRIVA RESPIMAT 1.25 MCG/ACT AERS INHALE TWO PUFFS INTO THE LUNGS DAILY 4 g 5   traZODone (DESYREL) 50 MG tablet Take 50 mg by mouth at bedtime.     vitamin C (ASCORBIC ACID) 500 MG tablet Take 500 mg by mouth daily.     Zinc 50 MG CAPS Take by mouth.     Current Facility-Administered Medications  Medication Dose Route Frequency Provider Last Rate Last Admin   tezepelumab-ekko (TEZSPIRE) 210 MG/1. syringe 210 mg  210 mg Subcutaneous Q28 days Ellamae Sia, DO   210 mg at 12/14/21 1136   Allergies: Allergies  Allergen Reactions   Doxycycline Rash   Cyclobenzaprine    Hydrocortisone     Breaks out in hives with topical cream.    Sulfamethoxazole-Trimethoprim Swelling   Amoxicillin-Pot Clavulanate Nausea Only   Codeine Rash    Other reaction(s): RASH   I reviewed her past medical history, social history, family history, and environmental history and no significant changes have been reported from her previous visit.  Review of Systems  Constitutional:  Negative for appetite change, chills, fever and unexpected weight change.  HENT:  Negative for congestion.   Eyes:  Negative for itching.  Respiratory:  Positive for cough, chest tightness, shortness of breath and wheezing.   Gastrointestinal:  Negative for abdominal pain.  Skin:  Negative for rash.  Neurological:  Negative for headaches.    Objective: BP (!) 118/54 (BP Location: Left Arm, Patient Position: Sitting, Cuff Size: Normal)   Pulse 80   Temp 98.2 F (36.8 C) (Temporal)   Resp 16   SpO2 98%  There is no height or weight on file to calculate BMI. Physical Exam Vitals and nursing note reviewed.  Constitutional:      Appearance: Normal appearance. She is well-developed.  HENT:     Head: Normocephalic and  atraumatic.     Right Ear: Tympanic membrane and external ear normal.     Left Ear: Tympanic membrane and external ear normal.     Nose: Nose normal.     Mouth/Throat:     Mouth: Mucous membranes are moist.     Pharynx: Oropharynx is clear.  Eyes:     Conjunctiva/sclera: Conjunctivae normal.  Cardiovascular:     Rate and Rhythm: Normal rate and regular rhythm.  Heart sounds: Normal heart sounds. No murmur heard. Pulmonary:     Effort: Pulmonary effort is normal.     Breath sounds: Wheezing (slight wheezing noted on upper lobes.) present. No rhonchi or rales.  Musculoskeletal:     Cervical back: Neck supple.  Skin:    General: Skin is warm.     Findings: No rash.  Neurological:     Mental Status: She is alert and oriented to person, place, and time.  Psychiatric:        Behavior: Behavior normal.   Previous notes and tests were reviewed. The plan was reviewed with the patient/family, and all questions/concerned were addressed.  It was my pleasure to see Kristi Allen today and participate in her care. Please feel free to contact me with any questions or concerns.  Sincerely,  Wyline Mood, DO Allergy & Immunology  [facility] office: [phone] [facility] office: [phone]

## 2021-12-14 ENCOUNTER — Encounter: Payer: Self-pay | Admitting: Allergy

## 2021-12-14 ENCOUNTER — Ambulatory Visit: Payer: 59

## 2021-12-14 ENCOUNTER — Ambulatory Visit (INDEPENDENT_AMBULATORY_CARE_PROVIDER_SITE_OTHER): Payer: 59 | Admitting: Allergy

## 2021-12-14 VITALS — BP 118/54 | HR 80 | Temp 98.2°F | Resp 16

## 2021-12-14 DIAGNOSIS — J302 Other seasonal allergic rhinitis: Secondary | ICD-10-CM

## 2021-12-14 DIAGNOSIS — J4550 Severe persistent asthma, uncomplicated: Secondary | ICD-10-CM

## 2021-12-14 DIAGNOSIS — J3089 Other allergic rhinitis: Secondary | ICD-10-CM

## 2021-12-14 DIAGNOSIS — D839 Common variable immunodeficiency, unspecified: Secondary | ICD-10-CM

## 2021-12-14 DIAGNOSIS — Z720 Tobacco use: Secondary | ICD-10-CM

## 2021-12-14 DIAGNOSIS — K219 Gastro-esophageal reflux disease without esophagitis: Secondary | ICD-10-CM

## 2021-12-14 DIAGNOSIS — J449 Chronic obstructive pulmonary disease, unspecified: Secondary | ICD-10-CM

## 2021-12-14 MED ORDER — LEVALBUTEROL TARTRATE 45 MCG/ACT IN AERO: 2.0000 | INHALATION_SPRAY | RESPIRATORY_TRACT | 2 refills | Status: DC | PRN

## 2021-12-14 NOTE — Assessment & Plan Note (Signed)
Past history - Patient was diagnosed with CVID prior to coming to our office and has been on weekly Hizentra 7gm injections. She mentions skipping few doses at times due to inability to self-administer due to her coughing fits.  Interim history - last antibiotics in June. Stable with Privigen.  Continue at home infusions once a month with Privigen - prefers IVIG over SCIG.  Keep track of infections.  Get bloodwork every 3-4 months - get it drawn a few days before your infusions.

## 2021-12-14 NOTE — Assessment & Plan Note (Addendum)
Past history - Patient used to follow with pulmonology as well. Still smoking 1.5 pack per day which is less than her previous of 3 packs per day. Normal alpha-1 level. Eosinophils 100, IgE 458. Interim history - last 2 weeks using albuterol more frequently. No prednisone since June.  Discussed smoking cessation and strongly encouraged to stop smoking - still smoking 1.5 ppd.  Start on vest therapy to help with the coughing/chest congestion.  Manual CPT was considered, however due to patients advanced disease. She would not tolerate positioning and percussive clapping associated with manual CPT.    Sent in xopenex (levoalbuterol) as a rescue inhaler.   START budesonide (Pulmicort) 0.5mg  nebulizer for 1-2 weeks until your breathing symptoms return to baseline.   Daily controller medication(s): continue Symbicort 2 puffs twice a day with spacer and rinse mouth afterwards.  Continue Spiriva 1.52mcg 2 puffs once a day.  Continue Tezspire injections every 3 weeks - given today.  During upper respiratory infections/flares:   Start budesonide (Pulmicort) 0.5mg  nebulizer twice a day for 1-2 weeks until your breathing symptoms return to baseline.   Pretreat with albuterol 2 puffs or albuterol nebulizer.   If you need to use your albuterol nebulizer machine back to back within 15-30 minutes with no relief then please go to the ER/urgent care for further evaluation.   May use levoalbuterol rescue inhaler 2 puffs or nebulizer every 4 to 6 hours as needed for shortness of breath, chest tightness, coughing, and wheezing. May use levoalbuterol rescue inhaler 2 puffs 5 to 15 minutes prior to strenuous physical activities. Monitor frequency of use.   Get spirometry at next visit.  Get CT chest - 2019 CT showed small nodules.

## 2021-12-14 NOTE — Assessment & Plan Note (Signed)
   Continue omeprazole 20mg  twice a day.  Continue lifestyle and dietary modifications.

## 2021-12-14 NOTE — Assessment & Plan Note (Signed)
Past history - 2021 bloodwork positive to cat, dog, grass, trees, ragweed.  Interim history - controlled.  Take loratadine 10mg  daily in the morning.   Continue with Singulair (montelukast) 10mg  daily at night.  Use Flonase (fluticasone) nasal spray 1 spray per nostril twice a day as needed for nasal congestion.   Use Atrovent (ipratropium) 0.03% 1-2 sprays per nostril twice a day as needed for runny nose/drainage.  Nasal saline spray (i.e., Simply Saline) or nasal saline lavage (i.e., NeilMed) is recommended as needed and prior to medicated nasal sprays.  Continue environmental control measures.

## 2021-12-14 NOTE — Patient Instructions (Addendum)
CVID (common variable immunodeficiency)  Continue at home infusions once a month with Privigen. Keep track of infections. Get bloodwork every 3 months - get it drawn a few days before your infusions.    Asthma-COPD overlap syndrome  Start on vest therapy - I will submit paperwork for this.  Sent in xopenex (levoalbuterol) as a rescue inhaler. See if it covers.   DECREASE smoking. START budesonide (Pulmicort) 0.5mg  nebulizer for 1-2 weeks until your breathing symptoms return to baseline.  Daily controller medication(s): continue Symbicort 2 puffs twice a day with spacer and rinse mouth afterwards. Continue Spiriva 1.40mcg 2 puffs once a day. Continue Tezspire injections every 4 weeks.  During upper respiratory infections/flares:  Start budesonide (Pulmicort) 0.5mg  nebulizer twice a day for 1-2 weeks until your breathing symptoms return to baseline.  Pretreat with albuterol 2 puffs or albuterol nebulizer.  If you need to use your albuterol nebulizer machine back to back within 15-30 minutes with no relief then please go to the ER/urgent care for further evaluation.  May use levoalbuterol rescue inhaler 2 puffs or nebulizer every 4 to 6 hours as needed for shortness of breath, chest tightness, coughing, and wheezing. May use levoalbuterol rescue inhaler 2 puffs 5 to 15 minutes prior to strenuous physical activities. Monitor frequency of use.  Asthma control goals:  Full participation in all desired activities (may need albuterol before activity) Albuterol use two times or less a week on average (not counting use with activity) Cough interfering with sleep two times or less a month Oral steroids no more than once a year No hospitalizations    Seasonal and perennial allergic rhinitis 2021 bloodwork positive to cat, dog, grass, trees, ragweed.  Take loratadine 10mg  daily in the morning.  Continue with Singulair (montelukast) 10mg  daily at night. Use Flonase (fluticasone) nasal spray 1  spray per nostril twice a day as needed for nasal congestion.  Use Atrovent (ipratropium) 0.03% 1-2 sprays per nostril twice a day as needed for runny nose/drainage. Nasal saline spray (i.e., Simply Saline) or nasal saline lavage (i.e., NeilMed) is recommended as needed and prior to medicated nasal sprays. Continue environmental control measures.   Gastroesophageal reflux disease Continue lifestyle and dietary modifications. Continue omeprazole 20mg  twice a day.   Follow up in 3 months or sooner if needed with Dr. .  After September 2023, I will not be in the [facility] office on a regular basis. You can continue your care and follow up with Dr. Marlynn Perking, Dr. 09-24-1984 or the nurse practitioners [facility] or Maurine Minister) in [facility]. OR you can follow up with me in our [place] ([address]) or [place] [phone]) location.   Sincerely,  [place], DO  [facility] office: [phone] [facility] office: [phone] [place] office: [phone]

## 2021-12-15 NOTE — Telephone Encounter (Signed)
Spoke to Kristi Allen to let her know that we had to fill out a form through [employer] for a pre certification for her chest ct. Patient has  medicare. We had to fax the form to [employer] and waiting on a response. Will keep the patient in form. Patient had a previous chest ct in 1993. Dr. Selena Batten wants to see if there is any change.

## 2021-12-19 ENCOUNTER — Telehealth: Payer: Self-pay

## 2021-12-19 NOTE — Telephone Encounter (Signed)
Documentation has been faxed over to [facility]. Vest is pending approval.

## 2021-12-20 LAB — CBC WITH DIFFERENTIAL/PLATELET
Basophils Absolute: 0 10*3/uL (ref 0.0–0.2)
Basos: 1 %
EOS (ABSOLUTE): 0.1 10*3/uL (ref 0.0–0.4)
Eos: 1 %
Hematocrit: 44.3 % (ref 34.0–46.6)
Hemoglobin: 14.6 g/dL (ref 11.1–15.9)
Immature Grans (Abs): 0 10*3/uL (ref 0.0–0.1)
Immature Granulocytes: 0 %
Lymphocytes Absolute: 2.7 10*3/uL (ref 0.7–3.1)
Lymphs: 30 %
MCH: 29.8 pg (ref 26.6–33.0)
MCHC: 33 g/dL (ref 31.5–35.7)
MCV: 90 fL (ref 79–97)
Monocytes Absolute: 0.6 10*3/uL (ref 0.1–0.9)
Monocytes: 7 %
Neutrophils Absolute: 5.5 10*3/uL (ref 1.4–7.0)
Neutrophils: 61 %
Platelets: 325 10*3/uL (ref 150–450)
RBC: 4.9 x10E6/uL (ref 3.77–5.28)
RDW: 13.4 % (ref 11.7–15.4)
WBC: 8.9 10*3/uL (ref 3.4–10.8)

## 2021-12-20 LAB — COMPREHENSIVE METABOLIC PANEL
ALT: 15 IU/L (ref 0–32)
AST: 14 IU/L (ref 0–40)
Albumin/Globulin Ratio: 1.7 (ref 1.2–2.2)
Albumin: 4.3 g/dL (ref 3.9–4.9)
Alkaline Phosphatase: 106 IU/L (ref 44–121)
BUN/Creatinine Ratio: 16 (ref 12–28)
BUN: 13 mg/dL (ref 8–27)
Bilirubin Total: 0.4 mg/dL (ref 0.0–1.2)
CO2: 23 mmol/L (ref 20–29)
Calcium: 9.5 mg/dL (ref 8.7–10.3)
Chloride: 102 mmol/L (ref 96–106)
Creatinine, Ser: 0.82 mg/dL (ref 0.57–1.00)
Globulin, Total: 2.6 g/dL (ref 1.5–4.5)
Glucose: 89 mg/dL (ref 70–99)
Potassium: 4.2 mmol/L (ref 3.5–5.2)
Sodium: 142 mmol/L (ref 134–144)
Total Protein: 6.9 g/dL (ref 6.0–8.5)
eGFR: 79 mL/min/{1.73_m2} (ref >59)

## 2021-12-20 LAB — SEDIMENTATION RATE: Sed Rate: 37 mm/hr (ref 0–40)

## 2021-12-20 LAB — IGG, IGA, IGM
IgA/Immunoglobulin A, Serum: 144 mg/dL (ref 87–352)
IgG (Immunoglobin G), Serum: 1030 mg/dL (ref 586–1602)
IgM (Immunoglobulin M), Srm: 68 mg/dL (ref 26–217)

## 2021-12-20 NOTE — Telephone Encounter (Signed)
Dr. Tedra Senegal asked about you wanting her to wear a vest. Please advise? I also told patient she didn't need a PA for her chest ct per Luisa Hart with [place]  [facility] so hopefully we can get that scheduled for this week. Left message for patient to call me so we can schedule.

## 2021-12-20 NOTE — Telephone Encounter (Signed)
The vest therapy is to help her with mucous clearance in the lungs. She is chronically coughing and has mucous.

## 2021-12-20 NOTE — Telephone Encounter (Signed)
Patient calling regarding her lab results. I did tell patient her labs were normal, but Kristi Allen will note that under result management. Patient also wanted me to check on wearing a vest and told her I will check with Dr. Selena Batten. I told patient will check today regarding her chest ct whether her ct has been approved.

## 2021-12-20 NOTE — Telephone Encounter (Signed)
I am faxing an order form for you to sign and please have one of the cma's fax the order form. Latoya at radiology will call patient to schedule. Will ask Dee to see if she working on ordering the vest.

## 2021-12-21 NOTE — Telephone Encounter (Signed)
Spoke to Kristi Allen to let her know that the radiology department should be calling her to schedule her ct chest. Patient did not need a PA.Dimples also aware of the vest.

## 2021-12-21 NOTE — Telephone Encounter (Signed)
CT chest order signed.

## 2021-12-22 NOTE — Telephone Encounter (Signed)
Noted was addended. Please resubmit. Thank you!

## 2021-12-22 NOTE — Telephone Encounter (Signed)
[employer] Vest rep came in stating that note needs addendum to include this information:   Pt has tried and failed flutter valve as it did not effectively mobilize secretions. OR  Manual CPT was considered, however due to patients advanced disease. She would not tolerate positioning and percussive clapping associated with manual CPT.   Per Maralyn Sago the rep: "Once we receive this updated note, we can submit for auth through the patients insurance plan. Please call with any questions. [phone]." Fax number is [phone]

## 2021-12-26 ENCOUNTER — Telehealth: Payer: Self-pay | Admitting: Allergy

## 2021-12-26 MED ORDER — LEVOCETIRIZINE DIHYDROCHLORIDE 5 MG PO TABS: ORAL_TABLET | ORAL | 11 refills | Status: DC

## 2021-12-26 NOTE — Telephone Encounter (Signed)
Rx sent to [facility] pharmacy on file.

## 2021-12-26 NOTE — Telephone Encounter (Signed)
Patient is requesting a refill on RX levocetirizine (XYZAL) 5 MG tablet [[id number]]  please advise

## 2022-01-04 ENCOUNTER — Ambulatory Visit (INDEPENDENT_AMBULATORY_CARE_PROVIDER_SITE_OTHER): Payer: 59

## 2022-01-04 ENCOUNTER — Telehealth: Payer: Self-pay | Admitting: Allergy

## 2022-01-04 DIAGNOSIS — J4550 Severe persistent asthma, uncomplicated: Secondary | ICD-10-CM

## 2022-01-04 NOTE — Telephone Encounter (Signed)
Spoke with Latoya at [facility]. Letting her know that our pt.Deanne hasn't been called to be scheduled to get her chest ct scan. Spoke to Dayjah to let her know Glee Arvin should be calling her to schedule her chest ct. If she hasn't heard from them in a couple of days please call our office to let us know.

## 2022-01-04 NOTE — Telephone Encounter (Signed)
Patient stating Dr. Selena Batten was supposed to set her up a Chest Xray but has not heard anything please advise

## 2022-01-05 ENCOUNTER — Other Ambulatory Visit: Payer: Self-pay

## 2022-01-05 ENCOUNTER — Other Ambulatory Visit: Payer: Self-pay | Admitting: Allergy

## 2022-01-05 MED ORDER — ALBUTEROL SULFATE HFA 108 (90 BASE) MCG/ACT IN AERS: INHALATION_SPRAY | RESPIRATORY_TRACT | 1 refills | Status: DC

## 2022-01-05 MED ORDER — LORATADINE 10 MG PO TABS: 10.0000 mg | ORAL_TABLET | Freq: Every day | ORAL | 5 refills | Status: DC

## 2022-01-05 NOTE — Telephone Encounter (Signed)
Refilled patient's allergy relief and albuterol hfa medications to [facility] on [address]. Patient also said she will be getting her chest ct tomorrow.

## 2022-01-10 ENCOUNTER — Telehealth: Payer: Self-pay | Admitting: Allergy

## 2022-01-10 NOTE — Telephone Encounter (Signed)
Please call patient.  I received her CT results - she does have changes in her lungs which are consistent with airway inflammation.   She also has some central bronchiectasis - this just means stiffening in her lungs.   Recommend to continue all her inhalers, decrease smoking and do the vest therapy.  She needs a follow up scheduled with Dr. Marlynn Perking in [facility] in November.  If her breathing symptoms worsen - may need to see pulmonology again.  Thank you.

## 2022-01-10 NOTE — Telephone Encounter (Signed)
Lm for pt to call us back about this °

## 2022-01-11 NOTE — Telephone Encounter (Signed)
Pt informed and wanted to know what the stiffening and inflammation ment she would like for you to call her

## 2022-01-11 NOTE — Telephone Encounter (Signed)
Spoke with patient regarding the CT chest.  Strongly encouraged smoking cessation.  She is already on maximal therapy with IVIG, inhalers, asthma biologic and vest therapy.

## 2022-01-25 ENCOUNTER — Ambulatory Visit (INDEPENDENT_AMBULATORY_CARE_PROVIDER_SITE_OTHER): Payer: 59

## 2022-01-25 DIAGNOSIS — J4550 Severe persistent asthma, uncomplicated: Secondary | ICD-10-CM | POA: Diagnosis not present

## 2022-02-15 ENCOUNTER — Ambulatory Visit (INDEPENDENT_AMBULATORY_CARE_PROVIDER_SITE_OTHER): Payer: 59

## 2022-02-15 DIAGNOSIS — J4550 Severe persistent asthma, uncomplicated: Secondary | ICD-10-CM

## 2022-02-21 ENCOUNTER — Other Ambulatory Visit: Payer: Self-pay | Admitting: Allergy

## 2022-02-27 ENCOUNTER — Other Ambulatory Visit: Payer: Self-pay | Admitting: Allergy

## 2022-03-07 ENCOUNTER — Telehealth: Payer: Self-pay | Admitting: Allergy

## 2022-03-07 MED ORDER — FLUTICASONE PROPIONATE 50 MCG/ACT NA SUSP: 1.0000 | Freq: Two times a day (BID) | NASAL | 5 refills | Status: DC

## 2022-03-07 MED ORDER — LEVOCETIRIZINE DIHYDROCHLORIDE 5 MG PO TABS: ORAL_TABLET | ORAL | 5 refills | Status: DC

## 2022-03-07 NOTE — Telephone Encounter (Addendum)
Sent in xyzal 5 mg and flonase to the mail in [employer].

## 2022-03-07 NOTE — Telephone Encounter (Signed)
Pt request a refill for levocitirizine and flonase

## 2022-03-08 ENCOUNTER — Ambulatory Visit (INDEPENDENT_AMBULATORY_CARE_PROVIDER_SITE_OTHER): Payer: 59

## 2022-03-08 ENCOUNTER — Other Ambulatory Visit: Payer: Self-pay

## 2022-03-08 DIAGNOSIS — J4550 Severe persistent asthma, uncomplicated: Secondary | ICD-10-CM | POA: Diagnosis not present

## 2022-03-08 DIAGNOSIS — J4551 Severe persistent asthma with (acute) exacerbation: Secondary | ICD-10-CM

## 2022-03-08 MED ORDER — METHYLPREDNISOLONE ACETATE 80 MG/ML IJ SUSP
80.0000 mg | Freq: Once | INTRAMUSCULAR | Status: AC
  Administered 2022-03-08: 80 mg via INTRAMUSCULAR

## 2022-03-08 MED ORDER — CETIRIZINE HCL 10 MG PO TABS: ORAL_TABLET | ORAL | 5 refills | Status: DC

## 2022-03-08 NOTE — Progress Notes (Signed)
Follow Up Note  RE: Soma Gentle MRN: [id number] DOB: [birth year] Date of Office Visit: 03/08/2022  Referring provider: List, Rosana Fret, [place] Primary care provider: List, Rosana Fret, FNP  Chief Complaint: No chief complaint on file.  History of Present Illness: I had the pleasure of seeing Kristi Allen for a follow up visit at the [facility] on 03/08/2022. She is a 66 y.o. female, who is being followed for concern for asthma/COPD on test prior, C VID on IVIG, allergic rhinitis and GERD. Her previous allergy office visit was on  12/14/21 with Dr. Maudie Mercury.  History obtained from patient, chart review.  Patient received her test by her injection today and return to clinic approximately 20 minutes after injection complaining of worsening cough, wheezing, dizziness, flushing, watery eyes.  She does report she has been having increasing coughing spells similar to this prior to her test by her injection but it came on acutely while she was waiting in her car for the 30-minute wait.  Denies any increased rhinorrhea, urticaria or pruritus, postnasal drip, nausea vomiting, uterine contractions, lightheadedness currently.  She did take 2 puffs of albuterol in the car.  This helped a little bit but did not fully alleviate symptoms.  Assessment and Plan: [name] is a 66 y.o. female with: Severe persistent asthma, unspecified whether complicated  Severe persistent asthma with (acute) exacerbation - Plan: methylPREDNISolone acetate (DEPO-MEDROL) injection 80 mg Plan:  Initial concern for possible IgE mediated reaction to test prior injection.  However on further evaluation and more concerned with worsening asthma control.  She was given additional 4 puffs of albuterol, Zyrtec 10 mg p.o. and Solu-Medrol 80 mg IM.  She was sent home on Prednisone 10mg  tablet pack: 2 tablets given in office today. Take 2 more tablets before bed today.  Then take 2 tablets twice a day for 2 more days. Then take 2  tablets once a day for 1 day. Then take 1 tablet once a day for 1 day.    She was monitored for 45 minutes  She was discharged home stable  Follow-up in clinic on Tuesday at 65 with myself  Lab Orders  No laboratory test(s) ordered today   Diagnostics: None done    Medication List:  Current Outpatient Medications  Medication Sig Dispense Refill   albuterol (PROVENTIL) (2.5 MG/3ML) 0.083% nebulizer solution Take 3 mLs (2.5 mg total) by nebulization every 6 (six) hours as needed for wheezing or shortness of breath. 75 mL 1   albuterol (VENTOLIN HFA) 108 (90 Base) MCG/ACT inhaler INHALE 2 PUFFS INTO THE LUNGS EVERY 4 HOURS AS NEEDED FOR WHEEZING OR SHORTNESS OF BREATH 8.5 g 1   amLODipine (NORVASC) 10 MG tablet Take 10 mg by mouth daily.     atorvastatin (LIPITOR) 10 MG tablet Take 10 mg by mouth daily.     budesonide (PULMICORT) 0.5 MG/2ML nebulizer solution USE ONE VIAL IN NEBULIZER IN THE MORNING AND AT BEDTIME ONLY AS NEEDED 120 mL 5   budesonide-formoterol (SYMBICORT) 160-4.5 MCG/ACT inhaler INHALE TWO PUFFS BY MOUTH EVERY MORNING AND AT BEDTIME. USE WITH SPACER AND RINSE MOUTH AFTERWARDS 10.2 g 1   buPROPion (WELLBUTRIN XL) 150 MG 24 hr tablet Take 1 tablet by mouth daily.     busPIRone (BUSPAR) 10 MG tablet Take by mouth.     Calcium Carb-Cholecalciferol (CALCIUM CARBONATE-VITAMIN D3) 600-400 MG-UNIT TABS TAKE ONE TABLET BY MOUTH 2 TIMES A DAY     Cholecalciferol (VITAMIN D3) 50 MCG (2000 UT)  TABS Take by mouth.     EPINEPHrine (EPIPEN 2-PAK) 0.3 mg/0.3 mL IJ SOAJ injection Use as needed for severe allergic reactions. 2 each 3   fluticasone (FLONASE) 50 MCG/ACT nasal spray Place 1 spray into both nostrils in the morning and at bedtime. For drainage 16 g 5   hydrochlorothiazide (HYDRODIURIL) 25 MG tablet Take 25 mg by mouth daily.     Immune Globulin, Human, (PRIVIGEN IV) Inject 30 mg into the vein every 30 (thirty) days.     ipratropium (ATROVENT) 0.03 % nasal spray PLACE 1 TO 2  SPRAYS INTO EACH NOSTRIL TWICE DAILY AS NEEDED FOR NASAL DRAINAGE. 30 mL 5   ipratropium-albuterol (DUONEB) 0.5-2.5 (3) MG/3ML SOLN Take 3 mLs by nebulization every 6 (six) hours as needed. 360 mL 5   levalbuterol (XOPENEX HFA) 45 MCG/ACT inhaler Inhale 2 puffs into the lungs every 4 (four) hours as needed for wheezing or shortness of breath (coughing fits). 1 each 2   levocetirizine (XYZAL) 5 MG tablet TAKE ONE TABLET BY MOUTH DAILY AS NEEDED FOR ALLERGIES 30 tablet 5   loratadine (ALLERGY RELIEF) 10 MG tablet Take 1 tablet (10 mg total) by mouth daily. 30 tablet 5   losartan (COZAAR) 50 MG tablet Take 1 tablet by mouth daily.     montelukast (SINGULAIR) 10 MG tablet TAKE ONE TABLET BY MOUTH AT BEDTIME 30 tablet 11   Olopatadine HCl 0.2 % SOLN Place 1 drop into both eyes daily as needed. 2.5 mL 5   omeprazole (PRILOSEC) 20 MG capsule Take 20 mg by mouth 2 (two) times daily.     SPIRIVA RESPIMAT 1.25 MCG/ACT AERS INHALE TWO PUFFS INTO THE LUNGS DAILY 4 g 5   traZODone (DESYREL) 50 MG tablet Take 50 mg by mouth at bedtime.     vitamin C (ASCORBIC ACID) 500 MG tablet Take 500 mg by mouth daily.     Zinc 50 MG CAPS Take by mouth.     Current Facility-Administered Medications  Medication Dose Route Frequency Provider Last Rate Last Admin   tezepelumab-ekko (TEZSPIRE) 210 MG/1.91ML syringe 210 mg  210 mg Subcutaneous Q28 days Garnet Sierras, DO   210 mg at 03/08/22 1407   Allergies: Allergies  Allergen Reactions   Doxycycline Rash   Cyclobenzaprine    Hydrocortisone     Breaks out in hives with topical cream.    Sulfamethoxazole-Trimethoprim Swelling   Amoxicillin-Pot Clavulanate Nausea Only   Codeine Rash    Other reaction(s): RASH   I reviewed her past medical history, social history, family history, and environmental history and no significant changes have been reported from her previous visit.  ROS: All others negative except as noted per HPI.   Objective: There were no vitals taken  for this visit. There is no height or weight on file to calculate BMI. General Appearance:  Alert, cooperative, no distress, appears stated age  Head:  Normocephalic, without obvious abnormality, atraumatic  Eyes:  Conjunctiva clear, EOM's intact  Nose: Nares normal, hypertrophic turbinates, no visible anterior polyps, and septum midline  Throat: Lips, tongue normal; teeth and gums normal, normal posterior oropharynx and no tonsillar exudate  Neck: Supple, symmetrical  Lungs:   end-expiratory wheezing, Respirations unlabored, intermittent dry coughing  Heart:  Tachycardic and no murmur, Appears well perfused  Extremities: No edema  Skin: Skin color, texture, turgor normal, no rashes or lesions on visualized portions of skin   Neurologic: No gross deficits   Previous notes and tests were reviewed. The  plan was reviewed with the patient/family, and all questions/concerned were addressed.  It was my pleasure to see Kristi Allen today and participate in her care. Please feel free to contact me with any questions or concerns.  Sincerely,  Roney Marion, MD  Allergy & Immunology  [facility] [place] [facility] Office: [phone]

## 2022-03-08 NOTE — Addendum Note (Signed)
Addended by: Lenoard Aden on: 03/08/2022 04:53 PM   Modules accepted: Level of Service

## 2022-03-08 NOTE — Addendum Note (Signed)
Addended by: Gerre Pebbles A on: 03/08/2022 03:51 PM   Modules accepted: Orders

## 2022-03-14 ENCOUNTER — Ambulatory Visit (INDEPENDENT_AMBULATORY_CARE_PROVIDER_SITE_OTHER): Payer: 59 | Admitting: Internal Medicine

## 2022-03-14 ENCOUNTER — Encounter: Payer: Self-pay | Admitting: Internal Medicine

## 2022-03-14 VITALS — BP 136/66 | HR 70 | Temp 98.0°F | Resp 18

## 2022-03-14 DIAGNOSIS — J4489 Other specified chronic obstructive pulmonary disease: Secondary | ICD-10-CM | POA: Diagnosis not present

## 2022-03-14 DIAGNOSIS — J470 Bronchiectasis with acute lower respiratory infection: Secondary | ICD-10-CM

## 2022-03-14 DIAGNOSIS — J302 Other seasonal allergic rhinitis: Secondary | ICD-10-CM

## 2022-03-14 DIAGNOSIS — J3089 Other allergic rhinitis: Secondary | ICD-10-CM | POA: Diagnosis not present

## 2022-03-14 DIAGNOSIS — D839 Common variable immunodeficiency, unspecified: Secondary | ICD-10-CM

## 2022-03-14 DIAGNOSIS — K219 Gastro-esophageal reflux disease without esophagitis: Secondary | ICD-10-CM | POA: Diagnosis not present

## 2022-03-14 MED ORDER — AMOXICILLIN-POT CLAVULANATE 875-125 MG PO TABS: 1.0000 | ORAL_TABLET | Freq: Two times a day (BID) | ORAL | 0 refills | Status: AC

## 2022-03-14 MED ORDER — SPIRIVA RESPIMAT 1.25 MCG/ACT IN AERS: INHALATION_SPRAY | RESPIRATORY_TRACT | 5 refills | Status: DC

## 2022-03-14 MED ORDER — BUDESONIDE 0.5 MG/2ML IN SUSP: RESPIRATORY_TRACT | 5 refills | Status: DC

## 2022-03-14 MED ORDER — BUDESONIDE-FORMOTEROL FUMARATE 160-4.5 MCG/ACT IN AERO: INHALATION_SPRAY | RESPIRATORY_TRACT | 5 refills | Status: DC

## 2022-03-14 MED ORDER — FLUCONAZOLE 150 MG PO TABS: 150.0000 mg | ORAL_TABLET | Freq: Once | ORAL | 0 refills | Status: AC

## 2022-03-14 NOTE — Progress Notes (Signed)
Follow Up Note  RE: Kristi Allen MRN: [id number] DOB: Dec 10, 1955 Date of Office Visit: 03/14/2022  Referring provider: List, Rosana Fret, [place] Primary care provider: List, Rosana Fret, FNP  Chief Complaint: No chief complaint on file.  History of Present Illness: I had the pleasure of seeing Kristi Allen for a follow up visit at the [facility] [place] on 03/15/2022. She is a 66 y.o. female, who is being followed for asthma/COPD on Tezspire, CVID on IVIG, allergic rhinitis and GERD . Her previous allergy office visit was on 12/22/21 with Dr. Maudie Mercury. Today is a regular follow up visit.  History obtained from patient, chart review.  I saw patient after test prior injection on 03/08/2022.  She had are reporting increased coughing, wheezing she has been be slightly tachycardic and wheezing on exam.  She did report she is having increasing symptoms prior to left albuterol, Depo-Medrol, Zyrtec.  Prednisone taper.  She is following up with me today.   Asthma/ copd overlap syndrome Past history - Patient used to follow with pulmonology as well. Still smoking 1.5 pack per day which is less than her previous of 3 packs per day. Normal alpha-1 level. Eosinophils 100, IgE 458.   -Started on vest therapy at last visit.  Manual CPT was considered however due to advanced disease she would not tolerate positioning.  Reports increased coughing with vest therapy -Chest CT 2023: IMPRESSION: 1. Mild central bronchiectasis and bronchial wall thickening in keeping with airway inflammation. Mucoid impaction within several segmental bronchi of the left upper lobe. 2. Mild multi-vessel coronary artery calcification. 3. Sparse ground-glass opacity within the right lower lobe may be infectious or inflammatory in nature. 4.  Aortic Atherosclerosis  -Reports about 4 weeks of increasing URI symptoms prior to possible reaction to Tezspire.  She did not receive antibiotics.  She has not seen pulmonary in many  years.  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 She mentions skipping few doses at times due to inability to self-administer due to her coughing fits.  Interim History:  -Last antibiotics in June -Prefers IVIG over SCIG -Due for blood work  Seasonal and perennial allergic rhinitis Past history - 2021 bloodwork positive to cat, dog, grass, trees, ragweed.  Interim history - controlled.   GERD  - on omeprazole 23m twice daily  -  well controllled   Assessment and Plan: TRachealis a 66y.o. female with: Asthma-COPD overlap syndrome - Plan: Spirometry with Graph  CVID (common variable immunodeficiency) ([place] - Plan: Immunoglobulins, QN, A/E/G/M, CMP14+EGFR, CBC With Diff/Platelet, Spirometry with Graph  Seasonal and perennial allergic rhinitis  Gastroesophageal reflux disease without esophagitis  Bronchiectasis with acute lower respiratory infection ([place] Plan: Patient Instructions  CVID (common variable immunodeficiency) with bronchiectasis Continue at home infusions once a month with Privigen. Keep track of infections. Updated blood work Start Augmentin 875/1272mtwice a day for 14 days Diflucan 15069mO given for possible yeast infection    Asthma-COPD overlap syndrome  Continue Vest therapy  Continue xopenox (levalbuterol) every 4-6 hours as needed  DECREASE smoking. START budesonide (Pulmicort) 0.5mg25mbulizer for 1-2 weeks until your breathing symptoms return to baseline.  Daily controller medication(s): continue Symbicort 160mc62mpuffs twice a day with spacer and rinse mouth afterwards. Continue Spiriva 1.25mcg28muffs once a day. Continue Tezspire injections every 3 weeks.  During upper respiratory infections/flares:  Start budesonide (Pulmicort) 0.52m nebulizer twice a day for 1-2 weeks until your breathing symptoms  return to baseline.  [place]  Pretreat with albuterol 2 puffs or albuterol nebulizer.  If you need to use your albuterol nebulizer machine back to back within 15-30 minutes with no relief then please go to the ER/urgent care for further evaluation.  May use levoalbuterol rescue inhaler 2 puffs or nebulizer every 4 to 6 hours as needed for shortness of breath, chest tightness, coughing, and wheezing. May use levoalbuterol rescue inhaler 2 puffs 5 to 15 minutes prior to strenuous physical activities. Monitor frequency of use.  Asthma control goals:  Full participation in all desired activities (may need albuterol before activity) Albuterol use two times or less a week on average (not counting use with activity) Cough interfering with sleep two times or less a month Oral steroids no more than once a year No hospitalizations    Seasonal and perennial allergic rhinitis 2021 bloodwork positive to cat, dog, grass, trees, ragweed.  Take loratadine 128mdaily in the morning.  Continue with Singulair (montelukast) 107maily at night. Use Flonase (fluticasone) nasal spray 1 spray per nostril twice a day as needed for nasal congestion.  Use Atrovent (ipratropium) 0.03% 1-2 sprays per nostril twice a day as needed for runny nose/drainage. Nasal saline spray (i.e., Simply Saline) or nasal saline lavage (i.e., NeilMed) is recommended as needed and prior to medicated nasal sprays. Continue environmental control measures.   Gastroesophageal reflux disease Continue lifestyle and dietary modifications. Continue omeprazole 4m24mice a day.   Follow up: 4 weeks   Thank you so much for letting me partake in your care today.  Don't hesitate to reach out if you have any additional concerns!  EvelRoney Marion  [facility] [place] [place]  No follow-ups on file.  Meds ordered this encounter  Medications   budesonide (PULMICORT) 0.5 MG/2ML nebulizer solution    Sig: USE  ONE VIAL IN NEBULIZER IN THE MORNING AND AT BEDTIME ONLY AS NEEDED    Dispense:  120 mL    Refill:  5    File on part B   amoxicillin-clavulanate (AUGMENTIN) 875-125 MG tablet    Sig: Take 1 tablet by mouth 2 (two) times daily for 14 days.    Dispense:  28 tablet    Refill:  0   fluconazole (DIFLUCAN) 150 MG tablet    Sig: Take 1 tablet (150 mg total) by mouth once for 1 dose.    Dispense:  1 tablet    Refill:  0   Tiotropium Bromide Monohydrate (SPIRIVA RESPIMAT) 1.25 MCG/ACT AERS    Sig: INHALE TWO PUFFS INTO THE LUNGS DAILY    Dispense:  4 g    Refill:  5   budesonide-formoterol (SYMBICORT) 160-4.5 MCG/ACT inhaler    Sig: INHALE TWO PUFFS BY MOUTH EVERY MORNING AND AT BEDTIME. USE WITH SPACER AND RINSE MOUTH AFTERWARDS    Dispense:  10.2 g    Refill:  5    Lab Orders         Immunoglobulins, QN, A/E/G/M         CMP14+EGFR         CBC With Diff/Platelet     Diagnostics: Spirometry:  Tracings reviewed. Her effort: Good reproducible efforts. FVC: 1.87 L FEV1: 1.56 L, 71% predicted FEV1/FVC ratio: 83% Interpretation: Spirometry consistent with possible restrictive disease.  Please see scanned spirometry results for details.   Results interpreted by  myself during this encounter and discussed with patient/family.   Medication List:  Current Outpatient Medications  Medication Sig Dispense Refill   albuterol (PROVENTIL) (2.5 MG/3ML) 0.083% nebulizer solution Take 3 mLs (2.5 mg total) by nebulization every 6 (six) hours as needed for wheezing or shortness of breath. 75 mL 1   albuterol (VENTOLIN HFA) 108 (90 Base) MCG/ACT inhaler INHALE 2 PUFFS INTO THE LUNGS EVERY 4 HOURS AS NEEDED FOR WHEEZING OR SHORTNESS OF BREATH 8.5 g 1   amLODipine (NORVASC) 10 MG tablet Take 10 mg by mouth daily.     amoxicillin-clavulanate (AUGMENTIN) 875-125 MG tablet Take 1 tablet by mouth 2 (two) times daily for 14 days. 28 tablet 0   atorvastatin (LIPITOR) 10 MG tablet Take 10 mg by mouth daily.      buPROPion (WELLBUTRIN XL) 150 MG 24 hr tablet Take 1 tablet by mouth daily.     busPIRone (BUSPAR) 10 MG tablet Take by mouth.     Calcium Carb-Cholecalciferol (CALCIUM CARBONATE-VITAMIN D3) 600-400 MG-UNIT TABS TAKE ONE TABLET BY MOUTH 2 TIMES A DAY     cetirizine (ZYRTEC) 10 MG tablet Take one tablet once daily for runny nose or itchy eyes 30 tablet 5   Cholecalciferol (VITAMIN D3) 50 MCG (2000 UT) TABS Take by mouth.     EPINEPHrine (EPIPEN 2-PAK) 0.3 mg/0.3 mL IJ SOAJ injection Use as needed for severe allergic reactions. 2 each 3   fluticasone (FLONASE) 50 MCG/ACT nasal spray Place 1 spray into both nostrils in the morning and at bedtime. For drainage 16 g 5   hydrochlorothiazide (HYDRODIURIL) 25 MG tablet Take 25 mg by mouth daily.     Immune Globulin, Human, (PRIVIGEN IV) Inject 30 mg into the vein every 30 (thirty) days.     ipratropium (ATROVENT) 0.03 % nasal spray PLACE 1 TO 2 SPRAYS INTO EACH NOSTRIL TWICE DAILY AS NEEDED FOR NASAL DRAINAGE. 30 mL 5   ipratropium-albuterol (DUONEB) 0.5-2.5 (3) MG/3ML SOLN Take 3 mLs by nebulization every 6 (six) hours as needed. 360 mL 5   levalbuterol (XOPENEX HFA) 45 MCG/ACT inhaler Inhale 2 puffs into the lungs every 4 (four) hours as needed for wheezing or shortness of breath (coughing fits). 1 each 2   levocetirizine (XYZAL) 5 MG tablet TAKE ONE TABLET BY MOUTH DAILY AS NEEDED FOR ALLERGIES 30 tablet 5   loratadine (ALLERGY RELIEF) 10 MG tablet Take 1 tablet (10 mg total) by mouth daily. 30 tablet 5   losartan (COZAAR) 50 MG tablet Take 1 tablet by mouth daily.     montelukast (SINGULAIR) 10 MG tablet TAKE ONE TABLET BY MOUTH AT BEDTIME 30 tablet 11   Olopatadine HCl 0.2 % SOLN Place 1 drop into both eyes daily as needed. 2.5 mL 5   omeprazole (PRILOSEC) 20 MG capsule Take 20 mg by mouth 2 (two) times daily.     traZODone (DESYREL) 50 MG tablet Take 50 mg by mouth at bedtime.     vitamin C (ASCORBIC ACID) 500 MG tablet Take 500 mg by mouth  daily.     Zinc 50 MG CAPS Take by mouth.     budesonide (PULMICORT) 0.5 MG/2ML nebulizer solution USE ONE VIAL IN NEBULIZER IN THE MORNING AND AT BEDTIME ONLY AS NEEDED 120 mL 5   budesonide-formoterol (SYMBICORT) 160-4.5 MCG/ACT inhaler INHALE TWO PUFFS BY MOUTH EVERY MORNING AND AT BEDTIME. USE WITH SPACER AND RINSE MOUTH AFTERWARDS 10.2 g 5   Tiotropium Bromide Monohydrate (SPIRIVA RESPIMAT) 1.25 MCG/ACT AERS INHALE TWO  PUFFS INTO THE LUNGS DAILY 4 g 5   Current Facility-Administered Medications  Medication Dose Route Frequency Provider Last Rate Last Admin   tezepelumab-ekko (TEZSPIRE) 210 MG/1.91ML syringe 210 mg  210 mg Subcutaneous Q28 days Garnet Sierras, DO   210 mg at 03/08/22 1407   Allergies: Allergies  Allergen Reactions   Doxycycline Rash   Cyclobenzaprine    Hydrocortisone     Breaks out in hives with topical cream.    Sulfamethoxazole-Trimethoprim Swelling   Amoxicillin-Pot Clavulanate Nausea Only   Codeine Rash    Other reaction(s): RASH   I reviewed her past medical history, social history, family history, and environmental history and no significant changes have been reported from her previous visit.  ROS: All others negative except as noted per HPI.   Objective: BP 136/66 (BP Location: Left Arm, Patient Position: Sitting, Cuff Size: Normal)   Pulse 70   Temp 98 F (36.7 C) (Temporal)   Resp 18   SpO2 96%  There is no height or weight on file to calculate BMI. General Appearance:  Alert, cooperative, no distress, appears stated age  Head:  Normocephalic, without obvious abnormality, atraumatic  Eyes:  Conjunctiva clear, EOM's intact  Nose: Nares normal,   Throat: Lips, tongue normal; teeth and gums normal,   Neck: Supple, symmetrical  Lungs:   end-expiratory wheezing, Respirations unlabored, intermittent dry coughing  Heart:  regular rate and rhythm and no murmur, Appears well perfused  Extremities: No edema  Skin: Skin color, texture, turgor normal, no  rashes or lesions on visualized portions of skin   Neurologic: No gross deficits   Previous notes and tests were reviewed. The plan was reviewed with the patient/family, and all questions/concerned were addressed.  It was my pleasure to see Dale today and participate in her care. Please feel free to contact me with any questions or concerns.  Sincerely,  Roney Marion, MD  Allergy & Immunology  [facility] [place] [facility] Office: [phone]

## 2022-03-14 NOTE — Patient Instructions (Addendum)
CVID (common variable immunodeficiency) with bronchiectasis Continue at home infusions once a month with Privigen. Keep track of infections. Updated blood work Start Augmentin 875/125mg  twice a day for 14 days Diflucan 150mg  PO given for possible yeast infection    Asthma-COPD overlap syndrome  Continue Vest therapy  Continue xopenox (levalbuterol) every 4-6 hours as needed  DECREASE smoking. START budesonide (Pulmicort) 0.5mg  nebulizer for 1-2 weeks until your breathing symptoms return to baseline.  Daily controller medication(s): continue Symbicort 160mcg 2 puffs twice a day with spacer and rinse mouth afterwards. Continue Spiriva 1.20mcg 2 puffs once a day. Continue Tezspire injections every 3 weeks.  During upper respiratory infections/flares:  Start budesonide (Pulmicort) 0.5mg  nebulizer twice a day for 1-2 weeks until your breathing symptoms return to baseline.  Call us for antibiotics  Pretreat with albuterol 2 puffs or albuterol nebulizer.  If you need to use your albuterol nebulizer machine back to back within 15-30 minutes with no relief then please go to the ER/urgent care for further evaluation.  May use levoalbuterol rescue inhaler 2 puffs or nebulizer every 4 to 6 hours as needed for shortness of breath, chest tightness, coughing, and wheezing. May use levoalbuterol rescue inhaler 2 puffs 5 to 15 minutes prior to strenuous physical activities. Monitor frequency of use.  Asthma control goals:  Full participation in all desired activities (may need albuterol before activity) Albuterol use two times or less a week on average (not counting use with activity) Cough interfering with sleep two times or less a month Oral steroids no more than once a year No hospitalizations    Seasonal and perennial allergic rhinitis 2021 bloodwork positive to cat, dog, grass, trees, ragweed.  Take loratadine 10mg  daily in the morning.  Continue with Singulair (montelukast) 10mg  daily at  night. Use Flonase (fluticasone) nasal spray 1 spray per nostril twice a day as needed for nasal congestion.  Use Atrovent (ipratropium) 0.03% 1-2 sprays per nostril twice a day as needed for runny nose/drainage. Nasal saline spray (i.e., Simply Saline) or nasal saline lavage (i.e., NeilMed) is recommended as needed and prior to medicated nasal sprays. Continue environmental control measures.   Gastroesophageal reflux disease Continue lifestyle and dietary modifications. Continue omeprazole 20mg  twice a day.   Follow up: 4 weeks   Thank you so much for letting me partake in your care today.  Don't hesitate to reach out if you have any additional concerns!  Roney Marion, MD  [facility] [place], [place]

## 2022-03-18 LAB — CBC WITH DIFF/PLATELET
Basophils Absolute: 0.1 10*3/uL (ref 0.0–0.2)
Basos: 1 %
EOS (ABSOLUTE): 0.1 10*3/uL (ref 0.0–0.4)
Eos: 1 %
Hematocrit: 44.2 % (ref 34.0–46.6)
Hemoglobin: 14.8 g/dL (ref 11.1–15.9)
Immature Grans (Abs): 0.1 10*3/uL (ref 0.0–0.1)
Immature Granulocytes: 1 %
Lymphocytes Absolute: 3.6 10*3/uL — ABNORMAL HIGH (ref 0.7–3.1)
Lymphs: 34 %
MCH: 30.2 pg (ref 26.6–33.0)
MCHC: 33.5 g/dL (ref 31.5–35.7)
MCV: 90 fL (ref 79–97)
Monocytes Absolute: 0.8 10*3/uL (ref 0.1–0.9)
Monocytes: 7 %
Neutrophils Absolute: 6 10*3/uL (ref 1.4–7.0)
Neutrophils: 56 %
Platelets: 317 10*3/uL (ref 150–450)
RBC: 4.9 x10E6/uL (ref 3.77–5.28)
RDW: 13.1 % (ref 11.7–15.4)
WBC: 10.5 10*3/uL (ref 3.4–10.8)

## 2022-03-18 LAB — CMP14+EGFR
ALT: 19 IU/L (ref 0–32)
AST: 24 IU/L (ref 0–40)
Albumin/Globulin Ratio: 1.7 (ref 1.2–2.2)
Albumin: 4.5 g/dL (ref 3.9–4.9)
Alkaline Phosphatase: 121 IU/L (ref 44–121)
BUN/Creatinine Ratio: 16 (ref 12–28)
BUN: 14 mg/dL (ref 8–27)
Bilirubin Total: 0.3 mg/dL (ref 0.0–1.2)
CO2: 23 mmol/L (ref 20–29)
Calcium: 9.8 mg/dL (ref 8.7–10.3)
Chloride: 100 mmol/L (ref 96–106)
Creatinine, Ser: 0.87 mg/dL (ref 0.57–1.00)
Globulin, Total: 2.7 g/dL (ref 1.5–4.5)
Glucose: 101 mg/dL — ABNORMAL HIGH (ref 70–99)
Potassium: 4.3 mmol/L (ref 3.5–5.2)
Sodium: 140 mmol/L (ref 134–144)
Total Protein: 7.2 g/dL (ref 6.0–8.5)
eGFR: 74 mL/min/{1.73_m2} (ref >59)

## 2022-03-18 LAB — IMMUNOGLOBULINS A/E/G/M, SERUM
IgA/Immunoglobulin A, Serum: 150 mg/dL (ref 87–352)
IgE (Immunoglobulin E), Serum: 329 IU/mL (ref 6–495)
IgG (Immunoglobin G), Serum: 970 mg/dL (ref 586–1602)
IgM (Immunoglobulin M), Srm: 79 mg/dL (ref 26–217)

## 2022-03-20 ENCOUNTER — Other Ambulatory Visit: Payer: Self-pay | Admitting: Allergy

## 2022-03-20 ENCOUNTER — Telehealth: Payer: Self-pay | Admitting: Internal Medicine

## 2022-03-20 NOTE — Progress Notes (Signed)
Called patient to inform her of lab results she stated she did not know who Dr Marlynn Perking was I tried to remind her we administer tespire but she hung up  on me. I will call back later.

## 2022-03-20 NOTE — Telephone Encounter (Signed)
Patient informed of labs

## 2022-03-20 NOTE — Progress Notes (Signed)
Patients metabolic panel, blood counts and Immunoglobulin levels were all normal.  We will continue current plan.  Can someone let Nana know?  Thanks!

## 2022-03-20 NOTE — Telephone Encounter (Signed)
Pt states she got a call from [facility] health and would like a call back

## 2022-03-20 NOTE — Telephone Encounter (Signed)
R/c spoke to patient informed of lab results and continue with current plan

## 2022-03-21 ENCOUNTER — Other Ambulatory Visit: Payer: Self-pay | Admitting: *Deleted

## 2022-03-21 MED ORDER — TEZSPIRE 210 MG/1.91ML ~~LOC~~ SOSY: 210.0000 mg | PREFILLED_SYRINGE | SUBCUTANEOUS | 11 refills | Status: AC

## 2022-03-29 ENCOUNTER — Ambulatory Visit: Payer: 59

## 2022-04-04 ENCOUNTER — Ambulatory Visit (INDEPENDENT_AMBULATORY_CARE_PROVIDER_SITE_OTHER): Payer: 59

## 2022-04-04 DIAGNOSIS — J4550 Severe persistent asthma, uncomplicated: Secondary | ICD-10-CM

## 2022-04-11 ENCOUNTER — Encounter: Payer: Self-pay | Admitting: Internal Medicine

## 2022-04-11 ENCOUNTER — Ambulatory Visit (INDEPENDENT_AMBULATORY_CARE_PROVIDER_SITE_OTHER): Payer: 59 | Admitting: Internal Medicine

## 2022-04-11 ENCOUNTER — Other Ambulatory Visit: Payer: Self-pay

## 2022-04-11 VITALS — BP 134/82 | HR 72 | Temp 97.9°F | Resp 18 | Wt 142.4 lb

## 2022-04-11 DIAGNOSIS — K219 Gastro-esophageal reflux disease without esophagitis: Secondary | ICD-10-CM | POA: Diagnosis not present

## 2022-04-11 DIAGNOSIS — J3089 Other allergic rhinitis: Secondary | ICD-10-CM

## 2022-04-11 DIAGNOSIS — D839 Common variable immunodeficiency, unspecified: Secondary | ICD-10-CM

## 2022-04-11 DIAGNOSIS — J4489 Other specified chronic obstructive pulmonary disease: Secondary | ICD-10-CM

## 2022-04-11 DIAGNOSIS — Z720 Tobacco use: Secondary | ICD-10-CM

## 2022-04-11 DIAGNOSIS — J302 Other seasonal allergic rhinitis: Secondary | ICD-10-CM

## 2022-04-11 MED ORDER — AMOXICILLIN-POT CLAVULANATE 875-125 MG PO TABS: 1.0000 | ORAL_TABLET | Freq: Two times a day (BID) | ORAL | 0 refills | Status: DC

## 2022-04-11 MED ORDER — CARBINOXAMINE MALEATE 4 MG PO TABS: 4.0000 mg | ORAL_TABLET | Freq: Two times a day (BID) | ORAL | 3 refills | Status: AC

## 2022-04-11 MED ORDER — MONTELUKAST SODIUM 10 MG PO TABS: 10.0000 mg | ORAL_TABLET | Freq: Every day | ORAL | 11 refills | Status: DC

## 2022-04-11 MED ORDER — EPINEPHRINE 0.3 MG/0.3ML IJ SOAJ: INTRAMUSCULAR | 3 refills | Status: DC

## 2022-04-11 MED ORDER — METHYLPREDNISOLONE ACETATE 40 MG/ML IJ SUSP
40.0000 mg | Freq: Once | INTRAMUSCULAR | Status: AC
  Administered 2022-04-11: 40 mg via INTRAMUSCULAR

## 2022-04-11 MED ORDER — LEVOCETIRIZINE DIHYDROCHLORIDE 5 MG PO TABS: ORAL_TABLET | ORAL | 5 refills | Status: AC

## 2022-04-11 MED ORDER — CETIRIZINE HCL 10 MG PO TABS: ORAL_TABLET | ORAL | 5 refills | Status: AC

## 2022-04-11 MED ORDER — RYALTRIS 665-25 MCG/ACT NA SUSP: 2.0000 | Freq: Two times a day (BID) | NASAL | 5 refills | Status: DC

## 2022-04-11 MED ORDER — SPIRIVA RESPIMAT 1.25 MCG/ACT IN AERS: INHALATION_SPRAY | RESPIRATORY_TRACT | 5 refills | Status: DC

## 2022-04-11 MED ORDER — FLUTICASONE PROPIONATE 50 MCG/ACT NA SUSP: 1.0000 | Freq: Two times a day (BID) | NASAL | 5 refills | Status: DC

## 2022-04-11 MED ORDER — ALBUTEROL SULFATE (2.5 MG/3ML) 0.083% IN NEBU: 2.5000 mg | INHALATION_SOLUTION | Freq: Four times a day (QID) | RESPIRATORY_TRACT | 1 refills | Status: DC | PRN

## 2022-04-11 MED ORDER — BUDESONIDE-FORMOTEROL FUMARATE 160-4.5 MCG/ACT IN AERO: INHALATION_SPRAY | RESPIRATORY_TRACT | 5 refills | Status: DC

## 2022-04-11 MED ORDER — ALBUTEROL SULFATE HFA 108 (90 BASE) MCG/ACT IN AERS: INHALATION_SPRAY | RESPIRATORY_TRACT | 1 refills | Status: DC

## 2022-04-11 MED ORDER — BUDESONIDE 0.5 MG/2ML IN SUSP: RESPIRATORY_TRACT | 5 refills | Status: DC

## 2022-04-11 MED ORDER — LEVALBUTEROL TARTRATE 45 MCG/ACT IN AERO: 2.0000 | INHALATION_SPRAY | RESPIRATORY_TRACT | 2 refills | Status: DC | PRN

## 2022-04-11 NOTE — Progress Notes (Signed)
Follow Up Note  RE: Kristi Allen MRN: [id number] DOB: 09-Dec-1955 Date of Office Visit: 04/11/2022  Referring provider: List, Nash Shearer, FNP Primary care provider: List, Nash Shearer, FNP  Chief Complaint: Cough and Follow-up  History of Present Illness: I had the pleasure of seeing Kristi Allen for a follow up visit at the [facility] on 04/11/2022. She is a 66 y.o. female, who is being followed for C VID, persistent asthma/COPD overlap, GERD, allergic rhinitis on test prior,. Her previous allergy office visit was on 03/14/2022 with Dr. Marlynn Perking. Today is a regular follow up visit.  History obtained from patient, chart review. At last visit she was treated with Augmentin for an infection and started on budesonide 0.5mg  nebulized for 2 weeks for worsening symptoms.    She is on tezpire, symbicort and spirivia.  She is on privigen for IVIG.      Asthma/ copd overlap syndrome Past history - Patient used to follow with pulmonology as well.  Normal alpha-1 level. Eosinophils 100, IgE 458.   -On vest therapy at last visit.  Manual CPT was considered however due to advanced disease she would not tolerate positioning.   -Chest CT 2023: IMPRESSION: 1. Mild central bronchiectasis and bronchial wall thickening in keeping with airway inflammation. Mucoid impaction within several segmental bronchi of the left upper lobe. 2. Mild multi-vessel coronary artery calcification. 3. Sparse ground-glass opacity within the right lower lobe may be infectious or inflammatory in nature. 4.  Aortic Atherosclerosis  Interim Hx: Since last visit wheezing has improved however she still had persistent coughing which she feels triggered by postnasal drip.  She is having stomach cramping from coughing.  She is using her vest therapy but she feels like it increases her coughing.   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 She mentions skipping few doses at times due to inability to self-administer due to her coughing fits.  Interim History:  -Last antibiotics in November 2023  -Prefers IVIG over SCIG -CBC//CMP/IgG: normal at goal 03/14/22    Seasonal and perennial allergic rhinitis Past history - 2021 bloodwork positive to cat, dog, grass, trees, ragweed.  Interim history - increased post nasal drip triggering cough Currently on Singulair 10 mg daily, Flonase 1 spray per nostril twice a day, Atrovent 1 to 2 sprays per nostril daily. Relief with nasal sprays but is still having significant drainage    GERD  - on omeprazole 20mg  twice daily  -  well controllled, no breakthrough symptoms  Assessment and Plan: Aylyn is a 66 y.o. female with: CVID (common variable immunodeficiency) (HCC)  Asthma-COPD overlap syndrome  Seasonal and perennial allergic rhinitis  Gastroesophageal reflux disease without esophagitis  Tobacco use Plan: Patient Instructions  CVID (common variable immunodeficiency) with bronchiectasis Continue at home infusions once a month with Privigen. Keep track of infections. Last ABX in November  Start Prednisone 10mg  tablet pack: 2 tablets given in office today. Take 2 more tablets before bed today.  Then take 2 tablets twice a day for 2 more days. Then take 2 tablets once a day for 1 day. Then take 1 tablet once a day for 1 day.     Asthma-COPD overlap syndrome  Continue Vest therapy  Continue xopenox (levalbuterol) every 4-6 hours as needed  DECREASE smoking. Continue  budesonide (Pulmicort) 0.5mg  nebulizer for 1-2 weeks until your breathing  symptoms return to baseline.  Daily controller medication(s): continue Symbicort 2 puffs twice a day with spacer and rinse mouth afterwards. Continue Spiriva 1.24mcg 2 puffs once a day. Continue Tezspire injections every 3 weeks.  During upper respiratory  infections/flares:  Start budesonide (Pulmicort) 0.5mg  nebulizer twice a day for 1-2 weeks until your breathing symptoms return to baseline.  Call us for antibiotics  Pretreat with albuterol 2 puffs or albuterol nebulizer.  If you need to use your albuterol nebulizer machine back to back within 15-30 minutes with no relief then please go to the ER/urgent care for further evaluation.  May use levoalbuterol rescue inhaler 2 puffs or nebulizer every 4 to 6 hours as needed for shortness of breath, chest tightness, coughing, and wheezing. May use levoalbuterol rescue inhaler 2 puffs 5 to 15 minutes prior to strenuous physical activities. Monitor frequency of use.  Asthma control goals:  Full participation in all desired activities (may need albuterol before activity) Albuterol use two times or less a week on average (not counting use with activity) Cough interfering with sleep two times or less a month Oral steroids no more than once a year No hospitalizations    Seasonal and perennial allergic rhinitis 2021 bloodwork positive to cat, dog, grass, trees, ragweed.  STOP loratadine and nasal atrovent  Start carbinoxamine  twice a day  Continue with Singulair (montelukast)  daily at night. Start Rylatris 2 sprays per nostril daily  This replaces flonase and atrovent  Nasal saline spray (i.e., Simply Saline) or nasal saline lavage (i.e., NeilMed) is recommended as needed and prior to medicated nasal sprays. Continue environmental control measures.   Gastroesophageal reflux disease Continue lifestyle and dietary modifications. Continue omeprazole  twice a day.   Follow up: 2 months  Thank you so much for letting me partake in your care today.  Don't hesitate to reach out if you have any additional concerns!  Ferol Luz, MD  [facility] [place] [place] No follow-ups on file.  Meds ordered this encounter  Medications   budesonide (PULMICORT) 0.5 MG/2ML nebulizer  solution    Sig: USE ONE VIAL IN NEBULIZER IN THE MORNING AND AT BEDTIME ONLY AS NEEDED    Dispense:  120 mL    Refill:  5    File on part B   montelukast (SINGULAIR) 10 MG tablet    Sig: Take 1 tablet (10 mg total) by mouth at bedtime.    Dispense:  30 tablet    Refill:  11   Tiotropium Bromide Monohydrate (SPIRIVA RESPIMAT) 1.25 MCG/ACT AERS    Sig: INHALE TWO PUFFS INTO THE LUNGS DAILY    Dispense:  4 g    Refill:  5   budesonide-formoterol (SYMBICORT) 160-4.5 MCG/ACT inhaler    Sig: INHALE TWO PUFFS BY MOUTH EVERY MORNING AND AT BEDTIME. USE WITH SPACER AND RINSE MOUTH AFTERWARDS    Dispense:  10.2 g    Refill:  5   EPINEPHrine (EPIPEN 2-PAK) 0.3 mg/0.3 mL IJ SOAJ injection    Sig: Use as needed for severe allergic reactions.    Dispense:  2 each    Refill:  3    Dispense Teva or Mylan brand only   albuterol (PROVENTIL) (2.5 MG/3ML) 0.083% nebulizer solution    Sig: Take 3 mLs (2.5 mg total) by nebulization every 6 (six) hours as needed for wheezing or shortness of breath.    Dispense:  75 mL    Refill:  1   albuterol (VENTOLIN  HFA) 108 (90 Base) MCG/ACT inhaler    Sig: INHALE 2 PUFFS INTO THE LUNGS EVERY 4 HOURS AS NEEDED FOR WHEEZING OR SHORTNESS OF BREATH    Dispense:  8.5 g    Refill:  1   cetirizine (ZYRTEC) 10 MG tablet    Sig: Take one tablet once daily for runny nose or itchy eyes    Dispense:  30 tablet    Refill:  5   fluticasone (FLONASE) 50 MCG/ACT nasal spray    Sig: Place 1 spray into both nostrils in the morning and at bedtime. For drainage    Dispense:  16 g    Refill:  5   levalbuterol (XOPENEX HFA) 45 MCG/ACT inhaler    Sig: Inhale 2 puffs into the lungs every 4 (four) hours as needed for wheezing or shortness of breath (coughing fits).    Dispense:  1 each    Refill:  2   levocetirizine (XYZAL) 5 MG tablet    Sig: TAKE ONE TABLET BY MOUTH DAILY AS NEEDED FOR ALLERGIES    Dispense:  30 tablet    Refill:  5   Carbinoxamine Maleate 4 MG TABS    Sig:  Take 1 tablet (4 mg total) by mouth in the morning and at bedtime.    Dispense:  60 tablet    Refill:  3   DISCONTD: amoxicillin-clavulanate (AUGMENTIN) 875-125 MG tablet    Sig: Take 1 tablet by mouth 2 (two) times daily for 14 days.    Dispense:  28 tablet    Refill:  0   Olopatadine-Mometasone (RYALTRIS) 665-25 MCG/ACT SUSP    Sig: Place 2 sprays into the nose in the morning and at bedtime.    Dispense:  29 g    Refill:  5   methylPREDNISolone acetate (DEPO-MEDROL) injection 40 mg    Lab Orders  No laboratory test(s) ordered today   Diagnostics: None done    Medication List:  Current Outpatient Medications  Medication Sig Dispense Refill   amLODipine (NORVASC) 10 MG tablet Take 10 mg by mouth daily.     atorvastatin (LIPITOR) 10 MG tablet Take 10 mg by mouth daily.     buPROPion (WELLBUTRIN XL) 150 MG 24 hr tablet Take 1 tablet by mouth daily.     busPIRone (BUSPAR) 10 MG tablet Take by mouth.     Calcium Carb-Cholecalciferol (CALCIUM CARBONATE-VITAMIN D3) 600-400 MG-UNIT TABS TAKE ONE TABLET BY MOUTH 2 TIMES A DAY     Carbinoxamine Maleate 4 MG TABS Take 1 tablet (4 mg total) by mouth in the morning and at bedtime. 60 tablet 3   Cholecalciferol (VITAMIN D3) 50 MCG (2000 UT) TABS Take by mouth.     hydrochlorothiazide (HYDRODIURIL) 25 MG tablet Take 25 mg by mouth daily.     Immune Globulin, Human, (PRIVIGEN IV) Inject 30 mg into the vein every 30 (thirty) days.     ipratropium (ATROVENT) 0.03 % nasal spray PLACE 1 TO 2 SPRAYS INTO EACH NOSTRIL TWICE DAILY AS NEEDED FOR NASAL DRAINAGE. 30 mL 5   ipratropium-albuterol (DUONEB) 0.5-2.5 (3) MG/3ML SOLN Take 3 mLs by nebulization every 6 (six) hours as needed. 360 mL 5   loratadine (ALLERGY RELIEF) 10 MG tablet Take 1 tablet (10 mg total) by mouth daily. 30 tablet 5   losartan (COZAAR) 50 MG tablet Take 1 tablet by mouth daily.     Olopatadine HCl 0.2 % SOLN Place 1 drop into both eyes daily as needed. 2.5 mL 5  Olopatadine-Mometasone (RYALTRIS) [id number] MCG/ACT SUSP Place 2 sprays into the nose in the morning and at bedtime. 29 g 5   omeprazole (PRILOSEC) 20 MG capsule Take 20 mg by mouth 2 (two) times daily.     tezepelumab-ekko (TEZSPIRE) 210 MG/1. syringe INJECT 210MG  SUBCUTANEOUSLY  EVERY 4 WEEKS TO BE ADMINISTERED BY A HEALTHCARE PROVIDER IN A  HEALTHCARE SETTING 1.91 mL 11   tezepelumab-ekko (TEZSPIRE) 210 MG/1. syringe Inject 1.91 mLs (210 mg total) into the skin every 28 (twenty-eight) days. 1.91 mL 11   traZODone (DESYREL) 50 MG tablet Take 50 mg by mouth at bedtime.     vitamin C (ASCORBIC ACID) 500 MG tablet Take 500 mg by mouth daily.     Zinc 50 MG CAPS Take by mouth.     albuterol (PROVENTIL) (2.5 MG/3ML) 0.083% nebulizer solution Take 3 mLs (2.5 mg total) by nebulization every 6 (six) hours as needed for wheezing or shortness of breath. 75 mL 1   albuterol (VENTOLIN HFA) 108 (90 Base) MCG/ACT inhaler INHALE 2 PUFFS INTO THE LUNGS EVERY 4 HOURS AS NEEDED FOR WHEEZING OR SHORTNESS OF BREATH 8.5 g 1   budesonide (PULMICORT) 0.5 MG/2ML nebulizer solution USE ONE VIAL IN NEBULIZER IN THE MORNING AND AT BEDTIME ONLY AS NEEDED 120 mL 5   budesonide-formoterol (SYMBICORT) 160-4.5 MCG/ACT inhaler INHALE TWO PUFFS BY MOUTH EVERY MORNING AND AT BEDTIME. USE WITH SPACER AND RINSE MOUTH AFTERWARDS 10.2 g 5   cetirizine (ZYRTEC) 10 MG tablet Take one tablet once daily for runny nose or itchy eyes 30 tablet 5   EPINEPHrine (EPIPEN 2-PAK) 0.3 mg/0.3 mL IJ SOAJ injection Use as needed for severe allergic reactions. 2 each 3   fluticasone (FLONASE) 50 MCG/ACT nasal spray Place 1 spray into both nostrils in the morning and at bedtime. For drainage 16 g 5   levalbuterol (XOPENEX HFA) 45 MCG/ACT inhaler Inhale 2 puffs into the lungs every 4 (four) hours as needed for wheezing or shortness of breath (coughing fits). 1 each 2   levocetirizine (XYZAL) 5 MG tablet TAKE ONE TABLET BY MOUTH DAILY AS NEEDED FOR  ALLERGIES 30 tablet 5   montelukast (SINGULAIR) 10 MG tablet Take 1 tablet (10 mg total) by mouth at bedtime. 30 tablet 11   Tiotropium Bromide Monohydrate (SPIRIVA RESPIMAT) 1.25 MCG/ACT AERS INHALE TWO PUFFS INTO THE LUNGS DAILY 4 g 5   Current Facility-Administered Medications  Medication Dose Route Frequency Provider Last Rate Last Admin   tezepelumab-ekko (TEZSPIRE) 210 MG/1. syringe 210 mg  210 mg Subcutaneous Q28 days , DO   210 mg at 04/04/22 1532   Allergies: Allergies  Allergen Reactions   Doxycycline Rash   Cyclobenzaprine    Hydrocortisone     Breaks out in hives with topical cream.    Sulfamethoxazole-Trimethoprim Swelling   Amoxicillin-Pot Clavulanate Nausea Only   Codeine Rash    Other reaction(s): RASH   I reviewed her past medical history, social history, family history, and environmental history and no significant changes have been reported from her previous visit.  ROS: All others negative except as noted per HPI.   Objective: BP 134/82 (BP Location: Right Arm, Patient Position: Sitting, Cuff Size: Normal)   Pulse 72   Temp 97.9 F (36.6 C) (Temporal)   Resp 18   Wt 142 lb 6.4 oz (64.6 kg)   SpO2 98%   BMI 26.91 kg/m  Body mass index is 26.91 kg/m. General Appearance:  Alert, cooperative, tearful , appears stated age  Head:  Normocephalic, without obvious abnormality, atraumatic  Eyes:  Conjunctiva clear, EOM's intact  Nose: Nares normal,   Throat: Lips, tongue normal; teeth and gums normal,   Neck: Supple, symmetrical  Lungs:   end-expiratory wheezing, Respirations unlabored, intermittent dry coughing  Heart:  regular rate and rhythm and no murmur, Appears well perfused  Extremities: No edema  Skin: Skin color, texture, turgor normal, no rashes or lesions on visualized portions of skin   Neurologic: No gross deficits   Previous notes and tests were reviewed. The plan was reviewed with the patient/family, and all questions/concerned  were addressed.  It was my pleasure to see Faten today and participate in her care. Please feel free to contact me with any questions or concerns.  Sincerely,  Ferol LuzEvelyn Shemeka Wardle, MD  Allergy & Immunology  [facility] [place] Office: [phone]

## 2022-04-11 NOTE — Patient Instructions (Addendum)
CVID (common variable immunodeficiency) with bronchiectasis Continue at home infusions once a month with Privigen. Keep track of infections. Last ABX in November  Start Prednisone 10mg  tablet pack: 2 tablets given in office today. Take 2 more tablets before bed today.  Then take 2 tablets twice a day for 2 more days. Then take 2 tablets once a day for 1 day. Then take 1 tablet once a day for 1 day.     Asthma-COPD overlap syndrome  Continue Vest therapy  Continue xopenox (levalbuterol) every 4-6 hours as needed  DECREASE smoking. Continue  budesonide (Pulmicort) 0.5mg  nebulizer for 1-2 weeks until your breathing symptoms return to baseline.  Daily controller medication(s): continue Symbicort 2 puffs twice a day with spacer and rinse mouth afterwards. Continue Spiriva 1.23mcg 2 puffs once a day. Continue Tezspire injections every 3 weeks.  During upper respiratory infections/flares:  Start budesonide (Pulmicort) 0.5mg  nebulizer twice a day for 1-2 weeks until your breathing symptoms return to baseline.  Call [facility] for antibiotics  Pretreat with albuterol 2 puffs or albuterol nebulizer.  If you need to use your albuterol nebulizer machine back to back within 15-30 minutes with no relief then please go to the ER/urgent care for further evaluation.  May use levoalbuterol rescue inhaler 2 puffs or nebulizer every 4 to 6 hours as needed for shortness of breath, chest tightness, coughing, and wheezing. May use levoalbuterol rescue inhaler 2 puffs 5 to 15 minutes prior to strenuous physical activities. Monitor frequency of use.  Asthma control goals:  Full participation in all desired activities (may need albuterol before activity) Albuterol use two times or less a week on average (not counting use with activity) Cough interfering with sleep two times or less a month Oral steroids no more than once a year No hospitalizations    Seasonal and perennial allergic rhinitis 2021 bloodwork  positive to cat, dog, grass, trees, ragweed.  STOP loratadine and nasal atrovent  Start carbinoxamine 4mg  twice a day  Continue with Singulair (montelukast) 10mg  daily at night. Start Rylatris 2 sprays per nostril daily  This replaces flonase and atrovent  Nasal saline spray (i.e., Simply Saline) or nasal saline lavage (i.e., NeilMed) is recommended as needed and prior to medicated nasal sprays. Continue environmental control measures.   Gastroesophageal reflux disease Continue lifestyle and dietary modifications. Continue omeprazole 20mg  twice a day.   Follow up: 2 months  Thank you so much for letting me partake in your care today.  Don't hesitate to reach out if you have any additional concerns!  2022, MD  [facility] [place] [place]

## 2022-04-13 ENCOUNTER — Other Ambulatory Visit (HOSPITAL_COMMUNITY): Payer: Self-pay

## 2022-04-13 ENCOUNTER — Other Ambulatory Visit: Payer: Self-pay

## 2022-04-13 MED ORDER — OLOPATADINE HCL 0.2 % OP SOLN: 1.0000 [drp] | Freq: Every day | OPHTHALMIC | 5 refills | Status: DC | PRN

## 2022-04-13 NOTE — Telephone Encounter (Signed)
Preferred medication per insurance is Levocetirizine, Cetirizine or Desloratadine tablets..  Please advise if patient has tried/failed these medications.

## 2022-04-13 NOTE — Telephone Encounter (Signed)
Patient calling to let us know that the carbinoxamine 4 mg was going to cost the patient 50.00 at [facility] [address] [place].  Patient stated she couldn't afford that. I called [facility] and courtney stated it needed a PA so I will send it to the rx prior authorization pool.  I will call [facility] to see if they are running it correctly before sending the PA request. [facility] is saying as well that the carbinoxamine 4 mg is not covered will send to rx prior authorization pool.

## 2022-04-14 ENCOUNTER — Telehealth: Payer: Self-pay

## 2022-04-14 ENCOUNTER — Telehealth: Payer: Self-pay | Admitting: Internal Medicine

## 2022-04-14 NOTE — Telephone Encounter (Signed)
Lm for pharmacy to call us back

## 2022-04-14 NOTE — Telephone Encounter (Signed)
Dr. Jac Canavan said she received antibiotic Amoxclav [phone] but she didn't know if you wanted her to start taking it now. She also said if she starts taking this she'll need a pill to prevent a yeast infection because this medication causes a yeast infection.  She also has a question about Carbinoxamine due to her insurance not covering it. Should we give her an alternative for the Carbinoxamine?  [name] [phone]

## 2022-04-14 NOTE — Telephone Encounter (Signed)
[facility]  [phone] called had a question about patients epi-pen please advise

## 2022-04-17 ENCOUNTER — Other Ambulatory Visit (HOSPITAL_COMMUNITY): Payer: Self-pay

## 2022-04-18 ENCOUNTER — Telehealth: Payer: Self-pay | Admitting: Internal Medicine

## 2022-04-18 MED ORDER — FLUCONAZOLE 150 MG PO TABS: ORAL_TABLET | ORAL | 0 refills | Status: DC

## 2022-04-18 NOTE — Telephone Encounter (Signed)
Left a message on answering machine for Kristi Allen to call the office back.  Delonna [phone]

## 2022-04-18 NOTE — Telephone Encounter (Signed)
[facility] called stating needed to go over medications prescribed to clarify contact number [phone] please advise

## 2022-04-18 NOTE — Telephone Encounter (Signed)
Preferred medication per insurance is Levocetirizine, Cetirizine or Desloratadine tablets..  Please advise if patient has tried/failed these medications. Patient has failed loratadine,levocetirizine,cetirizine

## 2022-04-18 NOTE — Telephone Encounter (Signed)
I definitely want her to take the antibiotic.  If she develops yeast infection symptoms we can call in some diflucan.   Im sorry carbinoxamine is not covered.  She can start zyzal 5mg  daily as the next best option.  She can increase this to twice a day as needed and tolerated.  Thanks!

## 2022-04-18 NOTE — Telephone Encounter (Signed)
Lachelle called to let us know that Niara called wanting diflucan bc she says she always gets a yeast infections when on an antibiotic. Lachelle also said the pharmacy called and said [facility] had duplicate medicines for albuterol, levalbuterol, I told Lachelle just to go by what Dr.Lomasney has in her take home after summary visit.

## 2022-04-19 NOTE — Telephone Encounter (Signed)
Called pharmacy back and informed them to stop loratadine and start Levalbuterol and Levocetirizine

## 2022-04-25 ENCOUNTER — Ambulatory Visit (INDEPENDENT_AMBULATORY_CARE_PROVIDER_SITE_OTHER): Payer: 59

## 2022-04-25 DIAGNOSIS — J4550 Severe persistent asthma, uncomplicated: Secondary | ICD-10-CM

## 2022-04-27 ENCOUNTER — Other Ambulatory Visit: Payer: Self-pay

## 2022-05-04 ENCOUNTER — Other Ambulatory Visit: Payer: Self-pay | Admitting: Internal Medicine

## 2022-05-09 ENCOUNTER — Other Ambulatory Visit: Payer: Self-pay | Admitting: Allergy

## 2022-05-09 ENCOUNTER — Telehealth: Payer: Self-pay

## 2022-05-09 NOTE — Telephone Encounter (Signed)
She needs tubing for her nebulizer machine and I gave her a sample of the tubing we had in house. I will call [facility] tomorrow for a new order for nebulize tubing. [facility] will mail it to her home. Patient states she was having a little respiratory flare up so I had told her to take her pulmicort 0.5 mg in her nebulizer twice daily for 1-2 weeks until her breathing is back to baseline. The [place] would be a step down so that would not be in her best interest. She can use her xopenex 1.25 mg in her nebulizer every 6 hours if needed for cough or wheeze.

## 2022-05-10 ENCOUNTER — Telehealth: Payer: Self-pay | Admitting: Internal Medicine

## 2022-05-10 NOTE — Telephone Encounter (Signed)
Patient wanting prednisone because she is having trouble breathing since yesterday and breathing worse today. She was told to call us when she is having trouble breathing. The tubing I gave her yesterday doesn't fit her nebulizer, so she has been using her rescue inhaler every 3 hours since yesterday. Will fax [facility] order for new nebulizer tubing once signed by Dr. Edison Pace. Patient will call [facility] tomorrow to see when her nebulizer tubing will arrive.

## 2022-05-10 NOTE — Telephone Encounter (Signed)
Spoke to [employer] and offered her an appointment for tomorrow and she can't come in tomorrow bc she is too busy and the nurse is coming out to give her injection tomorrow which takes 3-4 hours. I offered her a Friday appointment and she'll let us know. She says she's waiting on her nebulizer tubing from [place] in which we sent the order tonight. I told her if her breathing gets worse she will need to go to the emergency room. I also told patient that with her being fragile and having immunodeficency she really needs to come and be seen per Dr. Edison Pace

## 2022-05-10 NOTE — Telephone Encounter (Signed)
Thanks for coordinating this!

## 2022-05-10 NOTE — Telephone Encounter (Signed)
Pt request a call bk from [facility], she wants to know if her tubing was ordered for her machine, the tubing she got yesterday will not work on her machine.

## 2022-05-10 NOTE — Telephone Encounter (Signed)
Pharmacy is [facility] [address].

## 2022-05-11 NOTE — Telephone Encounter (Signed)
Spoke to [place] at [place]. The order was received and a driver is bringing her nebulize tubing to her house today. I asked Janett Billow how many tubing packages do they send out and she said 2. I tod Carlis when she uses her 2cd tube to call office so we can send new order for tubing so she won't be without nebulizer tubing again.

## 2022-05-11 NOTE — Telephone Encounter (Signed)
Called [facility] this morning and they received the order and someone from [place] was driving to patients home to deliver the tubing for her nebulizer machine. Patient can receive new tubing every 2 months.

## 2022-05-16 ENCOUNTER — Ambulatory Visit: Payer: 59

## 2022-05-16 ENCOUNTER — Ambulatory Visit (INDEPENDENT_AMBULATORY_CARE_PROVIDER_SITE_OTHER): Payer: 59

## 2022-05-16 DIAGNOSIS — J4550 Severe persistent asthma, uncomplicated: Secondary | ICD-10-CM

## 2022-05-25 ENCOUNTER — Other Ambulatory Visit: Payer: Self-pay | Admitting: Internal Medicine

## 2022-05-29 ENCOUNTER — Other Ambulatory Visit: Payer: Self-pay

## 2022-05-29 MED ORDER — IPRATROPIUM-ALBUTEROL 0.5-2.5 (3) MG/3ML IN SOLN: 3.0000 mL | Freq: Four times a day (QID) | RESPIRATORY_TRACT | 5 refills | Status: AC | PRN

## 2022-06-05 ENCOUNTER — Other Ambulatory Visit: Payer: Self-pay | Admitting: Internal Medicine

## 2022-06-06 ENCOUNTER — Ambulatory Visit (INDEPENDENT_AMBULATORY_CARE_PROVIDER_SITE_OTHER): Payer: 59

## 2022-06-06 DIAGNOSIS — J4550 Severe persistent asthma, uncomplicated: Secondary | ICD-10-CM

## 2022-06-27 ENCOUNTER — Ambulatory Visit (INDEPENDENT_AMBULATORY_CARE_PROVIDER_SITE_OTHER): Payer: 59

## 2022-06-27 DIAGNOSIS — J4550 Severe persistent asthma, uncomplicated: Secondary | ICD-10-CM

## 2022-07-11 ENCOUNTER — Other Ambulatory Visit: Payer: Self-pay | Admitting: Internal Medicine

## 2022-07-12 ENCOUNTER — Other Ambulatory Visit: Payer: Self-pay | Admitting: Internal Medicine

## 2022-07-12 NOTE — Telephone Encounter (Signed)
Kristi Allen will need to get her cholesterol and high blood pressure meds refilled by her primary care as I do not manage those.  Can someone let patient know?

## 2022-07-18 ENCOUNTER — Ambulatory Visit: Payer: 59

## 2022-07-19 ENCOUNTER — Ambulatory Visit (INDEPENDENT_AMBULATORY_CARE_PROVIDER_SITE_OTHER): Payer: 59

## 2022-07-19 ENCOUNTER — Ambulatory Visit: Payer: 59

## 2022-07-19 DIAGNOSIS — J4550 Severe persistent asthma, uncomplicated: Secondary | ICD-10-CM | POA: Diagnosis not present

## 2022-07-24 ENCOUNTER — Telehealth: Payer: Self-pay | Admitting: Internal Medicine

## 2022-07-24 NOTE — Telephone Encounter (Signed)
Patient asking for a call from the nurse some of her new medications is not working for her please advise

## 2022-07-24 NOTE — Telephone Encounter (Signed)
Patient states that she thinks her Xopenex HFA isn't helping her as well as the Albuterol HFA. She also thinks that the Claritin works better than the cetirizine. She would like both medication to be switched back. Please advise if it's ok to change.

## 2022-07-25 MED ORDER — ALBUTEROL SULFATE HFA 108 (90 BASE) MCG/ACT IN AERS: 2.0000 | INHALATION_SPRAY | RESPIRATORY_TRACT | 0 refills | Status: DC | PRN

## 2022-07-25 MED ORDER — LORATADINE 10 MG PO TABS: 10.0000 mg | ORAL_TABLET | Freq: Every day | ORAL | 5 refills | Status: DC

## 2022-07-25 NOTE — Telephone Encounter (Signed)
[facility]ol and claritin sent to [facility] for pt

## 2022-07-25 NOTE — Telephone Encounter (Signed)
Please send changed meds to [facility] pharmacy please advise

## 2022-07-25 NOTE — Telephone Encounter (Signed)
Okay to change back.  Thanks!

## 2022-07-26 ENCOUNTER — Telehealth: Payer: Self-pay | Admitting: Internal Medicine

## 2022-07-26 ENCOUNTER — Other Ambulatory Visit: Payer: Self-pay

## 2022-07-26 MED ORDER — ALBUTEROL SULFATE HFA 108 (90 BASE) MCG/ACT IN AERS: 2.0000 | INHALATION_SPRAY | RESPIRATORY_TRACT | 0 refills | Status: DC | PRN

## 2022-07-26 MED ORDER — LORATADINE 10 MG PO TABS: 10.0000 mg | ORAL_TABLET | Freq: Every day | ORAL | 5 refills | Status: DC

## 2022-07-26 NOTE — Telephone Encounter (Signed)
Patient is requesting a refill on albuterol, she needs refill to be sent to [facility]. She states she picked up the albuterol from [facility] today but it has no refills on it.

## 2022-07-26 NOTE — Telephone Encounter (Signed)
Refills sent in

## 2022-08-07 ENCOUNTER — Telehealth: Payer: Self-pay | Admitting: Internal Medicine

## 2022-08-07 ENCOUNTER — Other Ambulatory Visit: Payer: Self-pay | Admitting: Internal Medicine

## 2022-08-07 NOTE — Telephone Encounter (Signed)
Refill was sent in 07/26/22 idk why she wasn't able to get it lm for her to call me back about this

## 2022-08-07 NOTE — Telephone Encounter (Signed)
Patient called and stated needs a refill on medication albuterol albuterol (VENTOLIN HFA) 108 (90 Base) MCG/ACT inhaler to [id number] - [place], [place] [address] (Ph: [phone])

## 2022-08-08 NOTE — Telephone Encounter (Signed)
Left a message for pt to call us back about this

## 2022-08-09 ENCOUNTER — Ambulatory Visit (INDEPENDENT_AMBULATORY_CARE_PROVIDER_SITE_OTHER): Payer: 59

## 2022-08-09 DIAGNOSIS — J4550 Severe persistent asthma, uncomplicated: Secondary | ICD-10-CM

## 2022-08-09 MED ORDER — TEZEPELUMAB-EKKO 210 MG/1.91ML ~~LOC~~ SOSY
210.0000 mg | PREFILLED_SYRINGE | SUBCUTANEOUS | Status: AC
  Administered 2022-08-09 – 2024-05-28 (×27): 210 mg via SUBCUTANEOUS

## 2022-08-09 NOTE — Telephone Encounter (Signed)
Patient walked into office 

## 2022-08-09 NOTE — Telephone Encounter (Signed)
Pt verbally informed in person

## 2022-08-29 ENCOUNTER — Ambulatory Visit (INDEPENDENT_AMBULATORY_CARE_PROVIDER_SITE_OTHER): Payer: 59

## 2022-08-29 DIAGNOSIS — J4550 Severe persistent asthma, uncomplicated: Secondary | ICD-10-CM | POA: Diagnosis not present

## 2022-08-30 ENCOUNTER — Ambulatory Visit: Payer: 59

## 2022-09-02 ENCOUNTER — Other Ambulatory Visit: Payer: Self-pay | Admitting: Internal Medicine

## 2022-09-06 ENCOUNTER — Other Ambulatory Visit: Payer: Self-pay | Admitting: Internal Medicine

## 2022-09-18 ENCOUNTER — Other Ambulatory Visit: Payer: Self-pay | Admitting: Internal Medicine

## 2022-09-18 MED ORDER — BUDESONIDE-FORMOTEROL FUMARATE 80-4.5 MCG/ACT IN AERO: 2.0000 | INHALATION_SPRAY | Freq: Two times a day (BID) | RESPIRATORY_TRACT | 0 refills | Status: DC

## 2022-09-19 ENCOUNTER — Ambulatory Visit (INDEPENDENT_AMBULATORY_CARE_PROVIDER_SITE_OTHER): Payer: 59

## 2022-09-19 DIAGNOSIS — J4550 Severe persistent asthma, uncomplicated: Secondary | ICD-10-CM | POA: Diagnosis not present

## 2022-09-25 ENCOUNTER — Other Ambulatory Visit: Payer: Self-pay | Admitting: Internal Medicine

## 2022-10-04 ENCOUNTER — Other Ambulatory Visit: Payer: Self-pay | Admitting: Internal Medicine

## 2022-10-07 ENCOUNTER — Other Ambulatory Visit: Payer: Self-pay | Admitting: Internal Medicine

## 2022-10-08 ENCOUNTER — Other Ambulatory Visit: Payer: Self-pay | Admitting: Internal Medicine

## 2022-10-09 ENCOUNTER — Telehealth: Payer: Self-pay

## 2022-10-09 ENCOUNTER — Other Ambulatory Visit: Payer: Self-pay

## 2022-10-09 MED ORDER — ALBUTEROL SULFATE HFA 108 (90 BASE) MCG/ACT IN AERS: INHALATION_SPRAY | RESPIRATORY_TRACT | 0 refills | Status: DC

## 2022-10-09 NOTE — Telephone Encounter (Signed)
Lets walk her in tomorrow for an appointment when she comes in for her tezspire injection.

## 2022-10-09 NOTE — Telephone Encounter (Signed)
Patient calling to let us know she has been coughing now for 2 weeks, [facility] and was given no meds. The Dr. Had said she was having bronchitis, asthma flare up. She is using her albuterol hfa every 3 hours for cough, symbicort 80 mcg 2 puffs twice daily, pulmicort 0.5 mg once daily in her nebulizer. She does have tessalon pearls 100 mg. No fever. I am sending in a refill of ventolin hfa, she is aware of her tezspire injection tomorrow.

## 2022-10-09 NOTE — Telephone Encounter (Signed)
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 Thank you. [name] scheduled an appointment for 3:00 after she receives her tezspire at 2:00 tomorrow.

## 2022-10-10 ENCOUNTER — Ambulatory Visit (INDEPENDENT_AMBULATORY_CARE_PROVIDER_SITE_OTHER): Payer: 59 | Admitting: Family

## 2022-10-10 ENCOUNTER — Encounter: Payer: Self-pay | Admitting: Family

## 2022-10-10 ENCOUNTER — Ambulatory Visit: Payer: 59

## 2022-10-10 ENCOUNTER — Ambulatory Visit: Payer: 59 | Admitting: Internal Medicine

## 2022-10-10 ENCOUNTER — Other Ambulatory Visit: Payer: Self-pay

## 2022-10-10 VITALS — BP 108/66 | HR 83 | Temp 98.1°F | Resp 18 | Wt 141.0 lb

## 2022-10-10 DIAGNOSIS — J0100 Acute maxillary sinusitis, unspecified: Secondary | ICD-10-CM

## 2022-10-10 DIAGNOSIS — J302 Other seasonal allergic rhinitis: Secondary | ICD-10-CM

## 2022-10-10 DIAGNOSIS — J4550 Severe persistent asthma, uncomplicated: Secondary | ICD-10-CM | POA: Diagnosis not present

## 2022-10-10 DIAGNOSIS — J3089 Other allergic rhinitis: Secondary | ICD-10-CM

## 2022-10-10 DIAGNOSIS — D839 Common variable immunodeficiency, unspecified: Secondary | ICD-10-CM

## 2022-10-10 DIAGNOSIS — K219 Gastro-esophageal reflux disease without esophagitis: Secondary | ICD-10-CM

## 2022-10-10 DIAGNOSIS — J4489 Other specified chronic obstructive pulmonary disease: Secondary | ICD-10-CM

## 2022-10-10 DIAGNOSIS — Z720 Tobacco use: Secondary | ICD-10-CM

## 2022-10-10 MED ORDER — AMOXICILLIN-POT CLAVULANATE 875-125 MG PO TABS: ORAL_TABLET | ORAL | 0 refills | Status: DC

## 2022-10-10 MED ORDER — PREDNISONE 10 MG PO TABS: ORAL_TABLET | ORAL | 0 refills | Status: DC

## 2022-10-10 NOTE — Progress Notes (Signed)
[address] [place] [address] Dept: [phone]  FOLLOW UP NOTE  Patient ID: Kristi Allen, female    DOB: 12/30/1955  Age: 67 y.o. MRN: [id number] Date of Office Visit: 10/10/2022  Assessment  Chief Complaint: Follow-up and Cough (Used inhaler today)  HPI Kristi Allen is a 67 year old female who presents today for an acute visit being sick.  She was last seen on April 11, 2022 by Dr. Marlynn Perking for common variable immunodeficiency with bronchiectasis, asthma/COPD overlap syndrome, seasonal and perennial allergic rhinitis, and gastroesophageal reflux disease.  She denies any new diagnosis or surgery since her last office visit.  Common variable immunodeficiency: She continues to do once a month Privigen infusions.  She denies any problems or reactions with her infusions.  She does mention that once the infusions are running she does get chills.  She reports that the nurse told her that this was okay.  Since her last office visit she was placed on an antibiotic that started with the letter "D" for an infection on her right leg.  She reports that she had skin cancer removed from her thigh and was put on this antibiotic.  She has been off this antibiotic for several weeks.  Otherwise she has not been treated for any other infections.  Asthma-COPD overlap syndrome: She reports that she has not been doing vest therapy as much as she usually does because she does not feel good.  She continues to take Symbicort 160/4.5 mcg 2 puffs twice a day with a spacer, Spiriva 1.25 mcg 2 puffs once a day, and Tezspire injections every 4 weeks.  She also has Pulmicort 0.5 mg to use twice a day as needed for respiratory infections/flares and albuterol as needed.  She reports a productive cough with sputum that is yellow mixed with clear.  She feels congestion in her chest.  She also has wheezing, tightness in her chest, and shortness of breath. She has also had chills, but not checked for a fever.  The  other day she coughed up so much that she threw up.  She has been using her albuterol approximately every 3 hours.  Her breathing treatments help more than her inhaler.  She just used her albuterol inhaler before walking in the office.  Since her last office visit she has not made any trips to the emergency room or urgent care due to breathing problems.  She has not received any systemic steroids.  She denies any problems or reactions with her Tezspire injections.  She reports that they do help, but the week before she is due her asthma will flare.  She also mentions that last week she went to her primary care physician for the symptoms and was told that she had asthma/bronchitis/COPD and was not given anything.  She continues to smoke, but reports that she has cut back from 2 packs/day to 1 pack/day.  Seasonal and perennial allergic rhinitis: She is not certain if she has carbinoxamine or Ryaltris nasal spray.  She does take Singulair nasal spray and she thinks Claritin.  She also has a nasal spray that starts with the letter "N".  She reports rhinorrhea at times that is yellow, nasal congestion at times, and postnasal drip.  She has not been treated for any sinus infections since we last saw her.  She does use saline.  Gastroesophageal reflux disease.  She reports that she cannot tell if she has heartburn or reflux symptoms.  She takes omeprazole 20 mg twice a  day.   Drug Allergies:  Allergies  Allergen Reactions   Doxycycline Rash   Cyclobenzaprine    Hydrocortisone     Breaks out in hives with topical cream.    Sulfamethoxazole-Trimethoprim Swelling   Amoxicillin-Pot Clavulanate Nausea Only   Codeine Rash    Other reaction(s): RASH    Review of Systems: Review of Systems  Constitutional:  Positive for chills. Negative for fever.       Reports chills for the past 2 weeks.  She is not sure if she has a fever.  She has not checked  HENT:         Reports some yellow rhinorrhea at times,  nasal congestion at times, and postnasal drip.  She also reports sinus pressure across her cheek region  Eyes:        Denies itchy watery eyes  Respiratory:  Positive for cough, shortness of breath and wheezing.        Reports productive cough with yellow mixed with clear sputum, wheezing, tightness in her chest, and shortness of breath.  She also reports that she threw up the other day from coughing so much.  Cardiovascular:  Negative for chest pain and palpitations.  Gastrointestinal:        Cannot tell if she has heartburn or reflux symptoms  Skin:  Positive for itching. Negative for rash.       Reports bug bite on her right lower leg that she got yesterday that is itchy.  She reports yesterday that she squeezed out pus.  Neurological:  Positive for headaches.       Reports pain across her sinuses     Physical Exam: BP 108/66 (BP Location: Left Arm, Patient Position: Sitting, Cuff Size: Normal)   Pulse 83   Temp 98.1 F (36.7 C) (Temporal)   Resp 18   Wt 141 lb (64 kg)   SpO2 98%   BMI 26.64 kg/m    Physical Exam Constitutional:      Appearance: Normal appearance.  HENT:     Head: Normocephalic and atraumatic.     Comments: Pharynx normal, eyes normal, ears normal, nose normal    Right Ear: Tympanic membrane, ear canal and external ear normal.     Left Ear: Tympanic membrane, ear canal and external ear normal.     Nose: Nose normal.     Mouth/Throat:     Mouth: Mucous membranes are moist.     Pharynx: Oropharynx is clear.  Eyes:     Conjunctiva/sclera: Conjunctivae normal.  Cardiovascular:     Rate and Rhythm: Regular rhythm.     Heart sounds: Normal heart sounds.  Pulmonary:     Effort: Pulmonary effort is normal.     Comments: Rhonchi heard throughout all lung fields Musculoskeletal:     Cervical back: Neck supple.  Skin:    General: Skin is warm.     Comments: Slight erythema noted on anterior portion of right ankle with small amount of creamy color in the  center  Neurological:     Mental Status: She is alert and oriented to person, place, and time.  Psychiatric:        Mood and Affect: Mood normal.        Behavior: Behavior normal.        Thought Content: Thought content normal.        Judgment: Judgment normal.     Diagnostics: FVC 1.58 L (57%), FEV1 1.24 L (57%).  Predicted FVC 2.76 L, predicted FEV1  2.17 L.  Spirometry indicates possible moderately severe restriction.  Assessment and Plan: 1. Acute non-recurrent maxillary sinusitis   2. CVID (common variable immunodeficiency) (HCC)   3. Asthma-COPD overlap syndrome   4. Seasonal and perennial allergic rhinitis   5. Gastroesophageal reflux disease without esophagitis   6. Tobacco use     Meds ordered this encounter  Medications   amoxicillin-clavulanate (AUGMENTIN) 875-125 MG tablet    Sig: Take one tablet twice a day for 10 days    Dispense:  20 tablet    Refill:  0   predniSONE (DELTASONE) 10 MG tablet    Sig: Take 2 tablets twice a day for 3 days, then on the 4th day take 2 tablets in the morning and on the 5th day stop    Dispense:  15 tablet    Refill:  0    Patient Instructions  CVID (common variable immunodeficiency) with bronchiectasis Continue at home infusions once a month with Privigen. Keep track of infections. Last ABX in November  We will get lab work to check your immunoglobulins. We will call you with results once they are back We will start you on Augmentin 875 mg taking 1 tablet twice a day for 10 days due to sinus infection. This should also help the area on your right lower leg where you got bet. Let us know if you do not get better. Start prednisone 10 mg taking 2 tablets twice a day for 3 days, then on the 4th day take 2 tablets in the morning, and on the 5th day take 1 tablet and stop     Asthma-COPD overlap syndrome  Start prednisone as avove Continue Vest therapy  Continue xopenox (levalbuterol) every 4-6 hours as needed  DECREASE  smoking. Continue  budesonide (Pulmicort) 0.5mg  nebulizer for 1-2 weeks until your breathing symptoms return to baseline.  Daily controller medication(s): continue Symbicort 2 puffs twice a day with spacer and rinse mouth afterwards. Continue Spiriva 1.34mcg 2 puffs once a day. Continue Tezspire injections every 4 weeks.  During upper respiratory infections/flares:  Start budesonide (Pulmicort) 0.5mg  nebulizer twice a day for 1-2 weeks until your breathing symptoms return to baseline.  Call us for antibiotics  Pretreat with albuterol 2 puffs or albuterol nebulizer.  If you need to use your albuterol nebulizer machine back to back within 15-30 minutes with no relief then please go to the ER/urgent care for further evaluation.  May use levoalbuterol rescue inhaler 2 puffs or nebulizer every 4 to 6 hours as needed for shortness of breath, chest tightness, coughing, and wheezing. May use levoalbuterol rescue inhaler 2 puffs 5 to 15 minutes prior to strenuous physical activities. Monitor frequency of use.  Asthma control goals:  Full participation in all desired activities (may need albuterol before activity) Albuterol use two times or less a week on average (not counting use with activity) Cough interfering with sleep two times or less a month Oral steroids no more than once a year No hospitalizations    Seasonal and perennial allergic rhinitis Call us with the name of the medications you are taking for your allergies 2021 bloodwork positive to cat, dog, grass, trees, ragweed.  Continue with Singulair (montelukast) 10mg  daily at night. Continue Rylatris 2 sprays per nostril daily  Nasal saline spray (i.e., Simply Saline) or nasal saline lavage (i.e., NeilMed) is recommended as needed and prior to medicated nasal sprays. Continue environmental control measures.   Gastroesophageal reflux disease Continue lifestyle and dietary modifications. Continue omeprazole  20mg  twice a day.    Follow up:2  months or sooner if needed    Return in about 2 months (around 12/10/2022), or if symptoms worsen or fail to improve.    Thank you for the opportunity to care for this patient.  Please do not hesitate to contact me with questions.  Nehemiah Settle, FNP [facility]

## 2022-10-10 NOTE — Patient Instructions (Addendum)
CVID (common variable immunodeficiency) with bronchiectasis Continue at home infusions once a month with Privigen. Keep track of infections. Last ABX in November  We will get lab work to check your immunoglobulins. We will call you with results once they are back We will start you on Augmentin 875 mg taking 1 tablet twice a day for 10 days due to sinus infection. This should also help the area on your right lower leg where you got bet. Let us know if you do not get better. Start prednisone 10 mg taking 2 tablets twice a day for 3 days, then on the 4th day take 2 tablets in the morning, and on the 5th day take 1 tablet and stop     Asthma-COPD overlap syndrome  Start prednisone as avove Continue Vest therapy  Continue xopenox (levalbuterol) every 4-6 hours as needed  DECREASE smoking. Continue  budesonide (Pulmicort) 0.5mg  nebulizer for 1-2 weeks until your breathing symptoms return to baseline.  Daily controller medication(s): continue Symbicort 2 puffs twice a day with spacer and rinse mouth afterwards. Continue Spiriva 1.28mcg 2 puffs once a day. Continue Tezspire injections every 4 weeks.  During upper respiratory infections/flares:  Start budesonide (Pulmicort) 0.5mg  nebulizer twice a day for 1-2 weeks until your breathing symptoms return to baseline.  Call us for antibiotics  Pretreat with albuterol 2 puffs or albuterol nebulizer.  If you need to use your albuterol nebulizer machine back to back within 15-30 minutes with no relief then please go to the ER/urgent care for further evaluation.  May use levoalbuterol rescue inhaler 2 puffs or nebulizer every 4 to 6 hours as needed for shortness of breath, chest tightness, coughing, and wheezing. May use levoalbuterol rescue inhaler 2 puffs 5 to 15 minutes prior to strenuous physical activities. Monitor frequency of use.  Asthma control goals:  Full participation in all desired activities (may need albuterol before  activity) Albuterol use two times or less a week on average (not counting use with activity) Cough interfering with sleep two times or less a month Oral steroids no more than once a year No hospitalizations    Seasonal and perennial allergic rhinitis Call us with the name of the medications you are taking for your allergies 2021 bloodwork positive to cat, dog, grass, trees, ragweed.  Continue with Singulair (montelukast) 10mg  daily at night. Continue Rylatris 2 sprays per nostril daily  Nasal saline spray (i.e., Simply Saline) or nasal saline lavage (i.e., NeilMed) is recommended as needed and prior to medicated nasal sprays. Continue environmental control measures.   Gastroesophageal reflux disease Continue lifestyle and dietary modifications. Continue omeprazole 20mg  twice a day.   Follow up:2  months or sooner if needed

## 2022-10-11 LAB — IGG, IGA, IGM
IgA/Immunoglobulin A, Serum: 194 mg/dL (ref 87–352)
IgG (Immunoglobin G), Serum: 1534 mg/dL (ref 586–1602)
IgM (Immunoglobulin M), Srm: 68 mg/dL (ref 26–217)

## 2022-10-11 NOTE — Progress Notes (Signed)
Please let Kristi Allen know that her immunoglobulin levels look good!  How is she doing with the Augmentin that I prescribed ?

## 2022-10-20 ENCOUNTER — Other Ambulatory Visit: Payer: Self-pay | Admitting: Internal Medicine

## 2022-10-20 ENCOUNTER — Encounter: Payer: Self-pay | Admitting: Internal Medicine

## 2022-10-20 MED ORDER — ALBUTEROL SULFATE HFA 108 (90 BASE) MCG/ACT IN AERS: 1.0000 | INHALATION_SPRAY | RESPIRATORY_TRACT | 1 refills | Status: AC | PRN

## 2022-10-20 NOTE — Progress Notes (Signed)
Patient requesting a refill of albuterol. Reports her breathing has been worse the past few days and has noticed increased use of Albuterol.  She does think it is helping when she uses it. Thinks it is related to the high humidity and reports she has also been keeping it really warm in the house to save cost; discussed to avoid extremes of temperatures as that can flare up asthma.  Also discussed doing her routine inhalers and Pulmicort nebs as add on. If she is not better by Monday, she is to call us to schedule a follow up ASAP.  She voiced understanding.

## 2022-10-31 ENCOUNTER — Ambulatory Visit (INDEPENDENT_AMBULATORY_CARE_PROVIDER_SITE_OTHER): Payer: 59

## 2022-10-31 DIAGNOSIS — J4550 Severe persistent asthma, uncomplicated: Secondary | ICD-10-CM

## 2022-11-07 ENCOUNTER — Other Ambulatory Visit: Payer: Self-pay | Admitting: Internal Medicine

## 2022-11-13 ENCOUNTER — Other Ambulatory Visit: Payer: Self-pay | Admitting: Internal Medicine

## 2022-11-21 ENCOUNTER — Ambulatory Visit: Payer: 59

## 2022-11-21 ENCOUNTER — Other Ambulatory Visit: Payer: Self-pay | Admitting: Internal Medicine

## 2022-11-22 ENCOUNTER — Ambulatory Visit (INDEPENDENT_AMBULATORY_CARE_PROVIDER_SITE_OTHER): Payer: 59

## 2022-11-22 DIAGNOSIS — J4550 Severe persistent asthma, uncomplicated: Secondary | ICD-10-CM

## 2022-11-24 ENCOUNTER — Ambulatory Visit (INDEPENDENT_AMBULATORY_CARE_PROVIDER_SITE_OTHER): Payer: 59 | Admitting: Family

## 2022-11-24 ENCOUNTER — Other Ambulatory Visit: Payer: Self-pay

## 2022-11-24 ENCOUNTER — Encounter: Payer: Self-pay | Admitting: Family

## 2022-11-24 VITALS — BP 132/88 | HR 76 | Temp 97.8°F | Resp 16 | Wt 141.0 lb

## 2022-11-24 DIAGNOSIS — J4489 Other specified chronic obstructive pulmonary disease: Secondary | ICD-10-CM

## 2022-11-24 DIAGNOSIS — D839 Common variable immunodeficiency, unspecified: Secondary | ICD-10-CM | POA: Diagnosis not present

## 2022-11-24 DIAGNOSIS — J302 Other seasonal allergic rhinitis: Secondary | ICD-10-CM

## 2022-11-24 DIAGNOSIS — Z720 Tobacco use: Secondary | ICD-10-CM

## 2022-11-24 DIAGNOSIS — J3089 Other allergic rhinitis: Secondary | ICD-10-CM | POA: Diagnosis not present

## 2022-11-24 DIAGNOSIS — K219 Gastro-esophageal reflux disease without esophagitis: Secondary | ICD-10-CM | POA: Diagnosis not present

## 2022-11-24 MED ORDER — ALBUTEROL SULFATE HFA 108 (90 BASE) MCG/ACT IN AERS: INHALATION_SPRAY | RESPIRATORY_TRACT | 3 refills | Status: AC

## 2022-11-24 MED ORDER — OLOPATADINE HCL 0.2 % OP SOLN: OPHTHALMIC | 5 refills | Status: AC

## 2022-11-24 MED ORDER — FLUCONAZOLE 150 MG PO TABS: ORAL_TABLET | ORAL | 0 refills | Status: DC

## 2022-11-24 MED ORDER — AMOXICILLIN-POT CLAVULANATE 875-125 MG PO TABS: ORAL_TABLET | ORAL | 0 refills | Status: DC

## 2022-11-24 MED ORDER — PREDNISONE 10 MG PO TABS: ORAL_TABLET | ORAL | 0 refills | Status: DC

## 2022-11-24 NOTE — Progress Notes (Unsigned)
[address] [place] [address] Dept: [phone]  FOLLOW UP NOTE  Patient ID: Kristi Allen, female    DOB: 1955-06-01  Age: 67 y.o. MRN: [id number] Date of Office Visit: 11/24/2022  Assessment  Chief Complaint: Follow-up and Cough (Cough with mucus)  HPI Kristi Allen is a 67 year old female who presents today for follow-up of acute nonrecurrent maxillary sinusitis, common variable immunodeficiency, asthma-COPD overlap syndrome, seasonal and perennial allergic rhinitis, gastroesophageal reflux disease, and tobacco use.  She was last seen by myself on October 10, 2022.  She denies any new diagnosis or surgery since her last office visit.  Common variable immunodeficiency: She continues to receive Privigen injections once a month.  Her last infusion was this Monday.  She reports that she has not received any other antibiotics since her last office visit with [place].  She does mention that the Augmentin and prednisone she was prescribed at the last office visit did good for a while.  Her immunoglobulin dose on October 10, 2022 were all within normal limits.  Asthma-COPD overlap syndrome: She reports that a week ago she started having a hard achy cough that was at times productive.  What she coughs up is brown in color.  The cough is making her back hurt and she cannot sleep good due to the cough.  She also reports wheezing, tightness in her chest, shortness of breath.  She also reports chills felt cold last night and today.  She continues to use vest therapy when she feels up to it.  She also takes Symbicort 160/4.5 mcg 2 puffs twice a day with a spacer, Spiriva Respimat 1.25 mcg 2 puffs once a day, Tezspire injections every 3 weeks, and albuterol as needed.  She reports that she has been using her albuterol approximately every 3 hours or sooner.  She reports that when she uses her albuterol via her nebulizer that it helps more.  She continues to smoke 1 and half packs per day.  She is trying to work  on quitting, but mentions it is hard.  She wonders if she can get her test prior injections every 2 weeks rather than 3 weeks.  She got her last test by her injection this Wednesday.  Since her last office visit she has not required any systemic steroids or made any trips to the emergency room or urgent care due to breathing problems.  She has noticed that the hot humid air affects her breathing also.  The other day she went outside and it took her breath away.  She went inside and took her a few minutes to get her breath back.  During that time she could not talk and had watery eyes.  Seasonal and perennial allergic rhinitis: She reports headaches, runny nose in the morning that is been brown in color for the past week, nasal congestion, irritated throat, and some postnasal drip.  She continues to take Singulair 10 mg at night, saline spray daily, and Ryaltris nasal spray daily.  She has not been treated for any sinus infections since we last saw her.  She is requesting a refill of Pataday due to itchy watery eyes.  Gastroesophageal reflux disease: She continues to take omeprazole 20 mg twice a day.  She reports that she had 1 episode of bad heartburn on Monday.  Otherwise she is any issues with reflux.  She request a prescription for yeast infection treatment if we send in an antibiotic.   Drug Allergies:  Allergies  Allergen  Reactions   Doxycycline Rash   Cyclobenzaprine    Hydrocortisone     Breaks out in hives with topical cream.    Sulfamethoxazole-Trimethoprim Swelling   Amoxicillin-Pot Clavulanate Nausea Only   Codeine Rash    Other reaction(s): RASH    Review of Systems: Negative except as per HPI   Physical Exam: BP 132/88 (BP Location: Left Arm, Patient Position: Sitting, Cuff Size: Normal)   Pulse 76   Temp 97.8 F (36.6 C) (Temporal)   Resp 16   Wt 141 lb (64 kg)   SpO2 98%   BMI 26.64 kg/m    Physical Exam Constitutional:      Appearance: Normal appearance.   HENT:     Head: Normocephalic and atraumatic.     Comments: Pharynx normal. Eyes normal. Eyes normal. Ears normal    Right Ear: Tympanic membrane, ear canal and external ear normal.     Left Ear: Tympanic membrane, ear canal and external ear normal.     Nose: Nose normal.     Mouth/Throat:     Mouth: Mucous membranes are moist.     Pharynx: Oropharynx is clear.  Eyes:     Conjunctiva/sclera: Conjunctivae normal.  Cardiovascular:     Rate and Rhythm: Regular rhythm.     Heart sounds: Normal heart sounds.  Pulmonary:     Effort: Pulmonary effort is normal.     Comments: Rhonchi heard throughout that clears after coughing Musculoskeletal:     Cervical back: Neck supple.  Skin:    General: Skin is warm.  Neurological:     Mental Status: She is alert and oriented to person, place, and time.  Psychiatric:        Mood and Affect: Mood normal.        Behavior: Behavior normal.        Thought Content: Thought content normal.        Judgment: Judgment normal.     Diagnostics: FVC 1.60 L (58%), FEV1 1.34 L ( 62%). Predicted FVC 2.76 L, predicted FEV1 2.16 L. Spirometry indicates possible moderate restriction   Assessment and Plan: 1. CVID (common variable immunodeficiency) (HCC)   2. Asthma-COPD overlap syndrome   3. Seasonal and perennial allergic rhinitis   4. Gastroesophageal reflux disease without esophagitis   5. Tobacco use     Meds ordered this encounter  Medications   albuterol (VENTOLIN HFA) 108 (90 Base) MCG/ACT inhaler    Sig: INHALE 2 PUFFS INTO THE LUNGS EVERY 4 HOURS AS NEEDED FOR WHEEZING OR SHORTNESS OF BREATH    Dispense:  8.5 g    Refill:  3   amoxicillin-clavulanate (AUGMENTIN) 875-125 MG tablet    Sig: Take one tablet twice a day for 10 days    Dispense:  20 tablet    Refill:  0   predniSONE (DELTASONE) 10 MG tablet    Sig: Take two tablets twice a day for 3 days, then on the 4th day take 2 tablets in the morning and on the 5th day take one tablet in  the morning and stop    Dispense:  15 tablet    Refill:  0   fluconazole (DIFLUCAN) 150 MG tablet    Sig: Take one tablet onset of itching    Dispense:  1 tablet    Refill:  0   Olopatadine HCl 0.2 % SOLN    Sig: Place one drop in each eye once a day as needed for itchy eyes    Dispense:  2.5 mL    Refill:  5    Patient Instructions  CVID (common variable immunodeficiency) with bronchiectasis Continue at home infusions once a month with Privigen. Keep track of infections. Last ABX in June   Asthma-COPD overlap syndrome with exacerbation Start Augmentin 875 mg taking 1 tablet twice a day for 10 days Start prednisone 10 mg taking 2 tablets twice a day for 3 days, then on the 4th day take 2 tablets in the morning, and on the 5th day take one tablet and stop Prescription sent for Diflucan 150 mg- take one tablet at onset of itching Continue Vest therapy  Continue xopenox (levalbuterol) every 4-6 hours as needed  DECREASE smoking.  Daily controller medication(s): continue Symbicort 2 puffs twice a day with spacer and rinse mouth afterwards. Continue Spiriva 1.2mcg 2 puffs once a day. Continue Tezspire injections every 4 weeks.  During upper respiratory infections/flares:  Start budesonide (Pulmicort) 0.5mg  nebulizer twice a day for 1-2 weeks until your breathing symptoms return to baseline.  Call us for antibiotics  Pretreat with albuterol 2 puffs or albuterol nebulizer.  If you need to use your albuterol nebulizer machine back to back within 15-30 minutes with no relief then please go to the ER/urgent care for further evaluation.  May use levoalbuterol rescue inhaler 2 puffs or nebulizer every 4 to 6 hours as needed for shortness of breath, chest tightness, coughing, and wheezing. May use levoalbuterol rescue inhaler 2 puffs 5 to 15 minutes prior to strenuous physical activities. Monitor frequency of use.  Asthma control goals:  Full participation in all desired activities  (may need albuterol before activity) Albuterol use two times or less a week on average (not counting use with activity) Cough interfering with sleep two times or less a month Oral steroids no more than once a year No hospitalizations    Seasonal and perennial allergic rhinitis Call us with the name of the medications you are taking for your allergies 2021 bloodwork positive to cat, dog, grass, trees, ragweed.  Continue with Singulair (montelukast) 10mg  daily at night. Continue Rylatris 2 sprays per nostril daily  Nasal saline spray (i.e., Simply Saline) or nasal saline lavage (i.e., NeilMed) is recommended as needed and prior to medicated nasal sprays. Continue environmental control measures.   Gastroesophageal reflux disease Continue lifestyle and dietary modifications. Continue omeprazole 20mg  twice a day.   Follow up: 4-6 weeks with Dr. Marlynn Perking or sooner if needed   Return in about 4 weeks (around 12/22/2022), or if symptoms worsen or fail to improve.    Thank you for the opportunity to care for this patient.  Please do not hesitate to contact me with questions.  Nehemiah Settle, FNP [facility]

## 2022-11-24 NOTE — Patient Instructions (Addendum)
CVID (common variable immunodeficiency) with bronchiectasis Continue at home infusions once a month with Privigen. Keep track of infections. Last ABX in June   Asthma-COPD overlap syndrome with exacerbation Start Augmentin 875 mg taking 1 tablet twice a day for 10 days Start prednisone 10 mg taking 2 tablets twice a day for 3 days, then on the 4th day take 2 tablets in the morning, and on the 5th day take one tablet and stop Prescription sent for Diflucan 150 mg- take one tablet at onset of itching Continue Vest therapy  Continue xopenox (levalbuterol) every 4-6 hours as needed  DECREASE smoking.  Daily controller medication(s): continue Symbicort 2 puffs twice a day with spacer and rinse mouth afterwards. Continue Spiriva 1.33mcg 2 puffs once a day. Continue Tezspire injections every 4 weeks.  During upper respiratory infections/flares:  Start budesonide (Pulmicort) 0.5mg  nebulizer twice a day for 1-2 weeks until your breathing symptoms return to baseline.  Call us for antibiotics  Pretreat with albuterol 2 puffs or albuterol nebulizer.  If you need to use your albuterol nebulizer machine back to back within 15-30 minutes with no relief then please go to the ER/urgent care for further evaluation.  May use levoalbuterol rescue inhaler 2 puffs or nebulizer every 4 to 6 hours as needed for shortness of breath, chest tightness, coughing, and wheezing. May use levoalbuterol rescue inhaler 2 puffs 5 to 15 minutes prior to strenuous physical activities. Monitor frequency of use.  Asthma control goals:  Full participation in all desired activities (may need albuterol before activity) Albuterol use two times or less a week on average (not counting use with activity) Cough interfering with sleep two times or less a month Oral steroids no more than once a year No hospitalizations    Seasonal and perennial allergic rhinitis-not well controlled Call us with the name of the medications you are  taking for your allergies 2021 bloodwork positive to cat, dog, grass, trees, ragweed.  Continue with Singulair (montelukast) 10mg  daily at night. Continue Rylatris 2 sprays per nostril daily  Nasal saline spray (i.e., Simply Saline) or nasal saline lavage (i.e., NeilMed) is recommended as needed and prior to medicated nasal sprays. Continue environmental control measures.   Gastroesophageal reflux disease Continue lifestyle and dietary modifications. Continue omeprazole 20mg  twice a day.   Follow up: 4-6 weeks with Dr. Marlynn Perking or sooner if needed

## 2022-12-13 ENCOUNTER — Ambulatory Visit (INDEPENDENT_AMBULATORY_CARE_PROVIDER_SITE_OTHER): Payer: 59

## 2022-12-13 DIAGNOSIS — J4550 Severe persistent asthma, uncomplicated: Secondary | ICD-10-CM | POA: Diagnosis not present

## 2022-12-14 ENCOUNTER — Ambulatory Visit: Payer: 59 | Admitting: Family

## 2022-12-22 ENCOUNTER — Telehealth: Payer: Self-pay | Admitting: Internal Medicine

## 2022-12-22 MED ORDER — NEBULIZER/TUBING/MOUTHPIECE KIT: PACK | 1 refills | Status: AC

## 2022-12-22 NOTE — Telephone Encounter (Signed)
Please see other encounter.

## 2022-12-22 NOTE — Telephone Encounter (Signed)
Sent in rx for tubing to [facility] pt informed

## 2022-12-22 NOTE — Telephone Encounter (Signed)
Patient is requesting a call back about tubing for her nebulizer.

## 2022-12-22 NOTE — Telephone Encounter (Signed)
Patient needs a replacement holes power nebultra breathing machine. Patients last one broke while using machine, Patient would like for nurse to call [facility] in [place] to order her more holes. Please call patient at earlier est convenience.

## 2022-12-25 ENCOUNTER — Other Ambulatory Visit: Payer: Self-pay | Admitting: Allergy

## 2022-12-27 ENCOUNTER — Ambulatory Visit: Payer: 59 | Admitting: Internal Medicine

## 2023-01-03 ENCOUNTER — Ambulatory Visit (INDEPENDENT_AMBULATORY_CARE_PROVIDER_SITE_OTHER): Payer: 59 | Admitting: Internal Medicine

## 2023-01-03 ENCOUNTER — Encounter: Payer: Self-pay | Admitting: Internal Medicine

## 2023-01-03 ENCOUNTER — Ambulatory Visit: Payer: 59

## 2023-01-03 VITALS — BP 120/70 | HR 77 | Temp 98.1°F | Resp 20

## 2023-01-03 DIAGNOSIS — D839 Common variable immunodeficiency, unspecified: Secondary | ICD-10-CM

## 2023-01-03 DIAGNOSIS — K219 Gastro-esophageal reflux disease without esophagitis: Secondary | ICD-10-CM

## 2023-01-03 DIAGNOSIS — J4550 Severe persistent asthma, uncomplicated: Secondary | ICD-10-CM | POA: Diagnosis not present

## 2023-01-03 DIAGNOSIS — Z720 Tobacco use: Secondary | ICD-10-CM

## 2023-01-03 DIAGNOSIS — H6691 Otitis media, unspecified, right ear: Secondary | ICD-10-CM | POA: Diagnosis not present

## 2023-01-03 DIAGNOSIS — J302 Other seasonal allergic rhinitis: Secondary | ICD-10-CM

## 2023-01-03 DIAGNOSIS — J0100 Acute maxillary sinusitis, unspecified: Secondary | ICD-10-CM

## 2023-01-03 DIAGNOSIS — J3089 Other allergic rhinitis: Secondary | ICD-10-CM

## 2023-01-03 MED ORDER — AMOXICILLIN-POT CLAVULANATE 875-125 MG PO TABS: 1.0000 | ORAL_TABLET | Freq: Two times a day (BID) | ORAL | 0 refills | Status: AC

## 2023-01-03 MED ORDER — PREDNISONE 10 MG PO TABS: ORAL_TABLET | ORAL | 0 refills | Status: DC

## 2023-01-03 MED ORDER — AMOXICILLIN-POT CLAVULANATE 875-125 MG PO TABS: 1.0000 | ORAL_TABLET | Freq: Two times a day (BID) | ORAL | 0 refills | Status: DC

## 2023-01-03 NOTE — Progress Notes (Signed)
Follow Up Note  RE: Kristi Allen MRN: [id number] DOB: 14-Jan-1956 Date of Office Visit: 01/03/2023  Referring provider: List, Nash Shearer, FNP Primary care provider: List, Nash Shearer, FNP  Chief Complaint: Asthma  History of Present Illness: I had the pleasure of seeing Kristi Allen for a follow up visit at the [facility] on 01/03/2023. She is a 67 y.o. female, who is being followed for CVID, persistent asthma, allergic rhinitis, GERD . Her previous allergy office visit was on 11/24/22 with Nehemiah Settle FNP. Today is a  acute visit  .  History obtained from patient, chart review.    She is on tezpire, symbicort and spirivia.  She is on privigen for IVIG.     History:   Asthma/ copd overlap syndrome Past history - Patient used to follow with pulmonology as well.  Normal alpha-1 level. Eosinophils 100, IgE 458.   -On vest therapy at last visit.  Manual CPT was considered however due to advanced disease she would not tolerate positioning.   -Chest CT 2023: IMPRESSION: 1. Mild central bronchiectasis and bronchial wall thickening in keeping with airway inflammation. Mucoid impaction within several segmental bronchi of the left upper lobe. 2. Mild multi-vessel coronary artery calcification. 3. Sparse ground-glass opacity within the right lower lobe may be infectious or inflammatory in nature. 4.  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 She mentions skipping few doses at times due to inability to self-administer due to her coughing fits.  -Last antibiotics in November 2023  -Prefers IVIG over SCIG -CBC//CMP/IgG: normal at goal 03/14/22    Seasonal and perennial allergic rhinitis Past history - 2021 bloodwork positive to cat, dog, grass, trees, ragweed.  Interim history - increased post  nasal drip triggering cough Currently on Singulair 10 mg daily, Flonase 1 spray per nostril twice a day, Atrovent 1 to 2 sprays per nostril daily.     GERD  - on omeprazole 20mg  twice daily    Today She reports:  Over the last week she has had increased coughing, rhinnorhea, sneezing, itchy watery eyes, right ear pain.  No fevers, Sleep disturbed by coughing.  She is been using levalbuterol and nebulized pulmicort twice a day for the past 2-3 days for coughing.  She has been compliant with primigen and tezspire,  Been compliant with symbicort 160mg  2 puffs twice daily, Spiriva 2 puffs daily,  montelukast 10mg  daily, ryaltris.  Last ABX in July 2024 - did feel better afterwards until this past week.  GERD controlled on omeprazole 20mg  twice daily.   Assessment and Plan: Kristi Allen is a 67 y.o. female with: Acute non-recurrent maxillary sinusitis  Right acute otitis media  Severe persistent asthma without complication  CVID (common variable immunodeficiency) (HCC)  Gastroesophageal reflux disease without esophagitis  Seasonal and perennial allergic rhinitis  Tobacco use   Plan: Patient Instructions  CVID (common variable immunodeficiency) with bronchiectasis Continue at home infusions once a month with Privigen. Keep track of infections. Last ABX in 11/2022   Asthma-COPD overlap syndrome with sinus infection and right acute otitis media  Covid test negative   Start Augmentin 875 mg taking 1 tablet twice a day for 10 days Start prednisone 10 mg taking 2 tablets twice a day for 3 days, then on the 4th day take 2 tablets in the morning, and on the  5th day take one tablet and stop  Continue Vest therapy  Continue xopenox (levalbuterol) every 4-6 hours as needed  DECREASE smoking.  Daily controller medication(s): continue Symbicort 2 puffs twice a day with spacer and rinse mouth afterwards. Continue Spiriva 1.43mcg 2 puffs once a day. Continue Tezspire injections every 4  weeks.  During upper respiratory infections/flares:  Start budesonide (Pulmicort) 0.5mg  nebulizer twice a day for 1-2 weeks until your breathing symptoms return to baseline.  Call us for antibiotics  Pretreat with albuterol 2 puffs or albuterol nebulizer.  If you need to use your albuterol nebulizer machine back to back within 15-30 minutes with no relief then please go to the ER/urgent care for further evaluation.  May use levoalbuterol rescue inhaler 2 puffs or nebulizer every 4 to 6 hours as needed for shortness of breath, chest tightness, coughing, and wheezing. May use levoalbuterol rescue inhaler 2 puffs 5 to 15 minutes prior to strenuous physical activities. Monitor frequency of use.  Asthma control goals:  Full participation in all desired activities (may need albuterol before activity) Albuterol use two times or less a week on average (not counting use with activity) Cough interfering with sleep two times or less a month Oral steroids no more than once a year No hospitalizations    Seasonal and perennial allergic rhinitis- 2021 bloodwork positive to cat, dog, grass, trees, ragweed.  Continue with Singulair (montelukast) 10mg  daily at night. Continue Rylatris 2 sprays per nostril daily  Nasal saline spray (i.e., Simply Saline) or nasal saline lavage (i.e., NeilMed) is recommended as needed and prior to medicated nasal sprays. Continue environmental control measures.   Gastroesophageal reflux disease Continue lifestyle and dietary modifications. Continue omeprazole 20mg  twice a day.   Follow up: 3 months   Thank you so much for letting me partake in your care today.  Don't hesitate to reach out if you have any additional concerns!  Ferol Luz, MD  [facility] [place] [place]    Meds ordered this encounter  Medications   DISCONTD: amoxicillin-clavulanate (AUGMENTIN) 875-125 MG tablet    Sig: Take 1 tablet by mouth 2 (two) times daily for 10 days.     Dispense:  20 tablet    Refill:  0   DISCONTD: predniSONE (DELTASONE) 10 MG tablet    Sig: Take two tablets twice a day for 3 days, then on the 4th day take 2 tablets in the morning and on the 5th day take one tablet in the morning and stop    Dispense:  15 tablet    Refill:  0   predniSONE (DELTASONE) 10 MG tablet    Sig: Take two tablets twice a day for 3 days, then on the 4th day take 2 tablets in the morning and on the 5th day take one tablet in the morning and stop    Dispense:  15 tablet    Refill:  0   amoxicillin-clavulanate (AUGMENTIN) 875-125 MG tablet    Sig: Take 1 tablet by mouth 2 (two) times daily for 10 days.    Dispense:  20 tablet    Refill:  0    Lab Orders  No laboratory test(s) ordered today   Diagnostics: Rapid Covid test: negative    Medication List:  Current Outpatient Medications  Medication Sig Dispense Refill   albuterol (PROVENTIL) (2.5 MG/3ML) 0.083% nebulizer solution USE ONE VIAL IN NEBULIZER EVERY 6 HOURS AS NEEDED FOR WHEEZING OR SHORTNESS OF BREATH 75 mL 1  albuterol (VENTOLIN HFA) 108 (90 Base) MCG/ACT inhaler Inhale 1-2 puffs into the lungs every 4 (four) hours as needed for wheezing or shortness of breath. INHALE TWO PUFFS INTO THE LUNGS EVERY 4 HOURS AS NEEDED FOR WHEEZING OR SHORTNESS OF BREATH 18 g 1   albuterol (VENTOLIN HFA) 108 (90 Base) MCG/ACT inhaler INHALE 2 PUFFS INTO THE LUNGS EVERY 4 HOURS AS NEEDED FOR WHEEZING OR SHORTNESS OF BREATH 8.5 g 3   amLODipine (NORVASC) 10 MG tablet Take 10 mg by mouth daily.     amoxicillin-clavulanate (AUGMENTIN) 875-125 MG tablet Take one tablet twice a day for 10 days 20 tablet 0   atorvastatin (LIPITOR) 10 MG tablet Take 10 mg by mouth daily.     budesonide (PULMICORT) 0.5 MG/2ML nebulizer solution USE ONE VIAL IN NEBULIZER IN THE MORNING AND AT BEDTIME ONLY AS NEEDED 120 mL 5   buPROPion (WELLBUTRIN XL) 150 MG 24 hr tablet Take 1 tablet by mouth daily.     busPIRone (BUSPAR) 10 MG tablet Take by  mouth.     Calcium Carb-Cholecalciferol (CALCIUM CARBONATE-VITAMIN D3) 600-400 MG-UNIT TABS TAKE ONE TABLET BY MOUTH 2 TIMES A DAY     Carbinoxamine Maleate 4 MG TABS Take 1 tablet (4 mg total) by mouth in the morning and at bedtime. 60 tablet 3   cetirizine (ZYRTEC) 10 MG tablet Take one tablet once daily for runny nose or itchy eyes 30 tablet 5   Cholecalciferol (VITAMIN D3) 50 MCG (2000 UT) TABS Take by mouth.     EPINEPHrine (EPIPEN 2-PAK) 0.3 mg/0.3 mL IJ SOAJ injection Use as needed for severe allergic reactions. 2 each 3   fluconazole (DIFLUCAN) 150 MG tablet Take one tablet onset of itching 1 tablet 0   fluticasone (FLONASE) 50 MCG/ACT nasal spray USE ONE SPRAY IN BOTH NOSTRILS EVERY MORNING AND AT BEDTIME FOR DRAINAGE 16 g 5   hydrochlorothiazide (HYDRODIURIL) 25 MG tablet Take 25 mg by mouth daily.     Immune Globulin, Human, (PRIVIGEN IV) Inject 30 mg into the vein every 30 (thirty) days.     ipratropium (ATROVENT) 0.03 % nasal spray PLACE 1 TO 2 SPRAYS INTO EACH NOSTRIL TWICE DAILY AS NEEDED FOR NASAL DRAINAGE. 30 mL 5   ipratropium-albuterol (DUONEB) 0.5-2.5 (3) MG/3ML SOLN Take 3 mLs by nebulization every 6 (six) hours as needed. 360 mL 5   levalbuterol (XOPENEX HFA) 45 MCG/ACT inhaler INHALE 2 PUFFS EVERY 4 HOURS AS NEEDED FOR WHEEZING AND SHORTNESS OF BREATH 15 g 1   levocetirizine (XYZAL) 5 MG tablet TAKE ONE TABLET BY MOUTH DAILY AS NEEDED FOR ALLERGIES 30 tablet 5   loratadine (ALLERGY RELIEF) 10 MG tablet Take 1 tablet (10 mg total) by mouth daily. 30 tablet 5   loratadine (CLARITIN) 10 MG tablet Take 1 tablet (10 mg total) by mouth daily. 30 tablet 5   losartan (COZAAR) 50 MG tablet Take 1 tablet by mouth daily.     montelukast (SINGULAIR) 10 MG tablet Take 1 tablet (10 mg total) by mouth at bedtime. 30 tablet 11   Olopatadine HCl 0.2 % SOLN Place one drop in each eye once a day as needed for itchy eyes 2.5 mL 5   Olopatadine-Mometasone (RYALTRIS) 665-25 MCG/ACT SUSP Place 2  sprays into the nose in the morning and at bedtime. 29 g 5   omeprazole (PRILOSEC) 20 MG capsule Take 20 mg by mouth 2 (two) times daily.     predniSONE (DELTASONE) 10 MG tablet Take 2 tablets twice  a day for 3 days, then on the 4th day take 2 tablets in the morning and on the 5th day stop 15 tablet 0   Respiratory Therapy Supplies (NEBULIZER/TUBING/MOUTHPIECE) KIT Use with nebulizer 1 kit 1   SYMBICORT 80-4.5 MCG/ACT inhaler INHALE TWO PUFFS EVERY MORNING AND AT BEDTIME. USE SPACER AND RINSE MOUTH AFTERWARDS 10.2 g 5   tezepelumab-ekko (TEZSPIRE) 210 MG/1. syringe INJECT 210MG  SUBCUTANEOUSLY  EVERY 4 WEEKS TO BE ADMINISTERED BY A HEALTHCARE PROVIDER IN A  HEALTHCARE SETTING 1.91 mL 11   tezepelumab-ekko (TEZSPIRE) 210 MG/1. syringe Inject 1.91 mLs (210 mg total) into the skin every 28 (twenty-eight) days. 1.91 mL 11   tezepelumab-ekko (TEZSPIRE) 210 MG/1. syringe INJECT 210MG  SUBCUTANEOUSLY  EVERY 4 WEEKS 1.91 mL 11   Tiotropium Bromide Monohydrate (SPIRIVA RESPIMAT) 1.25 MCG/ACT AERS INHALE TWO PUFFS INTO THE LUNGS EVERY DAY 4 g 5   traZODone (DESYREL) 50 MG tablet Take 50 mg by mouth at bedtime.     VENTOLIN HFA 108 (90 Base) MCG/ACT inhaler INHALE TWO PUFFS INTO THE LUNGS EVERY 4 HOURS AS NEEDED FOR WHEEZING OR SHORTNESS OF BREATH 18 g 11   vitamin C (ASCORBIC ACID) 500 MG tablet Take 500 mg by mouth daily.     Zinc 50 MG CAPS Take by mouth.     amoxicillin-clavulanate (AUGMENTIN) 875-125 MG tablet Take 1 tablet by mouth 2 (two) times daily for 10 days. 20 tablet 0   predniSONE (DELTASONE) 10 MG tablet Take two tablets twice a day for 3 days, then on the 4th day take 2 tablets in the morning and on the 5th day take one tablet in the morning and stop 15 tablet 0   Current Facility-Administered Medications  Medication Dose Route Frequency Provider Last Rate Last Admin   tezepelumab-ekko (TEZSPIRE) 210 MG/1. syringe 210 mg  210 mg Subcutaneous Q28 days Ellamae Sia, DO   210 mg at  07/19/22 1408   tezepelumab-ekko (TEZSPIRE) 210 MG/1. syringe 210 mg  210 mg Subcutaneous Q21 days Ferol Luz, MD   210 mg at 01/03/23 1347   Allergies: Allergies  Allergen Reactions   Doxycycline Rash   Cyclobenzaprine    Hydrocortisone     Breaks out in hives with topical cream.    Sulfamethoxazole-Trimethoprim Swelling   Amoxicillin-Pot Clavulanate Nausea Only   Codeine Rash    Other reaction(s): RASH   I reviewed her past medical history, social history, family history, and environmental history and no significant changes have been reported from her previous visit.  ROS: All others negative except as noted per HPI.   Objective: BP 120/70   Pulse 77   Temp 98.1 F (36.7 C) (Temporal)   Resp 20   SpO2 98%  There is no height or weight on file to calculate BMI. General Appearance:  Alert, cooperative, no distress, appears stated age  Head:  Normocephalic, without obvious abnormality, atraumatic  Eyes:  Conjunctiva clear, EOM's intact  Nose: Nares normal,  erythematous nasal mucosa with yellow rhinnorhea, sinus tenderness, no visible anterior polyps, and septum midline, R TM- erythematous and bulging   Throat: Lips, tongue normal; teeth and gums normal, + cobblestoning  Neck: Supple, symmetrical  Lungs:   end-expiratory wheezing, Respirations unlabored, intermittent dry coughing  Heart:  regular rate and rhythm and no murmur, Appears well perfused  Extremities: No edema  Skin: Skin color, texture, turgor normal, no rashes or lesions on visualized portions of skin  Neurologic: No gross deficits   Previous notes and tests  were reviewed. The plan was reviewed with the patient/family, and all questions/concerned were addressed.  It was my pleasure to see Kristi Allen today and participate in her care. Please feel free to contact me with any questions or concerns.  Sincerely,  Ferol Luz, MD  Allergy & Immunology  [facility] Office: [phone]

## 2023-01-03 NOTE — Patient Instructions (Addendum)
CVID (common variable immunodeficiency) with bronchiectasis Continue at home infusions once a month with Privigen. Keep track of infections. Last ABX in 11/2022   Asthma-COPD overlap syndrome with sinus infection and right acute otitis media  Covid test negative   Start Augmentin 875 mg taking 1 tablet twice a day for 10 days Start prednisone 10 mg taking 2 tablets twice a day for 3 days, then on the 4th day take 2 tablets in the morning, and on the 5th day take one tablet and stop  Continue Vest therapy  Continue xopenox (levalbuterol) every 4-6 hours as needed  DECREASE smoking.  Daily controller medication(s): continue Symbicort 2 puffs twice a day with spacer and rinse mouth afterwards. Continue Spiriva 1.42mcg 2 puffs once a day. Continue Tezspire injections every 4 weeks.  During upper respiratory infections/flares:  Start budesonide (Pulmicort) 0.5mg  nebulizer twice a day for 1-2 weeks until your breathing symptoms return to baseline.  Call us for antibiotics  Pretreat with albuterol 2 puffs or albuterol nebulizer.  If you need to use your albuterol nebulizer machine back to back within 15-30 minutes with no relief then please go to the ER/urgent care for further evaluation.  May use levoalbuterol rescue inhaler 2 puffs or nebulizer every 4 to 6 hours as needed for shortness of breath, chest tightness, coughing, and wheezing. May use levoalbuterol rescue inhaler 2 puffs 5 to 15 minutes prior to strenuous physical activities. Monitor frequency of use.  Asthma control goals:  Full participation in all desired activities (may need albuterol before activity) Albuterol use two times or less a week on average (not counting use with activity) Cough interfering with sleep two times or less a month Oral steroids no more than once a year No hospitalizations    Seasonal and perennial allergic rhinitis- 2021 bloodwork positive to cat, dog, grass, trees, ragweed.  Continue with  Singulair (montelukast) 10mg  daily at night. Continue Rylatris 2 sprays per nostril daily  Nasal saline spray (i.e., Simply Saline) or nasal saline lavage (i.e., NeilMed) is recommended as needed and prior to medicated nasal sprays. Continue environmental control measures.   Gastroesophageal reflux disease Continue lifestyle and dietary modifications. Continue omeprazole 20mg  twice a day.   Follow up: 3 months   Thank you so much for letting me partake in your care today.  Don't hesitate to reach out if you have any additional concerns!  Ferol Luz, MD  [facility] [place] [place]

## 2023-01-24 ENCOUNTER — Ambulatory Visit (INDEPENDENT_AMBULATORY_CARE_PROVIDER_SITE_OTHER): Payer: 59

## 2023-01-24 DIAGNOSIS — J4550 Severe persistent asthma, uncomplicated: Secondary | ICD-10-CM

## 2023-01-29 ENCOUNTER — Other Ambulatory Visit: Payer: Self-pay

## 2023-01-29 ENCOUNTER — Telehealth: Payer: Self-pay | Admitting: Internal Medicine

## 2023-01-29 MED ORDER — BUDESONIDE-FORMOTEROL FUMARATE 160-4.5 MCG/ACT IN AERO: 2.0000 | INHALATION_SPRAY | Freq: Two times a day (BID) | RESPIRATORY_TRACT | 5 refills | Status: DC

## 2023-01-29 MED ORDER — BUDESONIDE 0.5 MG/2ML IN SUSP: RESPIRATORY_TRACT | 5 refills | Status: DC

## 2023-01-29 MED ORDER — BUDESONIDE-FORMOTEROL FUMARATE 80-4.5 MCG/ACT IN AERO: 2.0000 | INHALATION_SPRAY | Freq: Two times a day (BID) | RESPIRATORY_TRACT | 5 refills | Status: DC

## 2023-01-29 NOTE — Telephone Encounter (Signed)
Pt just needed a refill of symbicort to go to [facility] and pulmicort go to [facility] [address] sent in refills

## 2023-01-29 NOTE — Telephone Encounter (Signed)
Patient needing her symbicort 160 mcg and pulmicort 0.5 mg sent into [facility] [place] and I called [facility], spoke with courtney she will cancel the symbicort 160 mcg order. Patient is going to run out of her symbicort 160 mcg soon and she wasn't sure when she would receive her symbicort 160 mcg from [facility].

## 2023-01-29 NOTE — Telephone Encounter (Signed)
Pt called back and said it would take too long to get her symbicort from [facility] and wanted it to go to [facility] so changed the pharmacy and resent it in in

## 2023-01-29 NOTE — Telephone Encounter (Signed)
Patient is asking for a call from the nurse in reference to current medications she has questions please advise

## 2023-02-13 ENCOUNTER — Ambulatory Visit: Payer: 59

## 2023-02-13 DIAGNOSIS — J4550 Severe persistent asthma, uncomplicated: Secondary | ICD-10-CM | POA: Diagnosis not present

## 2023-03-01 ENCOUNTER — Other Ambulatory Visit: Payer: Self-pay | Admitting: Internal Medicine

## 2023-03-06 ENCOUNTER — Ambulatory Visit (INDEPENDENT_AMBULATORY_CARE_PROVIDER_SITE_OTHER): Payer: 59 | Admitting: *Deleted

## 2023-03-06 ENCOUNTER — Telehealth: Payer: Self-pay | Admitting: Internal Medicine

## 2023-03-06 DIAGNOSIS — J4550 Severe persistent asthma, uncomplicated: Secondary | ICD-10-CM | POA: Diagnosis not present

## 2023-03-06 NOTE — Telephone Encounter (Signed)
Patient was needing to know if she could get a Flu Shot Please advise

## 2023-03-27 ENCOUNTER — Ambulatory Visit: Payer: 59

## 2023-03-27 DIAGNOSIS — J4550 Severe persistent asthma, uncomplicated: Secondary | ICD-10-CM | POA: Diagnosis not present

## 2023-04-01 ENCOUNTER — Other Ambulatory Visit: Payer: Self-pay | Admitting: Internal Medicine

## 2023-04-02 ENCOUNTER — Other Ambulatory Visit: Payer: Self-pay

## 2023-04-02 MED ORDER — LORATADINE 10 MG PO TABS: 10.0000 mg | ORAL_TABLET | Freq: Every day | ORAL | 2 refills | Status: DC

## 2023-04-09 ENCOUNTER — Ambulatory Visit: Payer: 59 | Admitting: Internal Medicine

## 2023-04-17 ENCOUNTER — Ambulatory Visit: Payer: 59

## 2023-04-17 ENCOUNTER — Ambulatory Visit: Payer: 59 | Admitting: Internal Medicine

## 2023-04-17 VITALS — BP 118/60 | HR 63 | Temp 98.1°F | Resp 17 | Wt 140.4 lb

## 2023-04-17 DIAGNOSIS — J4550 Severe persistent asthma, uncomplicated: Secondary | ICD-10-CM | POA: Diagnosis not present

## 2023-04-17 DIAGNOSIS — J479 Bronchiectasis, uncomplicated: Secondary | ICD-10-CM

## 2023-04-17 DIAGNOSIS — Z720 Tobacco use: Secondary | ICD-10-CM

## 2023-04-17 DIAGNOSIS — D839 Common variable immunodeficiency, unspecified: Secondary | ICD-10-CM

## 2023-04-17 DIAGNOSIS — J302 Other seasonal allergic rhinitis: Secondary | ICD-10-CM

## 2023-04-17 DIAGNOSIS — J3089 Other allergic rhinitis: Secondary | ICD-10-CM

## 2023-04-17 DIAGNOSIS — K219 Gastro-esophageal reflux disease without esophagitis: Secondary | ICD-10-CM

## 2023-04-17 MED ORDER — BUDESONIDE-FORMOTEROL FUMARATE 160-4.5 MCG/ACT IN AERO: 2.0000 | INHALATION_SPRAY | Freq: Two times a day (BID) | RESPIRATORY_TRACT | 5 refills | Status: DC

## 2023-04-17 MED ORDER — AZITHROMYCIN 250 MG PO TABS: ORAL_TABLET | ORAL | 5 refills | Status: DC

## 2023-04-17 MED ORDER — BUDESONIDE 0.5 MG/2ML IN SUSP: RESPIRATORY_TRACT | 5 refills | Status: DC

## 2023-04-17 MED ORDER — RYALTRIS 665-25 MCG/ACT NA SUSP: 2.0000 | Freq: Two times a day (BID) | NASAL | 5 refills | Status: DC

## 2023-04-17 MED ORDER — EPINEPHRINE 0.3 MG/0.3ML IJ SOAJ: INTRAMUSCULAR | 3 refills | Status: DC

## 2023-04-17 MED ORDER — ALBUTEROL SULFATE HFA 108 (90 BASE) MCG/ACT IN AERS: 2.0000 | INHALATION_SPRAY | RESPIRATORY_TRACT | 11 refills | Status: DC | PRN

## 2023-04-17 MED ORDER — IPRATROPIUM BROMIDE 0.03 % NA SOLN: NASAL | 5 refills | Status: DC

## 2023-04-17 NOTE — Patient Instructions (Addendum)
CVID (common variable immunodeficiency) with bronchiectasis Continue at home infusions once a month with Privigen. Keep track of infections. Last ABX in 11/2022  Consider switching to hyperimmune IVIG  Start Azithromycin 250mg  on Monday, Wednesday and Friday  Get Flu and RSV vaccines   Asthma-COPD overlap syndrome  Continue Vest therapy  Continue xopenox (levalbuterol) every 4-6 hours as needed  DECREASE smoking.  Daily controller medication(s): continue Symbicort 2 puffs twice a day with spacer and rinse mouth afterwards. Continue Spiriva 1.7mcg 2 puffs once a day. Continue Tezspire injections every 4 weeks.  During upper respiratory infections/flares:  Start budesonide (Pulmicort) 0.5mg  nebulizer twice a day for 1-2 weeks until your breathing symptoms return to baseline.  Call us for antibiotics  Pretreat with albuterol 2 puffs or albuterol nebulizer.  If you need to use your albuterol nebulizer machine back to back within 15-30 minutes with no relief then please go to the ER/urgent care for further evaluation.  May use levoalbuterol rescue inhaler 2 puffs or nebulizer every 4 to 6 hours as needed for shortness of breath, chest tightness, coughing, and wheezing. May use levoalbuterol rescue inhaler 2 puffs 5 to 15 minutes prior to strenuous physical activities. Monitor frequency of use.  Asthma control goals:  Full participation in all desired activities (may need albuterol before activity) Albuterol use two times or less a week on average (not counting use with activity) Cough interfering with sleep two times or less a month Oral steroids no more than once a year No hospitalizations    Seasonal and perennial allergic rhinitis- 2021 bloodwork positive to cat, dog, grass, trees, ragweed.  Continue with Singulair (montelukast) 10mg  daily at night. Continue flonase 1 spray per nostril twice daily  Continue Atrovent 1 spray per nostril up 3 times a day  Nasal saline spray (i.e.,  Simply Saline) or nasal saline lavage (i.e., NeilMed) is recommended as needed and prior to medicated nasal sprays. Continue environmental control measures.   Gastroesophageal reflux disease Continue lifestyle and dietary modifications. Continue omeprazole 20mg  twice a day.   Follow up: 3 months   Thank you so much for letting me partake in your care today.  Don't hesitate to reach out if you have any additional concerns!  Ferol Luz, MD  [facility] [place] [place]

## 2023-04-17 NOTE — Progress Notes (Signed)
Follow Up Note  RE: Kristi Allen MRN: [id number] DOB: 1956-03-23 Date of Office Visit: 04/17/2023  Referring provider: List, Nash Shearer, FNP Primary care provider: List, Nash Shearer, FNP  Chief Complaint: Follow-up (Pt states she's doing much better, she was sick awhile back and was prescribed antibiotics and steroids which finish and  is now starting to feel better. Runny nose, congestion, coughing and headache.) and Asthma  History of Present Illness: I had the pleasure of seeing Kristi Allen for a follow up visit at the [facility] on 04/17/2023. She is a 67 y.o. female, who is being followed for CVID, persistent asthma, allergic rhinitis, GERD . Her previous allergy office visit was on 01/03/23/24 with Dr. Marlynn Perking. Today is a  acute visit  .  History obtained from patient, chart review.    She is on tezpire, symbicort and spirivia.  She is on privigen for IVIG.     History:   Asthma/ copd overlap syndrome Past history - Patient used to follow with pulmonology as well.  Normal alpha-1 level. Eosinophils 100, IgE 458.  On Tezspire every 4 weeks  -On vest therapy at last visit.  Manual CPT was considered however due to advanced disease she would not tolerate positioning.   -Chest CT 2023: IMPRESSION: 1. Mild central bronchiectasis and bronchial wall thickening in keeping with airway inflammation. Mucoid impaction within several segmental bronchi of the left upper lobe. 2. Mild multi-vessel coronary artery calcification. 3. Sparse ground-glass opacity within the right lower lobe may be infectious or inflammatory in nature. 4.  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 Transitions to Primigen which she tolerates better.  -Last antibiotics in July 24 -Prefers IVIG over  SCIG -CBC//CMP/IgG: normal at goal 11/09/22   Seasonal and perennial allergic rhinitis Past history - 2021 bloodwork positive to cat, dog, grass, trees, ragweed.  Currently on Singulair 10 mg daily, Flonase 1 spray per nostril twice a day, Atrovent 1 to 2 sprays per nostril daily.     GERD  - on omeprazole 20mg  twice daily    Today She reports:  Had her physical and had wheezing and coughing.  Was prescribed azithromycin  and prednisone.   Using albuterol HFA and Albuterol nebs every twice day.   She has been compliant with primigen and tezspire, Feels like symptoms return from week 3-4 before tezspire injection.  Been compliant with symbicort 160mg  2 puffs twice daily, Spiriva 2 puffs daily,  montelukast 10mg  daily, flonase and atrovent (feels like atrovent helps the most for sinus drainage) Last ABX in July 2024 - did feel better afterwards until this past week.  GERD controlled on omeprazole 20mg  twice daily.   Assessment and Plan: [name] is a 67 y.o. female with: CVID (common variable immunodeficiency) (HCC)  Severe persistent asthma without complication - Plan: Spirometry with Graph  Bronchiectasis without complication (HCC)  Seasonal and perennial allergic rhinitis  Gastroesophageal reflux disease without esophagitis  Tobacco use   Plan: Patient Instructions  CVID (common variable immunodeficiency) with bronchiectasis Continue at home infusions once a month with Privigen. Keep track of infections. Last ABX in 11/2022  Consider switching to hyperimmune IVIG  Start Azithromycin 250mg  on Monday, Wednesday and Friday  Get Flu and RSV vaccines   Asthma-COPD overlap syndrome  Continue Vest therapy  Continue xopenox (levalbuterol) every 4-6 hours  as needed  DECREASE smoking.  Daily controller medication(s): continue Symbicort 2 puffs twice a day with spacer and rinse mouth afterwards. Continue Spiriva 1.54mcg 2 puffs once a day. Continue Tezspire injections every 4  weeks.  During upper respiratory infections/flares:  Start budesonide (Pulmicort) 0.5mg  nebulizer twice a day for 1-2 weeks until your breathing symptoms return to baseline.  Call us for antibiotics  Pretreat with albuterol 2 puffs or albuterol nebulizer.  If you need to use your albuterol nebulizer machine back to back within 15-30 minutes with no relief then please go to the ER/urgent care for further evaluation.  May use levoalbuterol rescue inhaler 2 puffs or nebulizer every 4 to 6 hours as needed for shortness of breath, chest tightness, coughing, and wheezing. May use levoalbuterol rescue inhaler 2 puffs 5 to 15 minutes prior to strenuous physical activities. Monitor frequency of use.  Asthma control goals:  Full participation in all desired activities (may need albuterol before activity) Albuterol use two times or less a week on average (not counting use with activity) Cough interfering with sleep two times or less a month Oral steroids no more than once a year No hospitalizations    Seasonal and perennial allergic rhinitis- 2021 bloodwork positive to cat, dog, grass, trees, ragweed.  Continue with Singulair (montelukast) 10mg  daily at night. Continue flonase 1 spray per nostril twice daily  Continue Atrovent 1 spray per nostril up 3 times a day  Nasal saline spray (i.e., Simply Saline) or nasal saline lavage (i.e., NeilMed) is recommended as needed and prior to medicated nasal sprays. Continue environmental control measures.   Gastroesophageal reflux disease Continue lifestyle and dietary modifications. Continue omeprazole 20mg  twice a day.   Follow up: 3 months   Thank you so much for letting me partake in your care today.  Don't hesitate to reach out if you have any additional concerns!  Ferol Luz, MD  [facility] [place] [place]   Meds ordered this encounter  Medications   budesonide (PULMICORT) 0.5 MG/2ML nebulizer solution    Sig: USE ONE VIAL IN  NEBULIZER IN THE MORNING AND AT BEDTIME ONLY AS NEEDED    Dispense:  120 mL    Refill:  5    File on part B   budesonide-formoterol (SYMBICORT) 160-4.5 MCG/ACT inhaler    Sig: Inhale 2 puffs into the lungs 2 (two) times daily.    Dispense:  1 each    Refill:  5   EPINEPHrine (EPIPEN 2-PAK) 0.3 mg/0.3 mL IJ SOAJ injection    Sig: Use as needed for severe allergic reactions.    Dispense:  2 each    Refill:  3    Dispense Teva or Mylan brand only   ipratropium (ATROVENT) 0.03 % nasal spray    Sig: Take 1-2 spray in each nostril twice a daily as needed for nasal drainage.    Dispense:  30 mL    Refill:  5    This prescription was filled on 07/25/2021. Any refills authorized will be placed on file.   Olopatadine-Mometasone (RYALTRIS) [id number] MCG/ACT SUSP    Sig: Place 2 sprays into the nose in the morning and at bedtime.    Dispense:  29 g    Refill:  5   azithromycin (ZITHROMAX) 250 MG tablet    Sig: Take 250mg  on Monday, Wednesday, Friday    Dispense:  30 each    Refill:  5   albuterol (VENTOLIN HFA) 108 (90 Base) MCG/ACT inhaler  Sig: Inhale 2 puffs into the lungs every 4 (four) hours as needed for wheezing or shortness of breath.    Dispense:  17 g    Refill:  11    Lab Orders  No laboratory test(s) ordered today   Diagnostics: Spirometry:  Tracings reviewed. Her effort: It was hard to get consistent efforts and there is a question as to whether this reflects a maximal maneuver. FVC: 1.63 L FEV1: 1.33 L, 62% predicted FEV1/FVC ratio: 82% Interpretation: Spirometry consistent with mixed obstructive and restrictive disease.  Please see scanned spirometry results for details.   Medication List:  Current Outpatient Medications  Medication Sig Dispense Refill   albuterol (PROVENTIL) (2.5 MG/3ML) 0.083% nebulizer solution USE ONE VIAL IN NEBULIZER EVERY 6 HOURS AS NEEDED FOR WHEEZING OR SHORTNESS OF BREATH 75 mL 1   albuterol (VENTOLIN HFA) 108 (90 Base) MCG/ACT inhaler  Inhale 1-2 puffs into the lungs every 4 (four) hours as needed for wheezing or shortness of breath. INHALE TWO PUFFS INTO THE LUNGS EVERY 4 HOURS AS NEEDED FOR WHEEZING OR SHORTNESS OF BREATH 18 g 1   albuterol (VENTOLIN HFA) 108 (90 Base) MCG/ACT inhaler INHALE 2 PUFFS INTO THE LUNGS EVERY 4 HOURS AS NEEDED FOR WHEEZING OR SHORTNESS OF BREATH 8.5 g 3   ALLERGY RELIEF 10 MG tablet TAKE ONE TABLET BY MOUTH DAILY 30 tablet 0   amLODipine (NORVASC) 10 MG tablet Take 10 mg by mouth daily.     atorvastatin (LIPITOR) 10 MG tablet Take 10 mg by mouth daily.     azithromycin (ZITHROMAX) 250 MG tablet Take 250mg  on Monday, Wednesday, Friday 30 each 5   buPROPion (WELLBUTRIN XL) 150 MG 24 hr tablet Take 1 tablet by mouth daily.     busPIRone (BUSPAR) 10 MG tablet Take by mouth.     Calcium Carb-Cholecalciferol (CALCIUM CARBONATE-VITAMIN D3) 600-400 MG-UNIT TABS TAKE ONE TABLET BY MOUTH 2 TIMES A DAY     Carbinoxamine Maleate 4 MG TABS Take 1 tablet (4 mg total) by mouth in the morning and at bedtime. 60 tablet 3   cetirizine (ZYRTEC) 10 MG tablet Take one tablet once daily for runny nose or itchy eyes 30 tablet 5   Cholecalciferol (VITAMIN D3) 50 MCG (2000 UT) TABS Take by mouth.     fluconazole (DIFLUCAN) 150 MG tablet Take one tablet onset of itching 1 tablet 0   fluticasone (FLONASE) 50 MCG/ACT nasal spray USE ONE SPRAY IN BOTH NOSTRILS EVERY MORNING AND AT BEDTIME FOR DRAINAGE 16 g 5   hydrochlorothiazide (HYDRODIURIL) 25 MG tablet Take 25 mg by mouth daily.     Immune Globulin, Human, (PRIVIGEN IV) Inject 30 mg into the vein every 30 (thirty) days.     ipratropium-albuterol (DUONEB) 0.5-2.5 (3) MG/3ML SOLN Take 3 mLs by nebulization every 6 (six) hours as needed. 360 mL 5   levalbuterol (XOPENEX HFA) 45 MCG/ACT inhaler INHALE 2 PUFFS EVERY 4 HOURS AS NEEDED FOR WHEEZING AND SHORTNESS OF BREATH 15 g 1   levocetirizine (XYZAL) 5 MG tablet TAKE ONE TABLET BY MOUTH DAILY AS NEEDED FOR ALLERGIES 30 tablet 5    loratadine (ALLERGY RELIEF) 10 MG tablet Take 1 tablet (10 mg total) by mouth daily. 30 tablet 2   losartan (COZAAR) 50 MG tablet Take 1 tablet by mouth daily.     montelukast (SINGULAIR) 10 MG tablet Take 1 tablet (10 mg total) by mouth at bedtime. 30 tablet 11   Olopatadine HCl 0.2 % SOLN Place one drop  in each eye once a day as needed for itchy eyes 2.5 mL 5   omeprazole (PRILOSEC) 20 MG capsule Take 20 mg by mouth 2 (two) times daily.     Respiratory Therapy Supplies (NEBULIZER/TUBING/MOUTHPIECE) KIT Use with nebulizer 1 kit 1   SPIRIVA RESPIMAT 1.25 MCG/ACT AERS INHALE TWO PUFFS INTO THE LUNGS EVERY DAY 4 g 5   tezepelumab-ekko (TEZSPIRE) 210 MG/1. syringe INJECT 210MG  SUBCUTANEOUSLY  EVERY 4 WEEKS TO BE ADMINISTERED BY A HEALTHCARE PROVIDER IN A  HEALTHCARE SETTING 1.91 mL 11   tezepelumab-ekko (TEZSPIRE) 210 MG/1. syringe Inject 1.91 mLs (210 mg total) into the skin every 28 (twenty-eight) days. 1.91 mL 11   tezepelumab-ekko (TEZSPIRE) 210 MG/1. syringe INJECT 210MG  SUBCUTANEOUSLY  EVERY 4 WEEKS 1.91 mL 11   traZODone (DESYREL) 50 MG tablet Take 50 mg by mouth at bedtime.     vitamin C (ASCORBIC ACID) 500 MG tablet Take 500 mg by mouth daily.     Zinc 50 MG CAPS Take by mouth.     albuterol (VENTOLIN HFA) 108 (90 Base) MCG/ACT inhaler Inhale 2 puffs into the lungs every 4 (four) hours as needed for wheezing or shortness of breath. 17 g 11   budesonide (PULMICORT) 0.5 MG/2ML nebulizer solution USE ONE VIAL IN NEBULIZER IN THE MORNING AND AT BEDTIME ONLY AS NEEDED 120 mL 5   budesonide-formoterol (SYMBICORT) 160-4.5 MCG/ACT inhaler Inhale 2 puffs into the lungs 2 (two) times daily. 1 each 5   EPINEPHrine (EPIPEN 2-PAK) 0.3 mg/0.3 mL IJ SOAJ injection Use as needed for severe allergic reactions. 2 each 3   ipratropium (ATROVENT) 0.03 % nasal spray Take 1-2 spray in each nostril twice a daily as needed for nasal drainage. 30 mL 5   Olopatadine-Mometasone (RYALTRIS) 665-25 MCG/ACT  SUSP Place 2 sprays into the nose in the morning and at bedtime. 29 g 5   Current Facility-Administered Medications  Medication Dose Route Frequency Provider Last Rate Last Admin   tezepelumab-ekko (TEZSPIRE) 210 MG/1. syringe 210 mg  210 mg Subcutaneous Q28 days Ellamae Sia, DO   210 mg at 04/17/23 1433   tezepelumab-ekko (TEZSPIRE) 210 MG/1. syringe 210 mg  210 mg Subcutaneous Q21 days Ferol Luz, MD   210 mg at 03/27/23 1350   Allergies: Allergies  Allergen Reactions   Doxycycline Rash   Cyclobenzaprine    Hydrocortisone     Breaks out in hives with topical cream.    Sulfamethoxazole-Trimethoprim Swelling   Amoxicillin-Pot Clavulanate Nausea Only   Codeine Rash    Other reaction(s): RASH   I reviewed her past medical history, social history, family history, and environmental history and no significant changes have been reported from her previous visit.  ROS: All others negative except as noted per HPI.   Objective: BP 118/60 (BP Location: Right Arm, Patient Position: Sitting, Cuff Size: Normal)   Pulse 63   Temp 98.1 F (36.7 C) (Temporal)   Resp 17   Wt 140 lb 6.4 oz (63.7 kg)   SpO2 97%   BMI 26.53 kg/m  Body mass index is 26.53 kg/m. General Appearance:  Alert, cooperative, no distress, appears stated age  Head:  Normocephalic, without obvious abnormality, atraumatic  Eyes:  Conjunctiva clear, EOM's intact  Nose: Nares normal,  erythematous nasal mucosa with clear rhinorrhea, no visible anterior polyps, and septum midline,   Throat: Lips, tongue normal; teeth and gums normal, + cobblestoning  Neck: Supple, symmetrical  Lungs:   end-expiratory wheezing, Respirations unlabored, intermittent dry coughing (baseline  pulmonary exam)   Heart:  regular rate and rhythm and no murmur, Appears well perfused  Extremities: No edema  Skin: Skin color, texture, turgor normal, no rashes or lesions on visualized portions of skin  Neurologic: No gross deficits    Previous notes and tests were reviewed. The plan was reviewed with the patient/family, and all questions/concerned were addressed.  It was my pleasure to see Lil today and participate in her care. Please feel free to contact me with any questions or concerns.  Sincerely,  Ferol Luz, MD  Allergy & Immunology  [facility] Office: [phone]

## 2023-05-03 ENCOUNTER — Other Ambulatory Visit: Payer: Self-pay | Admitting: Internal Medicine

## 2023-05-08 ENCOUNTER — Ambulatory Visit: Payer: 59

## 2023-05-15 ENCOUNTER — Ambulatory Visit: Payer: 59

## 2023-05-21 ENCOUNTER — Ambulatory Visit: Payer: 59

## 2023-05-21 DIAGNOSIS — J4550 Severe persistent asthma, uncomplicated: Secondary | ICD-10-CM

## 2023-06-07 ENCOUNTER — Other Ambulatory Visit: Payer: Self-pay | Admitting: Internal Medicine

## 2023-06-10 ENCOUNTER — Other Ambulatory Visit: Payer: Self-pay | Admitting: Internal Medicine

## 2023-06-12 ENCOUNTER — Ambulatory Visit: Payer: 59

## 2023-06-12 DIAGNOSIS — J4550 Severe persistent asthma, uncomplicated: Secondary | ICD-10-CM

## 2023-07-09 ENCOUNTER — Other Ambulatory Visit: Payer: Self-pay | Admitting: Internal Medicine

## 2023-07-09 ENCOUNTER — Telehealth: Payer: Self-pay | Admitting: Internal Medicine

## 2023-07-09 MED ORDER — AMOXICILLIN-POT CLAVULANATE 875-125 MG PO TABS: 1.0000 | ORAL_TABLET | Freq: Two times a day (BID) | ORAL | 0 refills | Status: AC

## 2023-07-09 MED ORDER — AMOXICILLIN-POT CLAVULANATE 875-125 MG PO TABS: 1.0000 | ORAL_TABLET | Freq: Two times a day (BID) | ORAL | 0 refills | Status: DC

## 2023-07-09 NOTE — Telephone Encounter (Signed)
 Pt is having a itchy throat and skin, stuffy nose, ears stopped up, allergies are bad, she is staying home as much as she can, coughing due to drainage. She was wondering if the azithromycin could be causing her coughing?

## 2023-07-09 NOTE — Telephone Encounter (Signed)
 PT request a call back about some symptoms she is having.

## 2023-07-09 NOTE — Telephone Encounter (Signed)
 Tried calling pt about antibiotic but phone just kept ringing with no answer will try again alter

## 2023-07-09 NOTE — Telephone Encounter (Signed)
 Unlikey for azithromycin to cause coughing, but she should get antibiotics given her history with CVID.  Augmentin 875/125mg  twice daily for 14 days have been sent in.

## 2023-07-09 NOTE — Telephone Encounter (Signed)
 Patient called did make patient aware Augmentin was called in for her but patient is asking for prednisone for her allergies  and something for yeast infections if she is going to be on antibiotics also asking to send all these to [facility] on [address] and [address] also asking for nurse to call her to let her know they have been sent

## 2023-07-09 NOTE — Addendum Note (Signed)
 Addended by: Berna Bue on: 07/09/2023 04:17 PM   Modules accepted: Orders

## 2023-07-09 NOTE — Telephone Encounter (Signed)
 Can we send in diflucan for possible yeast infection with antibiotics and can we send in prednisone for pt?

## 2023-07-10 ENCOUNTER — Other Ambulatory Visit: Payer: Self-pay | Admitting: Internal Medicine

## 2023-07-10 ENCOUNTER — Other Ambulatory Visit: Payer: Self-pay

## 2023-07-10 MED ORDER — FLUCONAZOLE 150 MG PO TABS: ORAL_TABLET | ORAL | 0 refills | Status: DC

## 2023-07-10 MED ORDER — PREDNISONE 10 MG PO TABS: ORAL_TABLET | ORAL | 0 refills | Status: DC

## 2023-07-10 NOTE — Telephone Encounter (Signed)
 Diflucan and prednisone have been sent in for pt can you please call and let pt know this thank you

## 2023-07-10 NOTE — Addendum Note (Signed)
 Addended by: Berna Bue on: 07/10/2023 09:29 AM   Modules accepted: Orders

## 2023-07-10 NOTE — Telephone Encounter (Signed)
 Ill send in diflucan and prednisone.

## 2023-07-12 ENCOUNTER — Other Ambulatory Visit: Payer: Self-pay | Admitting: Internal Medicine

## 2023-07-26 ENCOUNTER — Telehealth: Payer: Self-pay

## 2023-07-26 NOTE — Telephone Encounter (Signed)
 Fyi: [place] from [facility] they have made several attempts to contact her to fill tezspire 210mg /1.77ml her order #[id number] call number if questions [phone]

## 2023-07-30 ENCOUNTER — Telehealth: Payer: Self-pay | Admitting: Internal Medicine

## 2023-07-30 NOTE — Telephone Encounter (Signed)
 Called and spoke to [place]. I gave her message from Dr. Marlynn Perking to go to Urgent care or PCP to get flu test and CXR for sxs.  She states, she's been sick. She is having a lot of coughing. Has also  been around a family member that had the flu. Patient verbalized understanding. Will [place] back when she knows the results to reschedule her Tezspire.

## 2023-07-30 NOTE — Telephone Encounter (Signed)
 Pt called and wanted a call back and stated she is getting sick from medication and not able to get her shot tomorrow and requested a call back.

## 2023-07-31 ENCOUNTER — Ambulatory Visit: Payer: 59

## 2023-08-02 ENCOUNTER — Other Ambulatory Visit: Payer: Self-pay | Admitting: Internal Medicine

## 2023-08-07 ENCOUNTER — Ambulatory Visit

## 2023-08-07 DIAGNOSIS — J4550 Severe persistent asthma, uncomplicated: Secondary | ICD-10-CM | POA: Diagnosis not present

## 2023-08-10 ENCOUNTER — Other Ambulatory Visit: Payer: Self-pay | Admitting: Internal Medicine

## 2023-08-23 ENCOUNTER — Other Ambulatory Visit: Payer: Self-pay | Admitting: Internal Medicine

## 2023-08-27 ENCOUNTER — Other Ambulatory Visit: Payer: Self-pay | Admitting: Internal Medicine

## 2023-08-28 ENCOUNTER — Ambulatory Visit

## 2023-08-28 DIAGNOSIS — J4550 Severe persistent asthma, uncomplicated: Secondary | ICD-10-CM

## 2023-09-18 ENCOUNTER — Ambulatory Visit

## 2023-09-18 DIAGNOSIS — J4550 Severe persistent asthma, uncomplicated: Secondary | ICD-10-CM | POA: Diagnosis not present

## 2023-09-21 ENCOUNTER — Other Ambulatory Visit: Payer: Self-pay | Admitting: Internal Medicine

## 2023-10-09 ENCOUNTER — Ambulatory Visit

## 2023-10-09 DIAGNOSIS — J4550 Severe persistent asthma, uncomplicated: Secondary | ICD-10-CM | POA: Diagnosis not present

## 2023-10-10 ENCOUNTER — Other Ambulatory Visit: Payer: Self-pay | Admitting: Internal Medicine

## 2023-10-10 NOTE — Telephone Encounter (Signed)
 Refill for Spiriva  denied; refill too soon-per pharmacy refill was filled on 09/24/23. Patient is also due for a 6 month follow up.

## 2023-10-19 ENCOUNTER — Other Ambulatory Visit: Payer: Self-pay | Admitting: Allergy

## 2023-10-19 NOTE — Telephone Encounter (Signed)
 Raeanne Bull from [facility] called [phone] regarding patient of Dr Jolayne Natter fyi pcp rx's advair discus filled 06/26/2023 and wixela generic  filled 09/24/2023. We already have her on spiriva  and symbicort  patient states she is using both maintenance inhalers. There is a label warning INCREASE QTC AND  ARRHYTHMIA WITH ADVAIR AND SYMBICORT .

## 2023-10-25 NOTE — Telephone Encounter (Signed)
 She should only be on symbicort  and spiriva .  No advair or wixela.  Not sure why PCP renewed these medications.

## 2023-10-30 ENCOUNTER — Ambulatory Visit (INDEPENDENT_AMBULATORY_CARE_PROVIDER_SITE_OTHER)

## 2023-10-30 DIAGNOSIS — J4550 Severe persistent asthma, uncomplicated: Secondary | ICD-10-CM | POA: Diagnosis not present

## 2023-11-20 ENCOUNTER — Ambulatory Visit

## 2023-11-20 DIAGNOSIS — J4550 Severe persistent asthma, uncomplicated: Secondary | ICD-10-CM

## 2023-11-21 ENCOUNTER — Other Ambulatory Visit: Payer: Self-pay | Admitting: Internal Medicine

## 2023-11-28 ENCOUNTER — Other Ambulatory Visit: Payer: Self-pay | Admitting: Internal Medicine

## 2023-12-04 ENCOUNTER — Ambulatory Visit

## 2023-12-05 ENCOUNTER — Ambulatory Visit

## 2023-12-07 ENCOUNTER — Ambulatory Visit (INDEPENDENT_AMBULATORY_CARE_PROVIDER_SITE_OTHER)

## 2023-12-07 DIAGNOSIS — J4550 Severe persistent asthma, uncomplicated: Secondary | ICD-10-CM | POA: Diagnosis not present

## 2023-12-19 ENCOUNTER — Telehealth: Payer: Self-pay | Admitting: *Deleted

## 2023-12-19 NOTE — Telephone Encounter (Signed)
 Per [facility] need updated notes and IgG to get approval for her Privigen. L/m for patient to make appt for updated documentation

## 2023-12-24 ENCOUNTER — Other Ambulatory Visit: Payer: Self-pay | Admitting: Internal Medicine

## 2023-12-28 ENCOUNTER — Other Ambulatory Visit: Payer: Self-pay | Admitting: Internal Medicine

## 2024-01-01 ENCOUNTER — Ambulatory Visit (INDEPENDENT_AMBULATORY_CARE_PROVIDER_SITE_OTHER)

## 2024-01-01 DIAGNOSIS — J4550 Severe persistent asthma, uncomplicated: Secondary | ICD-10-CM

## 2024-01-03 ENCOUNTER — Encounter: Payer: Self-pay | Admitting: Internal Medicine

## 2024-01-03 ENCOUNTER — Ambulatory Visit (INDEPENDENT_AMBULATORY_CARE_PROVIDER_SITE_OTHER): Admitting: Internal Medicine

## 2024-01-03 VITALS — BP 134/60 | HR 70 | Temp 97.7°F | Resp 18 | Wt 140.1 lb

## 2024-01-03 DIAGNOSIS — J479 Bronchiectasis, uncomplicated: Secondary | ICD-10-CM | POA: Diagnosis not present

## 2024-01-03 DIAGNOSIS — J4550 Severe persistent asthma, uncomplicated: Secondary | ICD-10-CM | POA: Diagnosis not present

## 2024-01-03 DIAGNOSIS — D839 Common variable immunodeficiency, unspecified: Secondary | ICD-10-CM

## 2024-01-03 DIAGNOSIS — Z720 Tobacco use: Secondary | ICD-10-CM

## 2024-01-03 DIAGNOSIS — J3089 Other allergic rhinitis: Secondary | ICD-10-CM

## 2024-01-03 DIAGNOSIS — J0181 Other acute recurrent sinusitis: Secondary | ICD-10-CM | POA: Diagnosis not present

## 2024-01-03 DIAGNOSIS — J302 Other seasonal allergic rhinitis: Secondary | ICD-10-CM

## 2024-01-03 DIAGNOSIS — K219 Gastro-esophageal reflux disease without esophagitis: Secondary | ICD-10-CM

## 2024-01-03 MED ORDER — AMOXICILLIN-POT CLAVULANATE 875-125 MG PO TABS: 1.0000 | ORAL_TABLET | Freq: Two times a day (BID) | ORAL | 0 refills | Status: AC

## 2024-01-03 MED ORDER — FLUCONAZOLE 150 MG PO TABS: ORAL_TABLET | ORAL | 0 refills | Status: DC

## 2024-01-03 MED ORDER — LEVALBUTEROL TARTRATE 45 MCG/ACT IN AERO: 2.0000 | INHALATION_SPRAY | Freq: Four times a day (QID) | RESPIRATORY_TRACT | 1 refills | Status: DC | PRN

## 2024-01-03 MED ORDER — RYALTRIS 665-25 MCG/ACT NA SUSP: 2.0000 | Freq: Two times a day (BID) | NASAL | 5 refills | Status: AC

## 2024-01-03 MED ORDER — EPINEPHRINE 0.3 MG/0.3ML IJ SOAJ: INTRAMUSCULAR | 3 refills | Status: AC

## 2024-01-03 MED ORDER — AZITHROMYCIN 250 MG PO TABS: ORAL_TABLET | ORAL | 5 refills | Status: AC

## 2024-01-03 MED ORDER — AMOXICILLIN-POT CLAVULANATE 875-125 MG PO TABS: 1.0000 | ORAL_TABLET | Freq: Two times a day (BID) | ORAL | 0 refills | Status: DC

## 2024-01-03 NOTE — Patient Instructions (Addendum)
 CVID (common variable immunodeficiency) with bronchiectasis and acute sinus infection  Start augmentin  875/125 mg twice daily for 14 days  Diflucan  150mg  prescribed as needed for antibiotic associated yeast infections  Continue at home infusions once a month with Privigen. Surveillence labs done today  Keep track of infections. Last ABX in 12/2023 - azithromycin  pack prescribed by  PCP  Consider switching to hyperimmune IVIG  Restart  Azithromycin  250mg  on Monday, Wednesday and Friday  Start this after you complete the augmentin   Get Flu and RSV vaccines   Asthma-COPD overlap syndrome  Continue Vest therapy  Continue xopenox (levalbuterol ) every 4-6 hours as needed  DECREASE smoking.  Daily controller medication(s): continue Symbicort  160mcg 2 puffs twice a day with spacer and rinse mouth afterwards. Continue Spiriva  1.25mcg 2 puffs once a day. Continue Tezspire  injections every 4 weeks.  During upper respiratory infections/flares:  Start budesonide  (Pulmicort ) 0.5mg  nebulizer twice a day for 1-2 weeks until your breathing symptoms return to baseline.  Call us  for antibiotics  Pretreat with albuterol  2 puffs or albuterol  nebulizer.  If you need to use your albuterol  nebulizer machine back to back within 15-30 minutes with no relief then please go to the ER/urgent care for further evaluation.  May use levoalbuterol rescue inhaler 2 puffs or nebulizer every 4 to 6 hours as needed for shortness of breath, chest tightness, coughing, and wheezing. May use levoalbuterol rescue inhaler 2 puffs 5 to 15 minutes prior to strenuous physical activities. Monitor frequency of use.  Asthma control goals:  Full participation in all desired activities (may need albuterol  before activity) Albuterol  use two times or less a week on average (not counting use with activity) Cough interfering with sleep two times or less a month Oral steroids no more than once a year No hospitalizations    Seasonal and  perennial allergic rhinitis- 2021 bloodwork positive to cat, dog, grass, trees, ragweed.  Continue with Singulair  (montelukast ) 10mg  daily at night. Continue flonase  1 spray per nostril twice daily  Continue Atrovent  1 spray per nostril up 3 times a day  Nasal saline spray (i.e., Simply Saline) or nasal saline lavage (i.e., NeilMed) is recommended as needed and prior to medicated nasal sprays. Continue environmental control measures.   Gastroesophageal reflux disease Continue lifestyle and dietary modifications. Continue omeprazole 20mg  twice a day.   Follow up: 3 months   Thank you so much for letting me partake in your care today.  Don't hesitate to reach out if you have any additional concerns!  Hargis Springer, MD  [facility] [place] [place]

## 2024-01-03 NOTE — Progress Notes (Signed)
 Follow Up Note  RE: Kristi Allen MRN: [id number] DOB: [birth year] Date of Office Visit: 01/03/2024  Referring provider: List, Sarrin, FNP Primary care provider: List, Sarrin, FNP  Chief Complaint: Follow-up and Medication Management (Reapproval for Tezspire  )  History of Present Illness: I had the pleasure of seeing Kristi Allen for a follow up visit at the [facility] on 01/03/2024. She is a 68 y.o. female, who is being followed for CVID, persistent asthma, allergic rhinitis, GERD . Her previous allergy office visit was on 05/01/23 with Dr. Lorin. Today is a regular follow up visit.  History obtained from patient, chart review.      History:   Asthma/ copd overlap syndrome Past history - Patient used to follow with pulmonology as well.  Normal alpha-1 level. Eosinophils 100, IgE 458.  On Tezspire  every 4 weeks  -On vest therapy at last visit.  Manual CPT was considered however due to advanced disease she would not tolerate positioning.   -Chest CT 2023: IMPRESSION: 1. Mild central bronchiectasis and bronchial wall thickening in keeping with airway inflammation. Mucoid impaction within several segmental bronchi of the left upper lobe. 2. Mild multi-vessel coronary artery calcification. 3. Sparse ground-glass opacity within the right lower lobe may be infectious or inflammatory in nature. 4.  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 Transitions to Primigen which she tolerates better.  - On Azithromycin  MWF  -Prefers IVIG over SCIG -CBC//CMP/IgG: normal at goal 11/09/22   Seasonal and perennial allergic rhinitis Past history - 2021 bloodwork positive to cat, dog, grass, trees, ragweed.  Currently on Singulair  10 mg daily, Flonase  1 spray per  nostril twice a day, Atrovent  1 to 2 sprays per nostril daily.     GERD  - on omeprazole 20mg  twice daily    Today She reports:  Discussed the use of AI scribe software for clinical note transcription with the patient, who gave verbal consent to proceed.  History of Present Illness Kristi Allen is a 68 year old female with asthma and CVID who presents with worsening respiratory symptoms and medication management.  Respiratory symptoms - Worsening asthma and allergy symptoms, including wheezing, severe coughing, nasal congestion, sinus drainage, watery and itchy eyes - Currently taking Claritin , Singulair , Flonase  nasal spray, and Atrovent  nasal spray (up to two times daily) for upper airway symptoms  - PCP prescribed Z- pack without good effect.  - No fevers, but sinus pain and tenderness   Immunodeficiency management - CVID with monthly Privigen infusions at home administered by a nurse - Difficulty with venous access during infusions, requiring access in different locations - Concern for potential infections related to infusions  Antibiotic-associated adverse effects - Concern for potential yeast infections from antibiotics due to prior history of this complication, request empiric diflucan     Psychological symptoms - Panic attacks related to health issues - Increased stress attributed to health concerns - Seeking medication for anxiety but has faced challenges obtaining it during recent medical visits  Tobacco use - Smokes one pack of cigarettes per day, attempting to reduce to half a pack - Increased smoking attributed to stress  Laboratory monitoring - Needs updated IGs    Assessment and Plan: Kristi Allen is a 68 y.o. female with: Other acute recurrent sinusitis  Severe persistent asthma without complication -  Plan: Spirometry with Graph  CVID (common variable immunodeficiency) (HCC) - Plan: IgG, IgA, IgM, CMP14+EGFR  Bronchiectasis without complication  (HCC)  Seasonal and perennial allergic rhinitis  Gastroesophageal reflux disease without esophagitis  Tobacco use   Plan: Patient Instructions  CVID (common variable immunodeficiency) with bronchiectasis and acute sinus infection  Start augmentin  875/125 mg twice daily for 14 days  Diflucan  150mg  prescribed as needed for antibiotic associated yeast infections  Continue at home infusions once a month with Privigen. Surveillence labs done today  Keep track of infections. Last ABX in 12/2023 - azithromycin  pack prescribed by  PCP  Consider switching to hyperimmune IVIG  Restart  Azithromycin  250mg  on Monday, Wednesday and Friday  Start this after you complete the augmentin   Get Flu and RSV vaccines   Asthma-COPD overlap syndrome  Continue Vest therapy  Continue xopenox (levalbuterol ) every 4-6 hours as needed  DECREASE smoking.  Daily controller medication(s): continue Symbicort  160mcg 2 puffs twice a day with spacer and rinse mouth afterwards. Continue Spiriva  1.25mcg 2 puffs once a day. Continue Tezspire  injections every 4 weeks.  During upper respiratory infections/flares:  Start budesonide  (Pulmicort ) 0.5mg  nebulizer twice a day for 1-2 weeks until your breathing symptoms return to baseline.  Call us  for antibiotics  Pretreat with albuterol  2 puffs or albuterol  nebulizer.  If you need to use your albuterol  nebulizer machine back to back within 15-30 minutes with no relief then please go to the ER/urgent care for further evaluation.  May use levoalbuterol rescue inhaler 2 puffs or nebulizer every 4 to 6 hours as needed for shortness of breath, chest tightness, coughing, and wheezing. May use levoalbuterol rescue inhaler 2 puffs 5 to 15 minutes prior to strenuous physical activities. Monitor frequency of use.  Asthma control goals:  Full participation in all desired activities (may need albuterol  before activity) Albuterol  use two times or less a week on average (not counting use  with activity) Cough interfering with sleep two times or less a month Oral steroids no more than once a year No hospitalizations    Seasonal and perennial allergic rhinitis- 2021 bloodwork positive to cat, dog, grass, trees, ragweed.  Continue with Singulair  (montelukast ) 10mg  daily at night. Continue flonase  1 spray per nostril twice daily  Continue Atrovent  1 spray per nostril up 3 times a day  Nasal saline spray (i.e., Simply Saline) or nasal saline lavage (i.e., NeilMed) is recommended as needed and prior to medicated nasal sprays. Continue environmental control measures.   Gastroesophageal reflux disease Continue lifestyle and dietary modifications. Continue omeprazole 20mg  twice a day.   Follow up: 3 months   Thank you so much for letting me partake in your care today.  Don't hesitate to reach out if you have any additional concerns!  Kristi Springer, MD  [facility] [place]   Meds ordered this encounter  Medications   EPINEPHrine  (EPIPEN  2-PAK) 0.3 mg/0.3 mL IJ SOAJ injection    Sig: Use as needed for severe allergic reactions.    Dispense:  2 each    Refill:  3    Dispense Teva or Mylan brand only   levalbuterol  (XOPENEX  HFA) 45 MCG/ACT inhaler    Sig: Inhale 2 puffs into the lungs every 6 (six) hours as needed for wheezing.    Dispense:  15 g    Refill:  1   Olopatadine -Mometasone (RYALTRIS ) 665-25 MCG/ACT SUSP    Sig: Place 2 sprays into the nose in the morning and at bedtime.  Dispense:  29 g    Refill:  5   DISCONTD: amoxicillin -clavulanate (AUGMENTIN ) 875-125 MG tablet    Sig: Take 1 tablet by mouth 2 (two) times daily for 14 days.    Dispense:  28 tablet    Refill:  0   DISCONTD: fluconazole  (DIFLUCAN ) 150 MG tablet    Sig: Take one tablet onset of itching    Dispense:  1 tablet    Refill:  0   amoxicillin -clavulanate (AUGMENTIN ) 875-125 MG tablet    Sig: Take 1 tablet by mouth 2 (two) times daily for 14 days.    Dispense:  28  tablet    Refill:  0   fluconazole  (DIFLUCAN ) 150 MG tablet    Sig: Take one tablet onset of itching    Dispense:  1 tablet    Refill:  0   azithromycin  (ZITHROMAX ) 250 MG tablet    Sig: Take 250mg  on Monday, Wednesday, Friday    Dispense:  30 each    Refill:  5    Lab Orders         IgG, IgA, IgM         CMP14+EGFR     Diagnostics: Spirometry:  Tracings reviewed. Her effort: It was hard to get consistent efforts and there is a question as to whether this reflects a maximal maneuver. FVC: 1.80 L FEV1: 1.46 L, 68% predicted FEV1/FVC ratio: 81% Interpretation: Spirometry consistent with mixed obstructive and restrictive disease.  Please see scanned spirometry results for details.   Medication List:  Current Outpatient Medications  Medication Sig Dispense Refill   albuterol  (PROVENTIL ) (2.5 MG/3ML) 0.083% nebulizer solution USE ONE VIAL IN NEBULIZER EVERY 6 HOURS AS NEEDED FOR WHEEZING OR SHORTNESS OF BREATH 75 mL 1   albuterol  (VENTOLIN  HFA) 108 (90 Base) MCG/ACT inhaler Inhale 1-2 puffs into the lungs every 4 (four) hours as needed for wheezing or shortness of breath. INHALE TWO PUFFS INTO THE LUNGS EVERY 4 HOURS AS NEEDED FOR WHEEZING OR SHORTNESS OF BREATH 18 g 1   albuterol  (VENTOLIN  HFA) 108 (90 Base) MCG/ACT inhaler INHALE 2 PUFFS INTO THE LUNGS EVERY 4 HOURS AS NEEDED FOR WHEEZING OR SHORTNESS OF BREATH 8.5 g 3   ALLERGY RELIEF 10 MG tablet TAKE ONE TABLET BY MOUTH DAILY 30 tablet 5   amLODipine (NORVASC) 10 MG tablet Take 10 mg by mouth daily.     atorvastatin (LIPITOR) 10 MG tablet Take 10 mg by mouth daily.     budesonide  (PULMICORT ) 0.5 MG/2ML nebulizer solution USE 1 VIAL VIA NEBULIZER IN THE MORNING AND AT BEDTIME AS NEEDED 360 mL 1   budesonide -formoterol  (SYMBICORT ) 160-4.5 MCG/ACT inhaler INHALE 2 PUFFS INTO THE LUNGS TWICE DAILY 10.2 g 1   buPROPion (WELLBUTRIN XL) 150 MG 24 hr tablet Take 1 tablet by mouth daily.     busPIRone (BUSPAR) 10 MG tablet Take by mouth.      Calcium Carb-Cholecalciferol (CALCIUM CARBONATE-VITAMIN D3) 600-400 MG-UNIT TABS TAKE ONE TABLET BY MOUTH 2 TIMES A DAY     Carbinoxamine  Maleate 4 MG TABS Take 1 tablet (4 mg total) by mouth in the morning and at bedtime. 60 tablet 3   cetirizine  (ZYRTEC ) 10 MG tablet Take one tablet once daily for runny nose or itchy eyes 30 tablet 5   Cholecalciferol (VITAMIN D3) 50 MCG (2000 UT) TABS Take by mouth.     fluticasone  (FLONASE ) 50 MCG/ACT nasal spray USE ONE SPRAY IN BOTH NOSTRILS EVERY MORNING AND AT BEDTIME FOR DRAINAGE 16  g 5   hydrochlorothiazide (HYDRODIURIL) 25 MG tablet Take 25 mg by mouth daily.     Immune Globulin, Human, (PRIVIGEN IV) Inject 30 mg into the vein every 30 (thirty) days.     ipratropium (ATROVENT ) 0.03 % nasal spray Take 1-2 spray in each nostril twice a daily as needed for nasal drainage. 30 mL 5   ipratropium-albuterol  (DUONEB) 0.5-2.5 (3) MG/3ML SOLN Take 3 mLs by nebulization every 6 (six) hours as needed. 360 mL 5   loratadine  (CLARITIN ) 10 MG tablet TAKE 1 TABLET(10 MG) BY MOUTH DAILY 30 tablet 5   losartan (COZAAR) 50 MG tablet Take 1 tablet by mouth daily.     montelukast  (SINGULAIR ) 10 MG tablet Take 1 tablet (10 mg total) by mouth at bedtime. 30 tablet 11   Olopatadine  HCl 0.2 % SOLN Place one drop in each eye once a day as needed for itchy eyes 2.5 mL 5   omeprazole (PRILOSEC) 20 MG capsule Take 20 mg by mouth 2 (two) times daily.     Respiratory Therapy Supplies (NEBULIZER/TUBING/MOUTHPIECE) KIT Use with nebulizer 1 kit 1   SPIRIVA  RESPIMAT 1.25 MCG/ACT AERS INHALE TWO PUFFS INTO THE LUNGS EVERY DAY 4 g 5   tezepelumab -ekko (TEZSPIRE ) 210 MG/1. syringe Inject 1.91 mLs (210 mg total) into the skin every 28 (twenty-eight) days. 1.91 mL 11   traZODone (DESYREL) 50 MG tablet Take 50 mg by mouth at bedtime.     vitamin C (ASCORBIC ACID) 500 MG tablet Take 500 mg by mouth daily.     Zinc 50 MG CAPS Take by mouth.     albuterol  (VENTOLIN  HFA) 108 (90 Base)  MCG/ACT inhaler Inhale 2 puffs into the lungs every 4 (four) hours as needed for wheezing or shortness of breath. 17 g 11   amoxicillin -clavulanate (AUGMENTIN ) 875-125 MG tablet Take 1 tablet by mouth 2 (two) times daily for 14 days. 28 tablet 0   azithromycin  (ZITHROMAX ) 250 MG tablet Take 250mg  on Monday, Wednesday, Friday 30 each 5   EPINEPHrine  (EPIPEN  2-PAK) 0.3 mg/0.3 mL IJ SOAJ injection Use as needed for severe allergic reactions. 2 each 3   fluconazole  (DIFLUCAN ) 150 MG tablet Take one tablet onset of itching 1 tablet 0   levalbuterol  (XOPENEX  HFA) 45 MCG/ACT inhaler Inhale 2 puffs into the lungs every 6 (six) hours as needed for wheezing. 15 g 1   levocetirizine (XYZAL ) 5 MG tablet TAKE ONE TABLET BY MOUTH DAILY AS NEEDED FOR ALLERGIES 30 tablet 5   Olopatadine -Mometasone (RYALTRIS ) 665-25 MCG/ACT SUSP Place 2 sprays into the nose in the morning and at bedtime. 29 g 5   tezepelumab -ekko (TEZSPIRE ) 210 MG/1. syringe INJECT 210MG  SUBCUTANEOUSLY  EVERY 4 WEEKS TO BE ADMINISTERED BY A HEALTHCARE PROVIDER IN A  HEALTHCARE SETTING 1.91 mL 11   tezepelumab -ekko (TEZSPIRE ) 210 MG/1. syringe INJECT 210MG  SUBCUTANEOUSLY  EVERY 4 WEEKS 1.91 mL 11   TEZSPIRE  210 MG/1. syringe INJECT 1 SYRINGE SUBCUTANEOUSLY  EVERY 4 WEEKS 1.91 mL 11   VENTOLIN  HFA 108 (90 Base) MCG/ACT inhaler INHALE TWO PUFFS INTO THE LUNGS EVERY 4 HOURS AS NEEDED FOR WHEEZING OR SHORTNESS OF BREATH 18 g 11   Current Facility-Administered Medications  Medication Dose Route Frequency Provider Last Rate Last Admin   tezepelumab -ekko (TEZSPIRE ) 210 MG/1. syringe 210 mg  210 mg Subcutaneous Q28 days Luke Orlan HERO, DO   210 mg at 04/17/23 1433   tezepelumab -ekko (TEZSPIRE ) 210 MG/1. syringe 210 mg  210 mg Subcutaneous Q21 days Lorin Norris, MD  210 mg at 01/01/24 1500   Allergies: Allergies  Allergen Reactions   Doxycycline  Rash   Cyclobenzaprine    Hydrocortisone     Breaks out in hives with topical cream.     Sulfamethoxazole-Trimethoprim Swelling   Codeine Rash    Other reaction(s): RASH   I reviewed her past medical history, social history, family history, and environmental history and no significant changes have been reported from her previous visit.  ROS: All others negative except as noted per HPI.   Objective: BP 134/60   Pulse 70   Temp 97.7 F (36.5 C) (Temporal)   Resp 18   Wt 140 lb 1.6 oz (63.5 kg)   SpO2 98%   BMI 26.47 kg/m  Body mass index is 26.47 kg/m. General Appearance:  Alert, cooperative, no distress, appears stated age  Head:  Normocephalic, without obvious abnormality, atraumatic  Eyes:  Conjunctiva clear, EOM's intact  Nose: Nares normal, erythematous nasal mucosa with clear rhinorrhea, no visible anterior polyps, and septum midline,   Throat: Lips, tongue normal; teeth and gums normal, + cobblestoning  Neck: Supple, symmetrical  Lungs:   end-expiratory wheezing, Respirations unlabored, intermittent dry coughing (baseline pulmonary exam)   Heart:  regular rate and rhythm and no murmur, Appears well perfused  Extremities: No edema  Skin: Skin color, texture, turgor normal, no rashes or lesions on visualized portions of skin  Neurologic: No gross deficits   Previous notes and tests were reviewed. The plan was reviewed with the patient/family, and all questions/concerned were addressed.  It was my pleasure to see Bria today and participate in her care. Please feel free to contact me with any questions or concerns.  Sincerely,  Kristi Springer, MD  Allergy & Immunology  [facility]  [place] Office: [phone]

## 2024-01-04 ENCOUNTER — Ambulatory Visit: Payer: Self-pay | Admitting: Internal Medicine

## 2024-01-04 LAB — CMP14+EGFR
ALT: 15 IU/L (ref 0–32)
AST: 19 IU/L (ref 0–40)
Albumin: 4.1 g/dL (ref 3.9–4.9)
Alkaline Phosphatase: 103 IU/L (ref 44–121)
BUN/Creatinine Ratio: 22 (ref 12–28)
BUN: 16 mg/dL (ref 8–27)
Bilirubin Total: 0.3 mg/dL (ref 0.0–1.2)
CO2: 23 mmol/L (ref 20–29)
Calcium: 9.5 mg/dL (ref 8.7–10.3)
Chloride: 101 mmol/L (ref 96–106)
Creatinine, Ser: 0.72 mg/dL (ref 0.57–1.00)
Globulin, Total: 2.8 g/dL (ref 1.5–4.5)
Glucose: 107 mg/dL — ABNORMAL HIGH (ref 70–99)
Potassium: 3.4 mmol/L — ABNORMAL LOW (ref 3.5–5.2)
Sodium: 138 mmol/L (ref 134–144)
Total Protein: 6.9 g/dL (ref 6.0–8.5)
eGFR: 92 mL/min/1.73 (ref >59)

## 2024-01-04 LAB — IGG, IGA, IGM
IgA/Immunoglobulin A, Serum: 170 mg/dL (ref 87–352)
IgG (Immunoglobin G), Serum: 1350 mg/dL (ref 586–1602)
IgM (Immunoglobulin M), Srm: 82 mg/dL (ref 26–217)

## 2024-01-04 NOTE — Progress Notes (Signed)
 Immunglobulins are at goal, which is good news.   Potassium was slightly low, recommend discussing with primary care.

## 2024-01-08 ENCOUNTER — Telehealth: Payer: Self-pay | Admitting: Internal Medicine

## 2024-01-08 NOTE — Telephone Encounter (Signed)
Pt returning call about lab results. 

## 2024-01-08 NOTE — Telephone Encounter (Signed)
 Please see result note from 01/04/24

## 2024-01-08 NOTE — Telephone Encounter (Signed)
 Spoke with Kristi Allen she is aware of lab results and verbalized understanding and will contact her primary provider to discuss her potassium levels.

## 2024-01-16 ENCOUNTER — Other Ambulatory Visit: Payer: Self-pay | Admitting: *Deleted

## 2024-01-16 MED ORDER — LEVALBUTEROL TARTRATE 45 MCG/ACT IN AERO: 2.0000 | INHALATION_SPRAY | RESPIRATORY_TRACT | 1 refills | Status: DC | PRN

## 2024-01-16 NOTE — Telephone Encounter (Signed)
 Kristi Allen called asking witch inhaler to use albuterol  or levalbuterol . She gets chest pain and rapid heartrate with albuterol . Instructed her to use the Levabluterol as her rescue inhaler. Previously sent to [facility] pharmacy but she needs it to go to her [facility]. Rx has been sent.

## 2024-01-22 ENCOUNTER — Ambulatory Visit

## 2024-01-28 ENCOUNTER — Telehealth: Payer: Self-pay | Admitting: Internal Medicine

## 2024-01-28 ENCOUNTER — Ambulatory Visit (INDEPENDENT_AMBULATORY_CARE_PROVIDER_SITE_OTHER)

## 2024-01-28 DIAGNOSIS — J4550 Severe persistent asthma, uncomplicated: Secondary | ICD-10-CM

## 2024-01-28 NOTE — Telephone Encounter (Signed)
 Pt came in office and wanted to talk to Dr about her medication.

## 2024-01-30 ENCOUNTER — Other Ambulatory Visit: Payer: Self-pay | Admitting: Internal Medicine

## 2024-01-30 DIAGNOSIS — J4550 Severe persistent asthma, uncomplicated: Secondary | ICD-10-CM

## 2024-01-30 NOTE — Telephone Encounter (Signed)
 I tried calling patient, no answer. Left voicemail for return call. She received her last Tezspire  on 9/22 and there was a message from [place] [facility] That if her med was not there to use a sample.

## 2024-01-30 NOTE — Telephone Encounter (Signed)
 Which medication ?

## 2024-01-30 NOTE — Telephone Encounter (Signed)
 Kristi Allen, patient came in upset that her Tezspire  was not ordered and not here. She received a sample. I advised patient to contact Sha[facility]ra with any questions or concerns about her bio.

## 2024-01-31 NOTE — Telephone Encounter (Signed)
 I have talked to patient and explained this again to her

## 2024-02-19 ENCOUNTER — Ambulatory Visit (INDEPENDENT_AMBULATORY_CARE_PROVIDER_SITE_OTHER)

## 2024-02-19 DIAGNOSIS — J4550 Severe persistent asthma, uncomplicated: Secondary | ICD-10-CM | POA: Diagnosis not present

## 2024-03-11 ENCOUNTER — Ambulatory Visit

## 2024-03-11 DIAGNOSIS — J4550 Severe persistent asthma, uncomplicated: Secondary | ICD-10-CM | POA: Diagnosis not present

## 2024-03-12 ENCOUNTER — Other Ambulatory Visit: Payer: Self-pay | Admitting: Internal Medicine

## 2024-03-29 ENCOUNTER — Other Ambulatory Visit: Payer: Self-pay | Admitting: Internal Medicine

## 2024-03-31 ENCOUNTER — Other Ambulatory Visit: Payer: Self-pay | Admitting: Internal Medicine

## 2024-04-01 ENCOUNTER — Telehealth: Payer: Self-pay | Admitting: Internal Medicine

## 2024-04-01 ENCOUNTER — Ambulatory Visit (INDEPENDENT_AMBULATORY_CARE_PROVIDER_SITE_OTHER)

## 2024-04-01 DIAGNOSIS — J4550 Severe persistent asthma, uncomplicated: Secondary | ICD-10-CM | POA: Diagnosis not present

## 2024-04-01 MED ORDER — LORATADINE 10 MG PO TABS: 10.0000 mg | ORAL_TABLET | Freq: Every day | ORAL | 1 refills | Status: AC

## 2024-04-01 MED ORDER — FLUTICASONE PROPIONATE 50 MCG/ACT NA SUSP: 2.0000 | Freq: Every day | NASAL | 1 refills | Status: DC

## 2024-04-01 MED ORDER — IPRATROPIUM BROMIDE 0.03 % NA SOLN: NASAL | 1 refills | Status: AC

## 2024-04-01 MED ORDER — MONTELUKAST SODIUM 10 MG PO TABS: 10.0000 mg | ORAL_TABLET | Freq: Every day | ORAL | 1 refills | Status: DC

## 2024-04-01 NOTE — Telephone Encounter (Signed)
 Pt called in and requesting refills on flonase , signulair, and claritin  and she would like these sent to [facility] pharmacy and she also requesting a refill on atrovent  she would like that sent to [facility] on [address] in [place]

## 2024-04-04 ENCOUNTER — Other Ambulatory Visit: Payer: Self-pay | Admitting: Internal Medicine

## 2024-04-11 ENCOUNTER — Other Ambulatory Visit: Payer: Self-pay | Admitting: Family

## 2024-04-11 ENCOUNTER — Other Ambulatory Visit: Payer: Self-pay | Admitting: Internal Medicine

## 2024-04-14 ENCOUNTER — Other Ambulatory Visit: Payer: Self-pay | Admitting: Internal Medicine

## 2024-04-18 ENCOUNTER — Other Ambulatory Visit: Payer: Self-pay | Admitting: Internal Medicine

## 2024-04-21 ENCOUNTER — Other Ambulatory Visit: Payer: Self-pay | Admitting: Internal Medicine

## 2024-04-21 DIAGNOSIS — J4550 Severe persistent asthma, uncomplicated: Secondary | ICD-10-CM

## 2024-04-22 ENCOUNTER — Other Ambulatory Visit: Payer: Self-pay | Admitting: Family

## 2024-04-22 ENCOUNTER — Ambulatory Visit

## 2024-04-29 ENCOUNTER — Ambulatory Visit

## 2024-04-29 ENCOUNTER — Other Ambulatory Visit: Payer: Self-pay | Admitting: Internal Medicine

## 2024-05-02 ENCOUNTER — Other Ambulatory Visit: Payer: Self-pay | Admitting: Internal Medicine

## 2024-05-06 ENCOUNTER — Ambulatory Visit (INDEPENDENT_AMBULATORY_CARE_PROVIDER_SITE_OTHER)

## 2024-05-06 ENCOUNTER — Ambulatory Visit (INDEPENDENT_AMBULATORY_CARE_PROVIDER_SITE_OTHER): Admitting: Family Medicine

## 2024-05-06 VITALS — BP 122/60 | HR 81 | Temp 98.0°F | Resp 20 | Wt 136.5 lb

## 2024-05-06 DIAGNOSIS — J0181 Other acute recurrent sinusitis: Secondary | ICD-10-CM

## 2024-05-06 DIAGNOSIS — K219 Gastro-esophageal reflux disease without esophagitis: Secondary | ICD-10-CM

## 2024-05-06 DIAGNOSIS — Z720 Tobacco use: Secondary | ICD-10-CM

## 2024-05-06 DIAGNOSIS — J4489 Other specified chronic obstructive pulmonary disease: Secondary | ICD-10-CM

## 2024-05-06 DIAGNOSIS — D839 Common variable immunodeficiency, unspecified: Secondary | ICD-10-CM

## 2024-05-06 DIAGNOSIS — J4550 Severe persistent asthma, uncomplicated: Secondary | ICD-10-CM | POA: Diagnosis not present

## 2024-05-06 DIAGNOSIS — J4551 Severe persistent asthma with (acute) exacerbation: Secondary | ICD-10-CM | POA: Diagnosis not present

## 2024-05-06 MED ORDER — PREDNISONE 10 MG PO TABS: ORAL_TABLET | ORAL | 0 refills | Status: AC

## 2024-05-06 MED ORDER — AMOXICILLIN-POT CLAVULANATE 875-125 MG PO TABS: 1.0000 | ORAL_TABLET | Freq: Two times a day (BID) | ORAL | 0 refills | Status: DC

## 2024-05-06 MED ORDER — FLUCONAZOLE 150 MG PO TABS: ORAL_TABLET | ORAL | 0 refills | Status: DC

## 2024-05-06 MED ORDER — PREDNISONE 10 MG PO TABS: ORAL_TABLET | ORAL | 0 refills | Status: DC

## 2024-05-06 MED ORDER — FLUCONAZOLE 150 MG PO TABS: ORAL_TABLET | ORAL | 0 refills | Status: AC

## 2024-05-06 MED ORDER — AMOXICILLIN-POT CLAVULANATE 875-125 MG PO TABS: 1.0000 | ORAL_TABLET | Freq: Two times a day (BID) | ORAL | 0 refills | Status: AC

## 2024-05-06 NOTE — Patient Instructions (Signed)
 CVID (common variable immunodeficiency) with bronchiectasis Continue at home infusions once a month with Privigen. Keep track of infections. Last ABX in 8/25 Continue Azithromycin  250mg  on Monday, Wednesday and Friday  Get Flu and RSV vaccines   Asthma-COPD overlap syndrome with acute exacerbation Begin prednisone  10 mg tablets. Take 2 tablets twice a day for 3 days, then take 2 tablets once a day for 1 day, then take 1 tablet on the 5th day, then stop  Continue Vest therapy  Continue xopenox (levalbuterol ) every 4-6 hours as needed  DECREASE smoking.  Daily controller medication(s): continue Symbicort  160mcg 2 puffs twice a day with spacer and rinse mouth afterwards. Continue Spiriva  1.25mcg 2 puffs once a day. Continue Tezspire  injections every 4 weeks.  During upper respiratory infections/flares:  Start budesonide  (Pulmicort ) 0.5mg  nebulizer twice a day for 1-2 weeks until your breathing symptoms return to baseline. Pretreat with albuterol  2 puffs or albuterol  nebulizer.  If you need to use your albuterol  nebulizer machine back to back within 15-30 minutes with no relief then please go to the ER/urgent care for further evaluation.  May use levoalbuterol rescue inhaler 2 puffs or nebulizer every 4 to 6 hours as needed for shortness of breath, chest tightness, coughing, and wheezing. May use levoalbuterol rescue inhaler 2 puffs 5 to 15 minutes prior to strenuous physical activities. Monitor frequency of use.  Asthma control goals:  Full participation in all desired activities (may need albuterol  before activity) Albuterol  use two times or less a week on average (not counting use with activity) Cough interfering with sleep two times or less a month Oral steroids no more than once a year No hospitalizations    Seasonal and perennial allergic rhinitis- 2021 bloodwork positive to cat, dog, grass, trees, ragweed.  Continue with Singulair  (montelukast ) 10mg  daily at night. Continue flonase  1  spray per nostril twice daily  Continue Atrovent  1 spray per nostril up 3 times a day  Nasal saline spray (i.e., Simply Saline) or nasal saline lavage (i.e., NeilMed) is recommended as needed and prior to medicated nasal sprays. Continue environmental control measures.   Gastroesophageal reflux disease Continue lifestyle and dietary modifications. Continue omeprazole as previously prescribed   Acute bacterial sinusitis Begin Augmentin  875 mg twice a day for 10 days. Diflucan  if needed Hold azithromycin  until finished with Augmentin , then resume azithromycin  Monday, Wednesday, and Fridays  Follow up: 1 month or sooner if needed  Thank you for the opportunity to care for this patient.  Please do not hesitate to contact me with questions.  Arlean Mutter, FNP [facility]  [facility]

## 2024-05-06 NOTE — Progress Notes (Unsigned)
" ° °  [address] [place] [address] Dept: [phone]  FOLLOW UP NOTE  Patient ID: Kristi Allen, female    DOB: July 27, 1955  Age: 68 y.o. MRN: [id number] Date of Office Visit: 05/06/2024  Assessment  Chief Complaint: No chief complaint on file.  HPI Krysteena Tudor   Discussed the use of AI scribe software for clinical note transcription with the patient, who gave verbal consent to proceed.  History of Present Illness      Drug Allergies:  Allergies[1]  Physical Exam: There were no vitals taken for this visit.   Physical Exam  Diagnostics:    Assessment and Plan: No diagnosis found.  No orders of the defined types were placed in this encounter.   There are no Patient Instructions on file for this visit.  No follow-ups on file.    Thank you for the opportunity to care for this patient.  Please do not hesitate to contact me with questions.  Arlean Mutter, FNP [facility]            [1] Allergies Allergen Reactions   Doxycycline  Rash   Cyclobenzaprine    Hydrocortisone     Breaks out in hives with topical cream.    Sulfamethoxazole-Trimethoprim Swelling   Codeine Rash    Other reaction(s): RASH  "

## 2024-05-07 ENCOUNTER — Encounter: Payer: Self-pay | Admitting: Family Medicine

## 2024-05-07 DIAGNOSIS — J4551 Severe persistent asthma with (acute) exacerbation: Secondary | ICD-10-CM | POA: Insufficient documentation

## 2024-05-07 DIAGNOSIS — J0181 Other acute recurrent sinusitis: Secondary | ICD-10-CM | POA: Insufficient documentation

## 2024-05-20 ENCOUNTER — Other Ambulatory Visit: Payer: Self-pay | Admitting: Internal Medicine

## 2024-05-20 DIAGNOSIS — J4551 Severe persistent asthma with (acute) exacerbation: Secondary | ICD-10-CM

## 2024-05-27 ENCOUNTER — Ambulatory Visit

## 2024-05-28 ENCOUNTER — Ambulatory Visit (INDEPENDENT_AMBULATORY_CARE_PROVIDER_SITE_OTHER)

## 2024-05-28 DIAGNOSIS — J4550 Severe persistent asthma, uncomplicated: Secondary | ICD-10-CM | POA: Diagnosis not present

## 2024-06-04 ENCOUNTER — Other Ambulatory Visit: Payer: Self-pay | Admitting: Internal Medicine

## 2024-06-12 ENCOUNTER — Other Ambulatory Visit: Payer: Self-pay | Admitting: Internal Medicine

## 2024-06-18 ENCOUNTER — Ambulatory Visit
